# Patient Record
Sex: Female | Born: 1960 | Race: White | Hispanic: No | Marital: Married | Smoking: Current every day smoker
Health system: Southern US, Community
[De-identification: ages and names within clinical notes are randomized; demographics above are authoritative.]

## PROBLEM LIST (undated history)

## (undated) DIAGNOSIS — E039 Hypothyroidism, unspecified: Secondary | ICD-10-CM

## (undated) DIAGNOSIS — G5603 Carpal tunnel syndrome, bilateral upper limbs: Secondary | ICD-10-CM

## (undated) DIAGNOSIS — M1990 Unspecified osteoarthritis, unspecified site: Secondary | ICD-10-CM

## (undated) DIAGNOSIS — M8580 Other specified disorders of bone density and structure, unspecified site: Secondary | ICD-10-CM

## (undated) DIAGNOSIS — K760 Fatty (change of) liver, not elsewhere classified: Secondary | ICD-10-CM

## (undated) DIAGNOSIS — R519 Headache, unspecified: Secondary | ICD-10-CM

## (undated) DIAGNOSIS — Z923 Personal history of irradiation: Secondary | ICD-10-CM

## (undated) DIAGNOSIS — F419 Anxiety disorder, unspecified: Secondary | ICD-10-CM

## (undated) DIAGNOSIS — E785 Hyperlipidemia, unspecified: Secondary | ICD-10-CM

## (undated) DIAGNOSIS — E1142 Type 2 diabetes mellitus with diabetic polyneuropathy: Secondary | ICD-10-CM

## (undated) DIAGNOSIS — Z8489 Family history of other specified conditions: Secondary | ICD-10-CM

## (undated) DIAGNOSIS — I10 Essential (primary) hypertension: Secondary | ICD-10-CM

## (undated) DIAGNOSIS — G4730 Sleep apnea, unspecified: Secondary | ICD-10-CM

## (undated) DIAGNOSIS — K219 Gastro-esophageal reflux disease without esophagitis: Secondary | ICD-10-CM

## (undated) DIAGNOSIS — E1140 Type 2 diabetes mellitus with diabetic neuropathy, unspecified: Secondary | ICD-10-CM

## (undated) DIAGNOSIS — K2970 Gastritis, unspecified, without bleeding: Secondary | ICD-10-CM

## (undated) DIAGNOSIS — E119 Type 2 diabetes mellitus without complications: Secondary | ICD-10-CM

## (undated) DIAGNOSIS — R51 Headache: Secondary | ICD-10-CM

## (undated) DIAGNOSIS — C801 Malignant (primary) neoplasm, unspecified: Secondary | ICD-10-CM

## (undated) DIAGNOSIS — C73 Malignant neoplasm of thyroid gland: Secondary | ICD-10-CM

## (undated) DIAGNOSIS — G43909 Migraine, unspecified, not intractable, without status migrainosus: Secondary | ICD-10-CM

## (undated) HISTORY — PX: OTHER SURGICAL HISTORY: SHX169

## (undated) HISTORY — PX: TONSILLECTOMY: SUR1361

## (undated) HISTORY — PX: OOPHORECTOMY: SHX86

## (undated) HISTORY — PX: COLONOSCOPY: SHX174

## (undated) HISTORY — DX: Type 2 diabetes mellitus without complications: E11.9

## (undated) HISTORY — PX: THYROIDECTOMY: SHX17

## (undated) HISTORY — PX: NASAL SINUS SURGERY: SHX719

---

## 1999-03-31 ENCOUNTER — Other Ambulatory Visit: Admission: RE | Admit: 1999-03-31 | Discharge: 1999-03-31 | Payer: Self-pay | Admitting: *Deleted

## 2003-08-16 HISTORY — PX: ABDOMINAL HYSTERECTOMY: SHX81

## 2005-06-16 ENCOUNTER — Ambulatory Visit: Payer: Self-pay | Admitting: Family Medicine

## 2005-06-27 ENCOUNTER — Ambulatory Visit: Payer: Self-pay | Admitting: Family Medicine

## 2005-10-25 ENCOUNTER — Ambulatory Visit: Payer: Self-pay | Admitting: Psychiatry

## 2006-08-15 DIAGNOSIS — C73 Malignant neoplasm of thyroid gland: Secondary | ICD-10-CM

## 2006-08-15 HISTORY — DX: Malignant neoplasm of thyroid gland: C73

## 2007-05-22 ENCOUNTER — Ambulatory Visit: Payer: Self-pay | Admitting: Otolaryngology

## 2007-07-09 ENCOUNTER — Ambulatory Visit: Payer: Self-pay | Admitting: Otolaryngology

## 2007-08-13 ENCOUNTER — Ambulatory Visit: Payer: Self-pay | Admitting: Otolaryngology

## 2007-09-10 ENCOUNTER — Ambulatory Visit: Payer: Self-pay | Admitting: Otolaryngology

## 2007-09-14 ENCOUNTER — Ambulatory Visit: Payer: Self-pay | Admitting: Otolaryngology

## 2009-03-30 ENCOUNTER — Ambulatory Visit: Payer: Self-pay | Admitting: Gastroenterology

## 2009-05-04 ENCOUNTER — Ambulatory Visit: Payer: Self-pay | Admitting: Gastroenterology

## 2010-09-24 ENCOUNTER — Ambulatory Visit: Payer: Self-pay | Admitting: Otolaryngology

## 2010-10-08 ENCOUNTER — Ambulatory Visit: Payer: Self-pay | Admitting: Otolaryngology

## 2011-06-14 ENCOUNTER — Ambulatory Visit: Payer: Self-pay | Admitting: Gastroenterology

## 2011-06-16 LAB — PATHOLOGY REPORT

## 2011-10-20 ENCOUNTER — Ambulatory Visit: Payer: Self-pay | Admitting: Family Medicine

## 2012-12-06 ENCOUNTER — Ambulatory Visit: Payer: Self-pay | Admitting: Family Medicine

## 2013-08-28 ENCOUNTER — Emergency Department: Payer: Self-pay | Admitting: Emergency Medicine

## 2013-08-28 LAB — COMPREHENSIVE METABOLIC PANEL
Albumin: 4 g/dL (ref 3.4–5.0)
Alkaline Phosphatase: 71 U/L
Anion Gap: 4 — ABNORMAL LOW (ref 7–16)
BUN: 13 mg/dL (ref 7–18)
Bilirubin,Total: 0.4 mg/dL (ref 0.2–1.0)
Calcium, Total: 9.3 mg/dL (ref 8.5–10.1)
Chloride: 105 mmol/L (ref 98–107)
Co2: 28 mmol/L (ref 21–32)
Creatinine: 0.78 mg/dL (ref 0.60–1.30)
EGFR (African American): >60
EGFR (Non-African Amer.): >60
Glucose: 105 mg/dL — ABNORMAL HIGH (ref 65–99)
Osmolality: 274 (ref 275–301)
Potassium: 3.8 mmol/L (ref 3.5–5.1)
SGOT(AST): 29 U/L (ref 15–37)
SGPT (ALT): 45 U/L (ref 12–78)
Sodium: 137 mmol/L (ref 136–145)
Total Protein: 7.8 g/dL (ref 6.4–8.2)

## 2013-08-28 LAB — URINALYSIS, COMPLETE
Bacteria: NONE SEEN
Bilirubin,UR: NEGATIVE
Blood: NEGATIVE
Glucose,UR: NEGATIVE mg/dL (ref 0–75)
Ketone: NEGATIVE
Leukocyte Esterase: NEGATIVE
Nitrite: NEGATIVE
Ph: 7 (ref 4.5–8.0)
Protein: NEGATIVE
RBC,UR: 1 /HPF (ref 0–5)
Specific Gravity: 1.016 (ref 1.003–1.030)
Squamous Epithelial: 3
WBC UR: <1 /HPF (ref 0–5)

## 2013-08-28 LAB — CBC
HCT: 37.4 % (ref 35.0–47.0)
HGB: 12.7 g/dL (ref 12.0–16.0)
MCH: 30.3 pg (ref 26.0–34.0)
MCHC: 34 g/dL (ref 32.0–36.0)
MCV: 89 fL (ref 80–100)
Platelet: 217 10*3/uL (ref 150–440)
RBC: 4.2 10*6/uL (ref 3.80–5.20)
RDW: 15 % — ABNORMAL HIGH (ref 11.5–14.5)
WBC: 7 10*3/uL (ref 3.6–11.0)

## 2013-08-30 ENCOUNTER — Emergency Department: Payer: Self-pay | Admitting: Emergency Medicine

## 2013-10-24 ENCOUNTER — Other Ambulatory Visit: Payer: Self-pay | Admitting: Neurosurgery

## 2013-10-24 DIAGNOSIS — M545 Low back pain, unspecified: Secondary | ICD-10-CM

## 2013-11-22 ENCOUNTER — Ambulatory Visit
Admission: RE | Admit: 2013-11-22 | Discharge: 2013-11-22 | Disposition: A | Discharge status: Home / Self Care | Payer: BC Managed Care – PPO | Source: Ambulatory Visit | Attending: Neurosurgery | Admitting: Neurosurgery

## 2013-11-22 VITALS — BP 136/89 | HR 72

## 2013-11-22 DIAGNOSIS — M545 Low back pain, unspecified: Secondary | ICD-10-CM

## 2013-11-22 MED ORDER — DIAZEPAM 5 MG PO TABS
10.0000 mg | ORAL_TABLET | Freq: Once | ORAL | Status: AC
  Administered 2013-11-22: 10 mg via ORAL

## 2013-11-22 MED ORDER — IOHEXOL 180 MG/ML  SOLN
15.0000 mL | Freq: Once | INTRAMUSCULAR | Status: AC | PRN
  Administered 2013-11-22: 15 mL via INTRATHECAL

## 2013-11-22 NOTE — Progress Notes (Signed)
Pt states she had been off cymbalta since Tuesday. Discharge instructions explained to pt;

## 2013-11-22 NOTE — Discharge Instructions (Signed)
Myelogram Discharge Instructions  1. Go home and rest quietly for the next 24 hours.  It is important to lie flat for the next 24 hours.  Get up only to go to the restroom.  You may lie in the bed or on a couch on your back, your stomach, your left side or your right side.  You may have one pillow under your head.  You may have pillows between your knees while you are on your side or under your knees while you are on your back.  2. DO NOT drive today.  Recline the seat as far back as it will go, while still wearing your seat belt, on the way home.  3. You may get up to go to the bathroom as needed.  You may sit up for 10 minutes to eat.  You may resume your normal diet and medications unless otherwise indicated.  Drink lots of extra fluids today and tomorrow.  4. The incidence of headache, nausea, or vomiting is about 5% (one in 20 patients).  If you develop a headache, lie flat and drink plenty of fluids until the headache goes away.  Caffeinated beverages may be helpful.  If you develop severe nausea and vomiting or a headache that does not go away with flat bed rest, call [phone].  5. You may resume normal activities after your 24 hours of bed rest is over; however, do not exert yourself strongly or do any heavy lifting tomorrow. If when you get up you have a headache when standing, go back to bed and force fluids for another 24 hours.  6. Call your physician for a follow-up appointment.  The results of your myelogram will be sent directly to your physician by the following day.  7. If you have any questions or if complications develop after you arrive home, please call [phone].  Discharge instructions have been explained to the patient.  The patient, or the person responsible for the patient, fully understands these instructions.      May resume cymbalta on November 23, 2013, after 11:00 am.

## 2014-02-13 ENCOUNTER — Ambulatory Visit: Payer: Self-pay | Admitting: Family Medicine

## 2015-02-17 ENCOUNTER — Other Ambulatory Visit: Payer: Self-pay | Admitting: Family Medicine

## 2015-02-17 DIAGNOSIS — Z1231 Encounter for screening mammogram for malignant neoplasm of breast: Secondary | ICD-10-CM

## 2015-03-16 ENCOUNTER — Ambulatory Visit
Admission: RE | Admit: 2015-03-16 | Discharge: 2015-03-16 | Disposition: A | Discharge status: Home / Self Care | Payer: BLUE CROSS/BLUE SHIELD | Source: Ambulatory Visit | Attending: Family Medicine | Admitting: Family Medicine

## 2015-03-16 DIAGNOSIS — Z1231 Encounter for screening mammogram for malignant neoplasm of breast: Secondary | ICD-10-CM

## 2015-03-16 HISTORY — DX: Personal history of irradiation: Z92.3

## 2015-03-16 HISTORY — DX: Malignant (primary) neoplasm, unspecified: C80.1

## 2015-09-08 ENCOUNTER — Encounter: Payer: Self-pay | Admitting: *Deleted

## 2015-09-17 ENCOUNTER — Encounter: Payer: Self-pay | Admitting: General Surgery

## 2015-09-17 ENCOUNTER — Ambulatory Visit (INDEPENDENT_AMBULATORY_CARE_PROVIDER_SITE_OTHER): Payer: BLUE CROSS/BLUE SHIELD | Admitting: General Surgery

## 2015-09-17 VITALS — BP 130/84 | HR 80 | Resp 14 | Ht 66.0 in | Wt 230.0 lb

## 2015-09-17 DIAGNOSIS — K6289 Other specified diseases of anus and rectum: Secondary | ICD-10-CM | POA: Diagnosis not present

## 2015-09-17 DIAGNOSIS — M7918 Myalgia, other site: Secondary | ICD-10-CM

## 2015-09-17 DIAGNOSIS — M791 Myalgia: Secondary | ICD-10-CM | POA: Diagnosis not present

## 2015-09-17 LAB — POC HEMOCCULT BLD/STL (OFFICE/1-CARD/DIAGNOSTIC)
Card #1 Date: NEGATIVE
Fecal Occult Blood, POC: NEGATIVE

## 2015-09-17 NOTE — Patient Instructions (Signed)
Follow up appointment to be announced.  

## 2015-09-17 NOTE — Progress Notes (Signed)
Patient ID: Sherri Dickerson, female   DOB: 1961/04/07, 55 y.o.   MRN: [id number]  Chief Complaint  Patient presents with  . Other    hemorrhoids    HPI Sherri Dickerson is a 55 y.o. female.  She states she is having rectal/gluteal pain but no bleeding.  Movements are occurring daily with help of miralax. She states she has had pain on the left side of the rectum on a near constant basis for the past month. She did not find that the location of the pain correlated with what she thought was an external hemorrhoid. While she's had trouble with hemorrhoids in the past, she has had no recent bleeding. She has tried preparation H as well as Desitin cream without relief.  The patient reports that she has pain with long periods of sitting. There is no radiation from the left gluteal area, adjacent to the perianal tissue during these episodes. She describes the pain is being that of burning or a scalded sensation. She does not find that bowel movements affect the pain. During that month that this pain has been present she has attempted intercourse on only one occurrence and found that this was uncomfortable, although the discomfort was not within the vagina.  The patient denies previous similar episodes of pain in this area, although she does have a history of back problems. She has undergone epidural narcotic administration in the past, most recently in November 2016 when she underwent an L5/S1 injection for radicular pain and back pain. The present left gluteal discomfort occurred shortly after, but not immediately after this injection patient reports her back pain improved but the radicular pain to the lateral inferior aspect of the thigh was not improved.   I personally reviewed the patient's history. HPI  Past Medical History  Diagnosis Date  . Status post radiation therapy     thyroid  . Diabetes mellitus without complication ([place])   . Cancer [facility]) 2012?    thyroid    Past Surgical History   Procedure Laterality Date  . Thyroidectomy  2012?    Dr Kathyrn Sheriff  . Colonoscopy  2013?    Dr Donnella Sham  . Abdominal hysterectomy  2005    Family History  Problem Relation Age of Onset  . Breast cancer Neg Hx     Social History Social History  Substance Use Topics  . Smoking status: Current Some Day Smoker    Types: Cigarettes  . Smokeless tobacco: Never Used     Comment: only 2-3 cigarettes a year.  . Alcohol Use: 0.0 oz/week    0 Standard drinks or equivalent per week    Allergies  Allergen Reactions  . Morphine And Related     Current Outpatient Prescriptions  Medication Sig Dispense Refill  . aspirin EC 81 MG tablet Take by mouth.    Marland Kitchen atorvastatin (LIPITOR) 40 MG tablet TK 1 T PO QPM  0  . Blood Glucose Monitoring Suppl (FIFTY50 GLUCOSE METER 2.0) w/Device KIT Check fasting sugar once daily.  E11.9    . Cyanocobalamin (RA VITAMIN B-12 TR) 1000 MCG TBCR Take by mouth.    . diazepam (VALIUM) 5 MG tablet Take by mouth.    . gabapentin (NEURONTIN) 300 MG capsule TK ONE C PO  QAM AND 2 CS Q NIGHT  11  . lisinopril-hydrochlorothiazide (PRINZIDE,ZESTORETIC) 10-12.5 MG tablet TK 1 T PO QD  4  . metFORMIN (GLUCOPHAGE) 500 MG tablet   12  . Multiple Vitamin (MULTI-VITAMINS) TABS Take  by mouth.    . naproxen (NAPROSYN) 500 MG tablet 1 po bid with food and water when needed.  Do not take daily.    Marland Kitchen oxyCODONE-acetaminophen (PERCOCET/ROXICET) 5-325 MG tablet Take by mouth.    . RABEprazole (ACIPHEX) 20 MG tablet   0  . SYNTHROID 125 MCG tablet   4  . venlafaxine XR (EFFEXOR-XR) 150 MG 24 hr capsule   11  . Vitamin D, Ergocalciferol, (DRISDOL) 50000 units CAPS capsule Take by mouth.     No current facility-administered medications for this visit.    Review of Systems Review of Systems  Constitutional: Negative.   Respiratory: Negative.   Cardiovascular: Negative.   Gastrointestinal: Positive for constipation. Negative for nausea, diarrhea and blood in stool.    Blood  pressure 130/84, pulse 80, resp. rate 14, height _0  (1.676 m), weight 230 lb (104.327 kg).  Physical Exam Physical Exam  Constitutional: She is oriented to person, place, and time. She appears well-developed and well-nourished.  HENT:  Mouth/Throat: Oropharynx is clear and moist.  Eyes: Conjunctivae are normal. No scleral icterus.  Neck: Neck supple.  Cardiovascular: Normal rate, regular rhythm and normal heart sounds.   Pulmonary/Chest: Effort normal and breath sounds normal.  Genitourinary: Rectal exam shows no fissure, no mass, no tenderness and anal tone normal. Guaiac negative stool.     Anterior anal tag  Lymphadenopathy:    She has no cervical adenopathy.  Neurological: She is alert and oriented to person, place, and time.  Skin: Skin is warm and dry.  Psychiatric: Her behavior is normal.    Data Reviewed The 06/16/2015 epidural injection note by Sharlet Salina, D.O. was reviewed.  Office notes from September 2016 to the present were reviewed. No mention is made of perianal pain  Anoscopy was completed: No abnormality of the visualized lower rectal mucosa. Small, 2 mm internal hemorrhoids noted without bleeding. External skin tag anteriorly without irritation or bleeding.    Assessment    Gluteal pain/hyper-sensitivity without obvious etiology.     Plan    The patient's report of a burning sensation would suggest nerve irritation, and in this location would be in the S3/-4 dermatome. No maneuvers during today's exam elicited any objective physical findings. While the patient reported the pain developed shortly after her epidural, it's difficult to explain how this would've had any effect on these lower nerve roots.    I will review this case with my colleagues and notify the patient if any additional maneuvers can be suggested.   Patient to return as needed PCP: Dr Erle Crocker This information has been scribed by Karie Fetch RNBC.  Robert Bellow 09/18/2015,  1:27 PM

## 2015-09-18 DIAGNOSIS — M7918 Myalgia, other site: Secondary | ICD-10-CM | POA: Insufficient documentation

## 2015-09-18 DIAGNOSIS — K6289 Other specified diseases of anus and rectum: Secondary | ICD-10-CM | POA: Insufficient documentation

## 2015-09-24 ENCOUNTER — Telehealth: Payer: Self-pay | Admitting: *Deleted

## 2015-09-24 NOTE — Telephone Encounter (Signed)
-----   Message from Robert Bellow, MD sent at 09/24/2015  4:32 PM EST ----- These notify the patient that I reviewed her case with another physician. I really don't have any good idea why she is having the burning discomfort on the perineum. This would suggest possible nerve irritation, it's difficult to correlate this with the spinal steroid injection that had occurred shortly before onset. I would encourage her to touch base with her pain management specialist as I see no primary pathology involving the anus, rectum or perineum. Thank you

## 2015-09-25 NOTE — Telephone Encounter (Signed)
Notified patient as instructed, patient pleased., patient agrees  

## 2015-10-12 ENCOUNTER — Other Ambulatory Visit: Payer: Self-pay | Admitting: Physical Medicine and Rehabilitation

## 2015-10-12 DIAGNOSIS — M5416 Radiculopathy, lumbar region: Secondary | ICD-10-CM

## 2015-10-28 ENCOUNTER — Ambulatory Visit
Admission: RE | Admit: 2015-10-28 | Discharge: 2015-10-28 | Disposition: A | Discharge status: Home / Self Care | Payer: BLUE CROSS/BLUE SHIELD | Source: Ambulatory Visit | Attending: Physical Medicine and Rehabilitation | Admitting: Physical Medicine and Rehabilitation

## 2015-10-28 DIAGNOSIS — M4806 Spinal stenosis, lumbar region: Secondary | ICD-10-CM | POA: Insufficient documentation

## 2015-10-28 DIAGNOSIS — M5416 Radiculopathy, lumbar region: Secondary | ICD-10-CM

## 2016-03-29 ENCOUNTER — Other Ambulatory Visit: Payer: Self-pay | Admitting: Family Medicine

## 2016-03-29 DIAGNOSIS — Z1231 Encounter for screening mammogram for malignant neoplasm of breast: Secondary | ICD-10-CM

## 2016-04-13 ENCOUNTER — Ambulatory Visit: Payer: BLUE CROSS/BLUE SHIELD | Attending: Family Medicine

## 2016-07-19 ENCOUNTER — Ambulatory Visit
Admission: RE | Admit: 2016-07-19 | Discharge: 2016-07-19 | Disposition: A | Discharge status: Home / Self Care | Payer: BLUE CROSS/BLUE SHIELD | Source: Ambulatory Visit | Attending: Neurosurgery | Admitting: Neurosurgery

## 2016-07-19 ENCOUNTER — Encounter
Admission: RE | Admit: 2016-07-19 | Discharge: 2016-07-19 | Disposition: A | Discharge status: Home / Self Care | Payer: BLUE CROSS/BLUE SHIELD | Source: Ambulatory Visit | Attending: Neurosurgery | Admitting: Neurosurgery

## 2016-07-19 DIAGNOSIS — Z01818 Encounter for other preprocedural examination: Secondary | ICD-10-CM | POA: Insufficient documentation

## 2016-07-19 DIAGNOSIS — E119 Type 2 diabetes mellitus without complications: Secondary | ICD-10-CM | POA: Insufficient documentation

## 2016-07-19 DIAGNOSIS — I10 Essential (primary) hypertension: Secondary | ICD-10-CM | POA: Insufficient documentation

## 2016-07-19 DIAGNOSIS — Z01810 Encounter for preprocedural cardiovascular examination: Secondary | ICD-10-CM | POA: Insufficient documentation

## 2016-07-19 DIAGNOSIS — R9431 Abnormal electrocardiogram [ECG] [EKG]: Secondary | ICD-10-CM | POA: Diagnosis not present

## 2016-07-19 HISTORY — DX: Hypothyroidism, unspecified: E03.9

## 2016-07-19 HISTORY — DX: Essential (primary) hypertension: I10

## 2016-07-19 HISTORY — DX: Headache, unspecified: R51.9

## 2016-07-19 HISTORY — DX: Sleep apnea, unspecified: G47.30

## 2016-07-19 HISTORY — DX: Headache: R51

## 2016-07-19 HISTORY — DX: Hyperlipidemia, unspecified: E78.5

## 2016-07-19 HISTORY — DX: Gastritis, unspecified, without bleeding: K29.70

## 2016-07-19 HISTORY — DX: Unspecified osteoarthritis, unspecified site: M19.90

## 2016-07-19 HISTORY — DX: Anxiety disorder, unspecified: F41.9

## 2016-07-19 LAB — CBC
HCT: 37.3 % (ref 35.0–47.0)
Hemoglobin: 12.5 g/dL (ref 12.0–16.0)
MCH: 32 pg (ref 26.0–34.0)
MCHC: 33.6 g/dL (ref 32.0–36.0)
MCV: 95.1 fL (ref 80.0–100.0)
Platelets: 246 10*3/uL (ref 150–440)
RBC: 3.92 MIL/uL (ref 3.80–5.20)
RDW: 15.5 % — ABNORMAL HIGH (ref 11.5–14.5)
WBC: 8.7 10*3/uL (ref 3.6–11.0)

## 2016-07-19 LAB — PROTIME-INR
INR: 0.92
Prothrombin Time: 12.4 seconds (ref 11.4–15.2)

## 2016-07-19 LAB — DIFFERENTIAL
Basophils Absolute: 0 10*3/uL (ref 0–0.1)
Basophils Relative: 0 %
Eosinophils Absolute: 0.1 10*3/uL (ref 0–0.7)
Eosinophils Relative: 2 %
Lymphocytes Relative: 39 %
Lymphs Abs: 3.4 10*3/uL (ref 1.0–3.6)
Monocytes Absolute: 0.6 10*3/uL (ref 0.2–0.9)
Monocytes Relative: 7 %
Neutro Abs: 4.6 10*3/uL (ref 1.4–6.5)
Neutrophils Relative %: 52 %

## 2016-07-19 LAB — URINALYSIS, ROUTINE W REFLEX MICROSCOPIC
Bacteria, UA: NONE SEEN
Bilirubin Urine: NEGATIVE
Glucose, UA: NEGATIVE mg/dL
Hgb urine dipstick: NEGATIVE
Ketones, ur: NEGATIVE mg/dL
Nitrite: NEGATIVE
Protein, ur: NEGATIVE mg/dL
RBC / HPF: NONE SEEN RBC/hpf (ref 0–5)
Specific Gravity, Urine: 1.02 (ref 1.005–1.030)
pH: 5 (ref 5.0–8.0)

## 2016-07-19 LAB — BASIC METABOLIC PANEL
Anion gap: 7 (ref 5–15)
BUN: 14 mg/dL (ref 6–20)
CO2: 29 mmol/L (ref 22–32)
Calcium: 9.7 mg/dL (ref 8.9–10.3)
Chloride: 105 mmol/L (ref 101–111)
Creatinine, Ser: 0.69 mg/dL (ref 0.44–1.00)
GFR calc Af Amer: >60 mL/min (ref >60)
GFR calc non Af Amer: >60 mL/min (ref >60)
Glucose, Bld: 102 mg/dL — ABNORMAL HIGH (ref 65–99)
Potassium: 3.8 mmol/L (ref 3.5–5.1)
Sodium: 141 mmol/L (ref 135–145)

## 2016-07-19 LAB — SURGICAL PCR SCREEN
MRSA, PCR: NEGATIVE
Staphylococcus aureus: POSITIVE — AB

## 2016-07-19 LAB — APTT: aPTT: 25 seconds (ref 24–36)

## 2016-07-19 NOTE — Patient Instructions (Signed)
  Your procedure is scheduled [id number] Dec. 11 , 2017. Report to Same Day Surgery. To find out your arrival time please call [phone] between 1PM - 3PM on Friday Dec.8, 2017 .  Remember: Instructions that are not followed completely may result in serious medical risk, up to and including death, or upon the discretion of your surgeon and anesthesiologist your surgery may need to be rescheduled.    _x___ 1. Do not eat food or drink liquids after midnight. No gum chewing or hard candies.     _x___ 2. No Alcohol for 24 hours before or after surgery.   ____ 3. Bring all medications with you on the day of surgery if instructed.    __x__ 4. Notify your doctor if there is any change in your medical condition     (cold, fever, infections).    __x___ 5. No smoking 24 hours prior to surgery.     Do not wear jewelry, make-up, hairpins, clips or nail polish.  Do not wear lotions, powders, or perfumes.   Do not shave 48 hours prior to surgery. Men may shave face and neck.  Do not bring valuables to the hospital.    [facility] is not responsible for any belongings or valuables.               Contacts, dentures or bridgework may not be worn into surgery.  Leave your suitcase in the car. After surgery it may be brought to your room.  For patients admitted to the hospital, discharge time is determined by your treatment team.   Patients discharged the day of surgery will not be allowed to drive home.    Please read over the following fact sheets that you were given:   [facility] Preparing for Surgery  _x___ Take these medicines the morning of surgery with A SIP OF WATER:    1. gabapentin (NEURONTIN)  2. RABEprazole (ACIPHEX)   3. SYNTHROID  4. venlafaxine XR (EFFEXOR-XR)  5. oxyCODONE-acetaminophen (PERCOCET/ROXICET)  optional    ____ Fleet Enema (as directed)   _x___ Use CHG Soap as directed on instruction sheet  ____ Use inhalers on the day of surgery and bring to hospital day of  surgery  _x___ Stop metformin 2 days prior to surgery    ____ Take 1/2 of usual insulin dose the night before surgery and none on the morning of surgery.   _x___ Stop aspirin per Dr. Jonathon Jordan instructions.  _x___ Stop Anti-inflammatories such as Advil, Aleve, Ibuprofen, Motrin, Naproxen, Naprosyn, Goodies powders or aspirin products. OK to take Tylenol or Percocet.   ____ Stop supplements: Cyanocobalamin (RA VITAMIN B-12 TR)  until after surgery.    _x___ Bring C-Pap to the hospital.

## 2016-07-20 NOTE — Pre-Procedure Instructions (Signed)
Urine analysis and positive staph aureus sent to Dr. Lacinda Axon for review.  Asked if Dr. Lacinda Axon wanted any treatment for positive staph aureus?

## 2016-07-24 MED ORDER — VANCOMYCIN HCL IN DEXTROSE 1-5 GM/200ML-% IV SOLN
1000.0000 mg | Freq: Once | INTRAVENOUS | Status: AC
  Administered 2016-07-25: 1000 mg via INTRAVENOUS

## 2016-07-25 ENCOUNTER — Ambulatory Visit
Admission: RE | Admit: 2016-07-25 | Discharge: 2016-07-25 | Disposition: A | Discharge status: Home / Self Care | Payer: BLUE CROSS/BLUE SHIELD | Source: Ambulatory Visit | Attending: Neurosurgery | Admitting: Neurosurgery

## 2016-07-25 ENCOUNTER — Ambulatory Visit: Payer: BLUE CROSS/BLUE SHIELD

## 2016-07-25 ENCOUNTER — Encounter
Admission: RE | Disposition: A | Discharge status: Home / Self Care | Payer: Self-pay | Source: Ambulatory Visit | Attending: Neurosurgery

## 2016-07-25 ENCOUNTER — Ambulatory Visit: Payer: BLUE CROSS/BLUE SHIELD | Admitting: Anesthesiology

## 2016-07-25 ENCOUNTER — Encounter: Payer: Self-pay | Admitting: *Deleted

## 2016-07-25 DIAGNOSIS — F419 Anxiety disorder, unspecified: Secondary | ICD-10-CM | POA: Insufficient documentation

## 2016-07-25 DIAGNOSIS — F17210 Nicotine dependence, cigarettes, uncomplicated: Secondary | ICD-10-CM | POA: Insufficient documentation

## 2016-07-25 DIAGNOSIS — K219 Gastro-esophageal reflux disease without esophagitis: Secondary | ICD-10-CM | POA: Diagnosis not present

## 2016-07-25 DIAGNOSIS — M48061 Spinal stenosis, lumbar region without neurogenic claudication: Secondary | ICD-10-CM | POA: Diagnosis present

## 2016-07-25 DIAGNOSIS — E669 Obesity, unspecified: Secondary | ICD-10-CM | POA: Insufficient documentation

## 2016-07-25 DIAGNOSIS — Z85850 Personal history of malignant neoplasm of thyroid: Secondary | ICD-10-CM | POA: Insufficient documentation

## 2016-07-25 DIAGNOSIS — E785 Hyperlipidemia, unspecified: Secondary | ICD-10-CM | POA: Diagnosis not present

## 2016-07-25 DIAGNOSIS — Z419 Encounter for procedure for purposes other than remedying health state, unspecified: Secondary | ICD-10-CM

## 2016-07-25 DIAGNOSIS — E119 Type 2 diabetes mellitus without complications: Secondary | ICD-10-CM | POA: Diagnosis not present

## 2016-07-25 DIAGNOSIS — G4730 Sleep apnea, unspecified: Secondary | ICD-10-CM | POA: Diagnosis not present

## 2016-07-25 DIAGNOSIS — M4726 Other spondylosis with radiculopathy, lumbar region: Secondary | ICD-10-CM | POA: Insufficient documentation

## 2016-07-25 DIAGNOSIS — Z6836 Body mass index (BMI) 36.0-36.9, adult: Secondary | ICD-10-CM | POA: Insufficient documentation

## 2016-07-25 DIAGNOSIS — I10 Essential (primary) hypertension: Secondary | ICD-10-CM | POA: Insufficient documentation

## 2016-07-25 HISTORY — PX: FORAMINOTOMY 2 LEVEL: SHX5836

## 2016-07-25 HISTORY — PX: LUMBAR LAMINECTOMY/DECOMPRESSION MICRODISCECTOMY: SHX5026

## 2016-07-25 LAB — GLUCOSE, CAPILLARY: Glucose-Capillary: 128 mg/dL — ABNORMAL HIGH (ref 65–99)

## 2016-07-25 SURGERY — LUMBAR LAMINECTOMY/DECOMPRESSION MICRODISCECTOMY 2 LEVELS
Anesthesia: General | Site: Back | Laterality: Left | Wound class: Clean

## 2016-07-25 MED ORDER — DEXAMETHASONE SODIUM PHOSPHATE 10 MG/ML IJ SOLN
INTRAMUSCULAR | Status: DC | PRN
  Administered 2016-07-25: 8 mg via INTRAVENOUS

## 2016-07-25 MED ORDER — FENTANYL CITRATE (PF) 100 MCG/2ML IJ SOLN
25.0000 ug | INTRAMUSCULAR | Status: AC | PRN
  Administered 2016-07-25 (×6): 25 ug via INTRAVENOUS

## 2016-07-25 MED ORDER — ONDANSETRON HCL 4 MG/2ML IJ SOLN
4.0000 mg | Freq: Once | INTRAMUSCULAR | Status: AC | PRN
  Administered 2016-07-25: 4 mg via INTRAVENOUS

## 2016-07-25 MED ORDER — ONDANSETRON HCL 4 MG/2ML IJ SOLN
INTRAMUSCULAR | Status: AC
  Filled 2016-07-25: qty 2

## 2016-07-25 MED ORDER — SODIUM CHLORIDE 0.9 % IV SOLN
INTRAVENOUS | Status: DC | PRN
  Administered 2016-07-25: 12:00:00 via INTRAVENOUS

## 2016-07-25 MED ORDER — SODIUM CHLORIDE 0.9 % IJ SOLN
INTRAMUSCULAR | Status: AC
  Filled 2016-07-25: qty 10

## 2016-07-25 MED ORDER — ACETAMINOPHEN 10 MG/ML IV SOLN
INTRAVENOUS | Status: DC | PRN
  Administered 2016-07-25: 1000 mg via INTRAVENOUS

## 2016-07-25 MED ORDER — ROCURONIUM BROMIDE 100 MG/10ML IV SOLN
INTRAVENOUS | Status: DC | PRN
  Administered 2016-07-25: 50 mg via INTRAVENOUS

## 2016-07-25 MED ORDER — FENTANYL CITRATE (PF) 100 MCG/2ML IJ SOLN
INTRAMUSCULAR | Status: AC
  Filled 2016-07-25: qty 2

## 2016-07-25 MED ORDER — BACITRACIN 50000 UNITS IM SOLR
INTRAMUSCULAR | Status: AC
  Filled 2016-07-25: qty 1

## 2016-07-25 MED ORDER — ONDANSETRON HCL 4 MG/2ML IJ SOLN
INTRAMUSCULAR | Status: DC | PRN
  Administered 2016-07-25: 4 mg via INTRAVENOUS

## 2016-07-25 MED ORDER — FENTANYL CITRATE (PF) 100 MCG/2ML IJ SOLN
INTRAMUSCULAR | Status: DC | PRN
  Administered 2016-07-25 (×2): 50 ug via INTRAVENOUS
  Administered 2016-07-25: 75 ug via INTRAVENOUS

## 2016-07-25 MED ORDER — PHENYLEPHRINE HCL 10 MG/ML IJ SOLN
INTRAMUSCULAR | Status: DC | PRN
  Administered 2016-07-25 (×4): 100 ug via INTRAVENOUS

## 2016-07-25 MED ORDER — OXYCODONE-ACETAMINOPHEN 5-325 MG PO TABS
1.0000 | ORAL_TABLET | Freq: Once | ORAL | Status: AC
  Administered 2016-07-25: 1 via ORAL

## 2016-07-25 MED ORDER — METHYLPREDNISOLONE ACETATE 40 MG/ML IJ SUSP
INTRAMUSCULAR | Status: AC
  Filled 2016-07-25: qty 1

## 2016-07-25 MED ORDER — SUGAMMADEX SODIUM 200 MG/2ML IV SOLN
INTRAVENOUS | Status: DC | PRN
  Administered 2016-07-25: 200 mg via INTRAVENOUS

## 2016-07-25 MED ORDER — PROPOFOL 10 MG/ML IV BOLUS
INTRAVENOUS | Status: DC | PRN
  Administered 2016-07-25: 150 mg via INTRAVENOUS

## 2016-07-25 MED ORDER — OXYCODONE-ACETAMINOPHEN 5-325 MG PO TABS
ORAL_TABLET | ORAL | Status: AC
  Filled 2016-07-25: qty 1

## 2016-07-25 MED ORDER — SODIUM CHLORIDE 0.9 % IJ SOLN
INTRAMUSCULAR | Status: AC
  Filled 2016-07-25: qty 50

## 2016-07-25 MED ORDER — SUCCINYLCHOLINE CHLORIDE 20 MG/ML IJ SOLN
INTRAMUSCULAR | Status: DC | PRN
  Administered 2016-07-25: 100 mg via INTRAVENOUS

## 2016-07-25 MED ORDER — SODIUM CHLORIDE 0.9 % IR SOLN
Status: DC | PRN
  Administered 2016-07-25: 1000 mL

## 2016-07-25 MED ORDER — SODIUM CHLORIDE 0.9 % IV SOLN
INTRAVENOUS | Status: DC
  Administered 2016-07-25: 09:00:00 via INTRAVENOUS

## 2016-07-25 MED ORDER — LIDOCAINE-EPINEPHRINE 1 %-1:100000 IJ SOLN
INTRAMUSCULAR | Status: AC
  Filled 2016-07-25: qty 1

## 2016-07-25 MED ORDER — THROMBIN 5000 UNITS EX KIT
PACK | CUTANEOUS | Status: DC | PRN
  Administered 2016-07-25: 5000 [IU] via TOPICAL

## 2016-07-25 MED ORDER — THROMBIN 5000 UNITS EX SOLR
CUTANEOUS | Status: AC
  Filled 2016-07-25: qty 10000

## 2016-07-25 MED ORDER — GELATIN ABSORBABLE 12-7 MM EX MISC
CUTANEOUS | Status: DC | PRN
  Administered 2016-07-25: 1

## 2016-07-25 MED ORDER — METHYLPREDNISOLONE ACETATE 40 MG/ML IJ SUSP
INTRAMUSCULAR | Status: DC | PRN
Start: 2016-07-25 — End: 2016-07-25
  Administered 2016-07-25: 40 mg

## 2016-07-25 MED ORDER — GELATIN ABSORBABLE 12-7 MM EX MISC
CUTANEOUS | Status: AC
  Filled 2016-07-25: qty 1

## 2016-07-25 MED ORDER — LIDOCAINE-EPINEPHRINE 1 %-1:100000 IJ SOLN
INTRAMUSCULAR | Status: DC | PRN
  Administered 2016-07-25: 20 mL

## 2016-07-25 MED ORDER — METHOCARBAMOL 500 MG PO TABS: 500.0000 mg | ORAL_TABLET | Freq: Four times a day (QID) | ORAL | 1 refills | Status: DC

## 2016-07-25 MED ORDER — BUPIVACAINE LIPOSOME 1.3 % IJ SUSP
INTRAMUSCULAR | Status: AC
  Filled 2016-07-25: qty 20

## 2016-07-25 MED ORDER — ACETAMINOPHEN 10 MG/ML IV SOLN
INTRAVENOUS | Status: AC
  Filled 2016-07-25: qty 100

## 2016-07-25 MED ORDER — LIDOCAINE HCL (CARDIAC) 20 MG/ML IV SOLN
INTRAVENOUS | Status: DC | PRN
  Administered 2016-07-25: 80 mg via INTRAVENOUS

## 2016-07-25 MED ORDER — OXYCODONE-ACETAMINOPHEN 5-325 MG PO TABS: 1.0000 | ORAL_TABLET | ORAL | 0 refills | Status: DC | PRN

## 2016-07-25 SURGICAL SUPPLY — 70 items
BAND RUBBER 3X1/6 TAN STRL (MISCELLANEOUS) IMPLANT
BLADE BOVIE TIP EXT 4 (BLADE) ×4 IMPLANT
BRUSH SCRUB EZ  4% CHG (MISCELLANEOUS)
BRUSH SCRUB EZ 4% CHG (MISCELLANEOUS) IMPLANT
BUR NEURO DRILL SOFT 3.0X3.8M (BURR) ×4 IMPLANT
CANISTER SUCT 1200ML W/VALVE (MISCELLANEOUS) ×4 IMPLANT
CHLORAPREP W/TINT 26ML (MISCELLANEOUS) ×8 IMPLANT
CLOSURE WOUND 1/2 X4 (GAUZE/BANDAGES/DRESSINGS)
CNTNR SPEC 2.5X3XGRAD LEK (MISCELLANEOUS) ×2
CONT SPEC 4OZ STER OR WHT (MISCELLANEOUS) ×2
CONTAINER SPEC 2.5X3XGRAD LEK (MISCELLANEOUS) ×2 IMPLANT
COUNTER NEEDLE 20/40 LG (NEEDLE) ×4 IMPLANT
COVER LIGHT HANDLE STERIS (MISCELLANEOUS) ×8 IMPLANT
CUP MEDICINE 2OZ PLAST GRAD ST (MISCELLANEOUS) ×4 IMPLANT
DERMABOND ADVANCED (GAUZE/BANDAGES/DRESSINGS) ×2
DERMABOND ADVANCED .7 DNX12 (GAUZE/BANDAGES/DRESSINGS) ×2 IMPLANT
DRAPE C-ARM XRAY 36X54 (DRAPES) ×8 IMPLANT
DRAPE C-ARMOR (DRAPES) IMPLANT
DRAPE LAPAROTOMY 100X77 ABD (DRAPES) ×4 IMPLANT
DRAPE MICROSCOPE LEICA (MISCELLANEOUS) ×4 IMPLANT
DRAPE POUCH INSTRU U-SHP 10X18 (DRAPES) ×4 IMPLANT
DRAPE SURG 17X11 SM STRL (DRAPES) ×4 IMPLANT
DRAPE TABLE BACK 80X90 (DRAPES) IMPLANT
DRESSING TELFA 4X3 1S ST N-ADH (GAUZE/BANDAGES/DRESSINGS) IMPLANT
DRSG OPSITE POSTOP 4X6 (GAUZE/BANDAGES/DRESSINGS) ×4 IMPLANT
DRSG TEGADERM 4X4.75 (GAUZE/BANDAGES/DRESSINGS) IMPLANT
DURASEAL APPLICATOR TIP (TIP) IMPLANT
DURASEAL SPINE SEALANT 3ML (MISCELLANEOUS) ×4 IMPLANT
ELECT CAUTERY BLADE TIP 2.5 (TIP) ×4
ELECT EZSTD 165MM 6.5IN (MISCELLANEOUS) ×4
ELECT REM PT RETURN 9FT ADLT (ELECTROSURGICAL) ×4
ELECTRODE CAUTERY BLDE TIP 2.5 (TIP) ×2 IMPLANT
ELECTRODE EZSTD 165MM 6.5IN (MISCELLANEOUS) ×2 IMPLANT
ELECTRODE REM PT RTRN 9FT ADLT (ELECTROSURGICAL) ×2 IMPLANT
GAUZE SPONGE 4X4 12PLY STRL (GAUZE/BANDAGES/DRESSINGS) IMPLANT
GLOVE BIOGEL PI IND STRL 8 (GLOVE) ×2 IMPLANT
GLOVE BIOGEL PI INDICATOR 8 (GLOVE) ×2
GLOVE SURG SYN 8.0 (GLOVE) ×8 IMPLANT
GOWN STRL REUS W/ TWL LRG LVL3 (GOWN DISPOSABLE) ×2 IMPLANT
GOWN STRL REUS W/ TWL XL LVL3 (GOWN DISPOSABLE) ×2 IMPLANT
GOWN STRL REUS W/TWL LRG LVL3 (GOWN DISPOSABLE) ×2
GOWN STRL REUS W/TWL XL LVL3 (GOWN DISPOSABLE) ×2
GRADUATE 1200CC STRL 31836 (MISCELLANEOUS) ×4 IMPLANT
IV CATH ANGIO 12GX3 LT BLUE (NEEDLE) ×4 IMPLANT
KIT RM TURNOVER STRD PROC AR (KITS) ×4 IMPLANT
KIT WILSON FRAME (KITS) ×4 IMPLANT
MARKER SKIN DUAL TIP RULER LAB (MISCELLANEOUS) ×8 IMPLANT
NDL SAFETY ECLIPSE 18X1.5 (NEEDLE) ×2 IMPLANT
NEEDLE HYPO 18GX1.5 SHARP (NEEDLE) ×2
NEEDLE HYPO 22GX1.5 SAFETY (NEEDLE) ×8 IMPLANT
NS IRRIG 1000ML POUR BTL (IV SOLUTION) ×4 IMPLANT
PACK LAMINECTOMY NEURO (CUSTOM PROCEDURE TRAY) ×4 IMPLANT
PAD ARMBOARD 7.5X6 YLW CONV (MISCELLANEOUS) ×4 IMPLANT
SPOGE SURGIFLO 8M (HEMOSTASIS) ×2
SPONGE SURGIFLO 8M (HEMOSTASIS) ×2 IMPLANT
STAPLER SKIN PROX 35W (STAPLE) IMPLANT
STRIP CLOSURE SKIN 1/2X4 (GAUZE/BANDAGES/DRESSINGS) IMPLANT
SUT NURALON 4 0 TR CR/8 (SUTURE) IMPLANT
SUT PROLENE 5 0 RB 1 DA (SUTURE) ×4 IMPLANT
SUT VIC AB 0 CT1 18XCR BRD 8 (SUTURE) ×4 IMPLANT
SUT VIC AB 0 CT1 8-18 (SUTURE) ×4
SUT VIC AB 2-0 CT1 18 (SUTURE) ×4 IMPLANT
SUT VIC AB 3-0 SH 8-18 (SUTURE) ×8 IMPLANT
SYR 20CC LL (SYRINGE) ×4 IMPLANT
SYR 30ML LL (SYRINGE) ×8 IMPLANT
SYRINGE 10CC LL (SYRINGE) ×8 IMPLANT
TOWEL OR 17X26 4PK STRL BLUE (TOWEL DISPOSABLE) ×8 IMPLANT
TRAY FOLEY W/METER SILVER 16FR (SET/KITS/TRAYS/PACK) IMPLANT
TUBING CONNECTING 10 (TUBING) ×3 IMPLANT
TUBING CONNECTING 10' (TUBING) ×1

## 2016-07-25 NOTE — H&P (Signed)
History & Physical   Date of Service: 07/11/2016     Time: 12:32 PM  Chief Complaint: Left leg pain  History of Present Illness: Sherri Dickerson is a 55 year old female with ongoing back and left leg pain.  She states back pain has been ongoing for over a decade but she notices that the back pain increased with left leg pain onset approximately 6 months ago.  She now feels that the leg pain is much worse than the back pain.  She does note some weakness in the left leg but some is very pain limited.  She is currently on oxycodone and gabapentin without much relief.  She has been seen for multiple injections in her back to include 3 epidural and two foraminal injections without any significant relief.  She states that she is able to walk but going down steps worsens her pain.  Pain will travel down the outside of her thigh into the anterior portion of her leg but not into her foot.  She does not note any numbness.  History of diabetes with recent A1c of 6.2 she is currently on a baby aspirin   Past Medical History:      Past Medical History:  Diagnosis Date  . Allergic state   . Carpal tunnel syndrome   . Chickenpox   . Edema 01/28/2014  . Gastritis   . GERD (gastroesophageal reflux disease)   . History of thyroid cancer 2008   stage I multifocal papillary thyroid carcinoma  . HLD (hyperlipidemia), unspecified 11/16/2012  . Hyperglycemia, unspecified 11/13/2012  . Hypertension   . Meningitis as a child  . Migraines   . Osteoarthritis ([place]) 06/06/2014   a.  Lumbar spine. b.  Carpal tunnel.   . Prediabetes   . Sleep apnea   . Thyroid disease    postsurgical hypothyroidism    Social History: SocialHistory  Social History        Social History  . Marital status: Married    Spouse name: N/A  . Number of children: N/A  . Years of education: N/A      Occupational History  . Not on file.         Social History Main Topics  . Smoking status: Current Some Day  Smoker    Types: Cigarettes    Last attempt to quit: 08/15/1988  . Smokeless tobacco: Never Used     Comment: Still smokes when she uses alcohol "once in a blue moon"  . Alcohol use 0.0 oz/week     Comment: rare  . Drug use: No  . Sexual activity: Yes    Partners: Male    Birth control/ protection: Surgical     Comment: husband       Other Topics Concern  . Not on file      Social History Narrative   Married in 1987.  2 kids, 2 miscarriages.  No tob.  Rare etoh.  No drugs.  Works at Museum/gallery curator, [place].  Exercise: walkjng some.  Wears seatbelt, sunscreen.  Dentist: yes.  Calcium in diet: yes.  Religious beliefs affecting health care: none.        Advanced Directives: Living Will.             Living arrangements (living alone, with partner): Works at [employer]  Family History:      Family History  Problem Relation Age of Onset  . High blood pressre (Hypertension) Mother   . Diabetes type  II Mother   . Dementia Father   . Asthma Father   . Depression Daughter   . Lung cancer Maternal Grandmother   . High blood pressre (Hypertension) Brother   . No Known Problems Son   . Gallbladder disease Other     Review of Systems:  General ROS: Negative Psychological ROS: Negative Ophthalmic ROS: Negative ENT ROS: Negative Hematological and Lymphatic ROS: Negative  Endocrine ROS: Negative Respiratory ROS: Negative Cardiovascular ROS: Negative Gastrointestinal ROS: Negative Genito-Urinary ROS: Negative Musculoskeletal ROS: Positive for back pain Neurological ROS: Positive for leg pain Dermatological ROS: Negative  Physical Exam:   No data recorded.  Weight: 62.2 kg (137 lb 3.2 oz) General appearance: Alert, cooperative, in no acute distress Head: Normocephalic, atraumatic Eyes: Normal, EOM intact Oropharynx: Moist without lesions Ext: No edema in LE bilaterally, tone  Neurologic exam:  Mental status: alertness:  alert, affect: normal Speech: fluent and clear Motor: 5 out of 5 strength in right lower extremity, she is 5 out of 5 strength in the left lower extremity except for EHL which is 4+ out of 5 Sensory: intact to light touch in the lateral lower extremely Reflexes: 1+ at left patella, 2+ right patella Gait: normal   CV: No murmurs, Normal S1, S2 Pulm: Clear to auscultation  Data: Imaging: MRI lumbar spine: There is a overall good lordosis with multilevel degenerative changes noted with osteophyte and disc bulges.  Overall normal alignment.  There is stenosis noted centrally at L3-4 and L4-5 with left greater than right foraminal stenosis at L4-5.  There is significant lateral recess stenosis at these levels also.  Present on Admission Conditions: None  Complications & Comorbidities: HTN Hx of thyroid cancer  Assessment & Plan:  Sherri Dickerson has a left L5 radiculopathy with some pain also in the L4 distribution.  She does not have any significant numbness but does have some left EHL weakness.  I discussed with her that she has attempted extensive conservative measures to include over-the-counter medications, prescription medications, injections and is not experience much relief.  She would like to consider surgical intervention and I recommended a left L3-4 and L4-5 hemilaminectomy and foraminotomies for decompression of the nerve and lateral recess.  We discussed the risks include hematoma, infection, damage to nerve roots, weakness, risk of anesthesia, and need for possible fusion in the future.     1. Plan is for a left L3/4 and L4/5 hemilaminectomy and decompression  VTE Prophylaxis: SCDs  Code Status: Full Code  Discharge Planning: Home from [place], MD 07/11/2016

## 2016-07-25 NOTE — Discharge Instructions (Addendum)
AMBULATORY SURGERY  DISCHARGE INSTRUCTIONS   1) The drugs that you were given will stay in your system until tomorrow so for the next 24 hours you should not:  A) Drive an automobile B) Make any legal decisions C) Drink any alcoholic beverage   2) You may resume regular meals tomorrow.  Today it is better to start with liquids and gradually work up to solid foods.  You may eat anything you prefer, but it is better to start with liquids, then soup and crackers, and gradually work up to solid foods.   3) Please notify your doctor immediately if you have any unusual bleeding, trouble breathing, redness and pain at the surgery site, drainage, fever, or pain not relieved by medication.    4) Additional Instructions:        Please contact your physician with any problems or Same Day Surgery at [phone], Monday through Friday 6 am to 4 pm, or [place] [facility] -  number at [phone].      NEUROSURGERY DISCHARGE INSTRUCTIONS  Admission Diagnosis:  Lumbar radiculopathy  Discharge Diagnosis: Lumbar radiculopathy  Operative procedure: Left L3/4 and L4/5 Hemilaminectomy and Foraminotomies   The following are instructions to help in your recovery once you have been discharged from the hospital. Even if you feel well, it is important that you follow these activity guidelines. If you do not let your neck heal properly from the surgery, you can increase the chance of return of your symptoms and other complications.   What to do after you leave the hospital:  Recommended diet:  Increase protein intake to promote wound healing. You may return to your usual diet. However, you may experience discomfort when swallowing in the first month after your surgery. This is normal. You may find that softer foods are more comfortable for you to swallow. Be sure to stay hydrated.   Recommended activity: No bending, lifting, or twisting (BLT). Avoid lifting objects heavier than 10  pounds (gallon milk jug). Where possible, avoid household activities that involve lifting, bending, reaching, pushing, or pulling such as laundry, vacuuming, grocery shopping, and childcare. Try to arrange for help from friends and family for these activities while your back heals.   Increase physical activity slowly as tolerated. Taking short walks is encouraged, but avoid strenuous exercise. Do not jog, run, bicycle, lift weights, or participate in any other exercises unless specifically allowed by your doctor.   You should not drive until cleared by your doctor.   Until released by your doctor, you should not return to work or school. You should rest at home and let your body heal.   You may shower the day after your surgery. After showering, lightly dab your incision dry. Do not take a tub bath or go swimming until approved by your doctor at your follow-up appointment.   If you smoke, we strongly recommend that you quit. Smoking has been proven to interfere with normal bone healing and will dramatically reduce the success rate of your surgery. Please contact [facility] (800-QUIT-NOW) and use the resources at www.[facility].com for assistance in stopping smoking.   Medications  Do not restart Aspirin until seven days after surgery  You may restart home medications.   Special Instructions  If you have a dressing on your incision, you may remove it two days after your surgery. Keep your incision area clean and dry.   If you have staples or stitches on your incision, you should have a follow up scheduled for removal. If  you do not have staples or stitches, you will have steri-strips (small pieces of surgical tape) or Dermabond glue. The steri-strips/glue should begin to peel away within about a week (it is fine if the steri-strips fall off before then). If the strips are still in place one week after your surgery, you may gently remove them.     Please Report any of the following: You may  experience pain in your back. This is normal and should improve in the next few weeks with the help of pain medication, muscle relaxers, and rest. Some patients report that a warm compress on the back helps.   However, should you experience any of the following, contact us immediately:   New numbness or weakness   Pain that is progressively getting worse, and is not relieved by your pain medication, muscle relaxers, rest, and warm compresses   Bleeding, redness, swelling, pain, or drainage from surgical incision   Chills or flu-like symptoms   Fever greater than 101.0 F (38.3 C)   Inability to eat, drink fluids, or take medications   Problems with bowel or bladder functions   Difficulty breathing or shortness of breath   Warmth, tenderness, or swelling in your calf    Additional Follow up appointments During office hours (Monday-Friday 9 am to 5 pm), please call your physician at [phone]  After hours and weekends, please call the [facility] Operator at  [phone] and ask for the Neurosurgeon On Call   For a life-threatening emergency, call 911    Please see below for scheduled appointments:  Jan 9th, 2018 with Dr. Lacinda Axon in [facility]

## 2016-07-25 NOTE — Brief Op Note (Signed)
07/25/2016  3:31 PM  PATIENT:  Sherri Dickerson  55 y.o. female  PRE-OPERATIVE DIAGNOSIS:  LEFT LUMBAR RADICULPATHY  POST-OPERATIVE DIAGNOSIS:  LEFT LUMBAR RADICULPATHY,  L3-4, L4-5  PROCEDURE:  Procedure(s) with comments: LUMBAR LAMINECTOMY/DECOMPRESSION MICRODISCECTOMY 2 LEVELS (Left) - L3-4 hemilaminectomy and  L4-5 full laminectomy FORAMINOTOMY 2 LEVEL - L4-5, L3-4  SURGEON:  Surgeon(s) and Role:    * Deetta Perla, MD - Primary  ASSISTANTS: None  ANESTHESIA:   general  EBL:  Total I/O In: 1300 [I.V.:1300] Out: 150 [Blood:150]  BLOOD ADMINISTERED:none  DRAINS: none   LOCAL MEDICATIONS USED:  MARCAINE     SPECIMEN:  No Specimen  DISPOSITION OF SPECIMEN:  N/A  COUNTS:  YES  TOURNIQUET:  * No tourniquets in log *  DICTATION: .Note written in EPIC  PLAN OF CARE: Discharge to home after PACU  PATIENT DISPOSITION:  PACU - hemodynamically stable.

## 2016-07-25 NOTE — Anesthesia Procedure Notes (Signed)
Procedure Name: Intubation Date/Time: 07/25/2016 11:42 AM Performed by: Aline Brochure Pre-anesthesia Checklist: Patient identified, Emergency Drugs available, Suction available and Patient being monitored Patient Re-evaluated:Patient Re-evaluated prior to inductionOxygen Delivery Method: Circle system utilized Preoxygenation: Pre-oxygenation with 100% oxygen Intubation Type: IV induction Ventilation: Mask ventilation without difficulty Laryngoscope Size: Mac and 3 Grade View: Grade II Tube type: Oral Tube size: 7.0 mm Number of attempts: 2 Airway Equipment and Method: Stylet Placement Confirmation: ETT inserted through vocal cords under direct vision,  positive ETCO2 and breath sounds checked- equal and bilateral Secured at: 21 cm Tube secured with: Tape Dental Injury: Teeth and Oropharynx as per pre-operative assessment  Difficulty Due To: Difficult Airway- due to anterior larynx

## 2016-07-25 NOTE — Transfer of Care (Signed)
Immediate Anesthesia Transfer of Care Note  Patient: Sherri Dickerson  Procedure(s) Performed: Procedure(s) with comments: LUMBAR LAMINECTOMY/DECOMPRESSION MICRODISCECTOMY 2 LEVELS (Left) - L3-4 hemilaminectomy and  L4-5 full laminectomy FORAMINOTOMY 2 LEVEL - L4-5, L3-4  Patient Location: PACU  Anesthesia Type:General  Level of Consciousness: awake, alert  and oriented  Airway & Oxygen Therapy: Patient connected to face mask oxygen  Post-op Assessment: Post -op Vital signs reviewed and stable  Post vital signs: stable  Last Vitals:  Vitals:   07/25/16 0849  BP: 138/80  Pulse: 90  Resp: 18  Temp: 36.8 C    Last Pain:  Vitals:   07/25/16 0849  TempSrc: Oral  PainSc: 5       Patients Stated Pain Goal: 2 ([id number])  Complications: No apparent anesthesia complications

## 2016-07-25 NOTE — Anesthesia Preprocedure Evaluation (Addendum)
Anesthesia Evaluation  Patient identified by MRN, date of birth, ID band Patient awake    Reviewed: Allergy & Precautions, NPO status , Patient's Chart, lab work & pertinent test results, reviewed documented beta blocker date and time   Airway Mallampati: III  TM Distance: >3 FB     Dental  (+) Chipped   Pulmonary sleep apnea , Current Smoker,           Cardiovascular hypertension, Pt. on medications      Neuro/Psych  Headaches, Anxiety    GI/Hepatic   Endo/Other  diabetes, Type 2Hypothyroidism   Renal/GU      Musculoskeletal  (+) Arthritis ,   Abdominal   Peds  Hematology   Anesthesia Other Findings Obesity.EKG reviewed and ok.  Reproductive/Obstetrics                            Anesthesia Physical Anesthesia Plan  ASA: III  Anesthesia Plan: General   Post-op Pain Management:    Induction: Intravenous  Airway Management Planned: Oral ETT  Additional Equipment:   Intra-op Plan:   Post-operative Plan:   Informed Consent: I have reviewed the patients History and Physical, chart, labs and discussed the procedure including the risks, benefits and alternatives for the proposed anesthesia with the patient or authorized representative who has indicated his/her understanding and acceptance.     Plan Discussed with: CRNA  Anesthesia Plan Comments:         Anesthesia Quick Evaluation

## 2016-07-25 NOTE — Op Note (Signed)
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 She was also having numbness in the left leg with laying flat. She had failed conservative management including PT, steroid injections, and prescription medications. MRI revealed stenosis at L3/4 and L4/5.  Left sided decompression was discussed to relieve symptoms. Therisks of surgery were explained to include hematoma, infection, damage to nerve roots, CSF leak, weakness, numbness, pain, need for future surgery including fusion, heart attack, and stroke. She elected to proceed with surgery for symptom relief.   Procedure The patient was brought to the OR after informed consent was obtained. She was given general anesthesia and intubated by the anesthesia service. Vascular access lines were placed.The patient was then placed prone on a Wilson frameensuring all pressure points were padded. A time-out was performed per protocol.   The patient was sterilely prepped and draped. Fluoroscopy confirmed L3- L4 lamina and incision was planned.The incision was instilled withlocal anesthetic with epinephrine. The skin was opened sharply and the dissection taken to the fascia. This was incised and cautery was used to dissect the subperiosteal plane to expose the spinous processes and lamina of L3- L5 on the left. The decompression was performed first at L3/4. Retractors were inserted and hemostasis was achieved. X-ray was used to  confirm location.  Next, a matchstickdrill bit was used to remove the inferior L3 lamina and superior L4 lamina to expose the ligament. The underlying ligament was freed and removed with combination of rongeurs starting laterally to medially. Attention was given to the left lateral recess and the L3/4 foramina. The nerve roots were followed out the L3/4 foramina and was found to be decompressed. The decompression was carried across the midline and the ligament removed along the right lateral recess. Floseal was placed for hemostasis.   The retracors were moved caudally and the decompression at L4/5 performed. The drill was used to remove the inferior part of L4 lamina and superior of L5.  Next attention was turned to the lateral stenosis and the ligamentwas freed from the medial facets using rongeurs. This continued along the left side to the level of the L5 pedicle. The foraminotomy was performed at L4/5 level on the left. Next the decompression was taken medially and here CSF was encountered. The laminectomy was taken to the right side for visualization and the microscope brought into the field.  The right lateral recess was then decompressed in a similar fashion. A small durotomy was noted on the posterior side of the dura. A 5-0 prolene suture was used to close primarily. No CSF leak was seen afterward.   Once the dura appeared free from all the bone edges and all soft tissue compression was removed, hemostasis was obtained with Floseal and cautery. The wound was irrigated profusely. Duraseal was placed over the dural repair. Depomedrol was placed in the lateral recess. The muscle was then closed using 0 vicryl followed by the fascia with 0 vicryl. Local anestheticwas injected into the muscle. Next, multiple subcutaneous and dermal layers were closed with 2-0 vicryl and 3-0 vicryluntil the epidermis was well approximated. The  skin was closed with Dermabond. A dressing was applied.  The patient  was returned to supine position and extubated by the anesthesia service. The patient was then taken to the PACU for post-operative care where she was moving extremities symmetrically.   ESTIMATED BLOOD LOSS: 200 cc  SPECIMENS None  IMPLANT None   I performed the case in its entirety  Sherri Dickerson, [place]

## 2016-07-27 ENCOUNTER — Encounter: Payer: Self-pay | Admitting: Neurosurgery

## 2016-07-27 NOTE — Anesthesia Postprocedure Evaluation (Signed)
Anesthesia Post Note  Patient: Sherri Dickerson  Procedure(s) Performed: Procedure(s) (LRB): LUMBAR LAMINECTOMY/DECOMPRESSION MICRODISCECTOMY 2 LEVELS (Left) FORAMINOTOMY 2 LEVEL  Patient location during evaluation: PACU Anesthesia Type: General Level of consciousness: awake Pain management: pain level controlled Vital Signs Assessment: post-procedure vital signs reviewed and stable Respiratory status: spontaneous breathing Cardiovascular status: stable Anesthetic complications: no    Last Vitals:  Vitals:   07/25/16 1630 07/25/16 1719  BP: 137/77 136/89  Pulse: 92 98  Resp: 18 16  Temp: 36.5 C     Last Pain:  Vitals:   07/25/16 1719  TempSrc:   PainSc: 3                  VAN STAVEREN,Lorrayne Ismael

## 2016-10-25 ENCOUNTER — Ambulatory Visit
Admission: RE | Admit: 2016-10-25 | Discharge: 2016-10-25 | Disposition: A | Discharge status: Home / Self Care | Payer: BLUE CROSS/BLUE SHIELD | Source: Ambulatory Visit | Attending: Family Medicine | Admitting: Family Medicine

## 2016-10-25 DIAGNOSIS — Z1231 Encounter for screening mammogram for malignant neoplasm of breast: Secondary | ICD-10-CM | POA: Diagnosis present

## 2016-12-07 ENCOUNTER — Other Ambulatory Visit: Payer: Self-pay | Admitting: Neurosurgery

## 2016-12-07 DIAGNOSIS — M5416 Radiculopathy, lumbar region: Secondary | ICD-10-CM

## 2016-12-13 ENCOUNTER — Ambulatory Visit
Admission: RE | Admit: 2016-12-13 | Discharge: 2016-12-13 | Disposition: A | Discharge status: Home / Self Care | Payer: BLUE CROSS/BLUE SHIELD | Source: Ambulatory Visit | Attending: Neurosurgery | Admitting: Neurosurgery

## 2016-12-13 DIAGNOSIS — M5416 Radiculopathy, lumbar region: Secondary | ICD-10-CM

## 2016-12-13 DIAGNOSIS — M48061 Spinal stenosis, lumbar region without neurogenic claudication: Secondary | ICD-10-CM | POA: Insufficient documentation

## 2016-12-13 MED ORDER — GADOBENATE DIMEGLUMINE 529 MG/ML IV SOLN
20.0000 mL | Freq: Once | INTRAVENOUS | Status: AC | PRN
  Administered 2016-12-13: 20 mL via INTRAVENOUS

## 2017-03-31 ENCOUNTER — Inpatient Hospital Stay: Admission: RE | Admit: 2017-03-31 | Payer: BLUE CROSS/BLUE SHIELD | Source: Ambulatory Visit

## 2017-04-18 ENCOUNTER — Encounter
Admission: RE | Admit: 2017-04-18 | Discharge: 2017-04-18 | Disposition: A | Discharge status: Home / Self Care | Payer: BLUE CROSS/BLUE SHIELD | Source: Ambulatory Visit | Attending: Neurosurgery | Admitting: Neurosurgery

## 2017-04-18 ENCOUNTER — Ambulatory Visit
Admission: RE | Admit: 2017-04-18 | Discharge: 2017-04-18 | Disposition: A | Discharge status: Home / Self Care | Payer: BLUE CROSS/BLUE SHIELD | Source: Ambulatory Visit | Attending: Neurosurgery | Admitting: Neurosurgery

## 2017-04-18 DIAGNOSIS — M5416 Radiculopathy, lumbar region: Secondary | ICD-10-CM | POA: Diagnosis not present

## 2017-04-18 DIAGNOSIS — I10 Essential (primary) hypertension: Secondary | ICD-10-CM

## 2017-04-18 DIAGNOSIS — M48061 Spinal stenosis, lumbar region without neurogenic claudication: Secondary | ICD-10-CM | POA: Insufficient documentation

## 2017-04-18 DIAGNOSIS — Z01810 Encounter for preprocedural cardiovascular examination: Secondary | ICD-10-CM | POA: Insufficient documentation

## 2017-04-18 DIAGNOSIS — Z01818 Encounter for other preprocedural examination: Secondary | ICD-10-CM | POA: Insufficient documentation

## 2017-04-18 DIAGNOSIS — Z01812 Encounter for preprocedural laboratory examination: Secondary | ICD-10-CM | POA: Insufficient documentation

## 2017-04-18 LAB — URINALYSIS, ROUTINE W REFLEX MICROSCOPIC
Bacteria, UA: NONE SEEN
Glucose, UA: NEGATIVE mg/dL
Hgb urine dipstick: NEGATIVE
Ketones, ur: 5 mg/dL — AB
Leukocytes, UA: NEGATIVE
Nitrite: NEGATIVE
Protein, ur: 100 mg/dL — AB
Specific Gravity, Urine: 1.033 — ABNORMAL HIGH (ref 1.005–1.030)
pH: 5 (ref 5.0–8.0)

## 2017-04-18 LAB — CBC
HCT: 38 % (ref 35.0–47.0)
Hemoglobin: 13.1 g/dL (ref 12.0–16.0)
MCH: 32.2 pg (ref 26.0–34.0)
MCHC: 34.5 g/dL (ref 32.0–36.0)
MCV: 93.2 fL (ref 80.0–100.0)
Platelets: 269 10*3/uL (ref 150–440)
RBC: 4.07 MIL/uL (ref 3.80–5.20)
RDW: 14.2 % (ref 11.5–14.5)
WBC: 10 10*3/uL (ref 3.6–11.0)

## 2017-04-18 LAB — DIFFERENTIAL
Basophils Absolute: 0 10*3/uL (ref 0–0.1)
Basophils Relative: 0 %
Eosinophils Absolute: 0.2 10*3/uL (ref 0–0.7)
Eosinophils Relative: 2 %
Lymphocytes Relative: 32 %
Lymphs Abs: 3.2 10*3/uL (ref 1.0–3.6)
Monocytes Absolute: 0.6 10*3/uL (ref 0.2–0.9)
Monocytes Relative: 6 %
Neutro Abs: 6.1 10*3/uL (ref 1.4–6.5)
Neutrophils Relative %: 60 %

## 2017-04-18 LAB — PROTIME-INR
INR: 0.96
Prothrombin Time: 12.7 seconds (ref 11.4–15.2)

## 2017-04-18 LAB — BASIC METABOLIC PANEL
Anion gap: 11 (ref 5–15)
BUN: 20 mg/dL (ref 6–20)
CO2: 26 mmol/L (ref 22–32)
Calcium: 9.5 mg/dL (ref 8.9–10.3)
Chloride: 101 mmol/L (ref 101–111)
Creatinine, Ser: 0.78 mg/dL (ref 0.44–1.00)
GFR calc Af Amer: >60 mL/min (ref >60)
GFR calc non Af Amer: >60 mL/min (ref >60)
Glucose, Bld: 110 mg/dL — ABNORMAL HIGH (ref 65–99)
Potassium: 3.6 mmol/L (ref 3.5–5.1)
Sodium: 138 mmol/L (ref 135–145)

## 2017-04-18 LAB — APTT: aPTT: 26 seconds (ref 24–36)

## 2017-04-18 LAB — SURGICAL PCR SCREEN
MRSA, PCR: NEGATIVE
Staphylococcus aureus: NEGATIVE

## 2017-04-18 NOTE — Patient Instructions (Signed)
  Your procedure is scheduled UV:[id number] Sept. 10th , 2018. Report to Same Day Surgery. To find out your arrival time please call [phone] between 1PM - 3PM on Friday Sept. 7, 2018.  Remember: Instructions that are not followed completely may result in serious medical risk, up to and including death, or upon the discretion of your surgeon and anesthesiologist your surgery may need to be rescheduled.    _x___ 1. Do not eat food or drink liquids after midnight. No gum chewing or hard candies. You can have water   up to 2 hour prior to arrival time.    _x___ 2. No Alcohol for 24 hours before or after surgery.   ____ 3. Bring all medications with you on the day of surgery if instructed.    __x__ 4. Notify your doctor if there is any change in your medical condition     (cold, fever, infections).    _x____ 5. No smoking 24 hours prior to surgery.     Do not wear jewelry, make-up, hairpins, clips or nail polish.  Do not wear lotions, powders, or perfumes.   Do not shave 48 hours prior to surgery. Men may shave face and neck.  Do not bring valuables to the hospital.    [facility] is not responsible for any belongings or valuables.               Contacts, dentures or bridgework may not be worn into surgery.  Leave your suitcase in the car. After surgery it may be brought to your room.  For patients admitted to the hospital, discharge time is determined by your treatment team.   Patients discharged the day of surgery will not be allowed to drive home.    Please read over the following fact sheets that you were given:   [facility] Preparing for Surgery  __x__ Take these medicines the morning of surgery with A SIP OF WATER:    1. gabapentin (NEURONTIN)   2. RABEprazole (ACIPHEX)   3. SYNTHROID   ____ Fleet Enema (as directed)   _x___ Use CHG Soap as directed on instruction sheet  ____ Use inhalers on the day of surgery and bring to hospital day of surgery   _x___ Stop  metformin 2 days prior to surgery, last dose on Friday.   ____ Take 1/2 of usual insulin dose the night before surgery and none on the morning of surgery.   __x__ Stop aspirin 7 days prior to surgery.  __x__ Stop Anti-inflammatories such as Advil, Aleve, Ibuprofen, Motrin, Naproxen, Naprosyn, Goodies powders or aspirin  products. OK to take Tylenol.   ____ Stop supplements until after surgery.    __x__ Bring C-Pap to the hospital.

## 2017-04-20 NOTE — Pre-Procedure Instructions (Signed)
CALL FROM [facility] WITH NEURO, NO TX FOR UA NEEDED

## 2017-04-24 ENCOUNTER — Inpatient Hospital Stay: Payer: BLUE CROSS/BLUE SHIELD

## 2017-04-24 ENCOUNTER — Encounter: Payer: Self-pay | Admitting: *Deleted

## 2017-04-24 ENCOUNTER — Inpatient Hospital Stay: Payer: BLUE CROSS/BLUE SHIELD | Admitting: Anesthesiology

## 2017-04-24 ENCOUNTER — Encounter
Admission: RE | Disposition: A | Discharge status: Home / Self Care | Payer: Self-pay | Source: Ambulatory Visit | Attending: Neurosurgery

## 2017-04-24 ENCOUNTER — Inpatient Hospital Stay
Admission: RE | Admit: 2017-04-24 | Discharge: 2017-04-27 | DRG: 455 | Disposition: A | Discharge status: Home / Self Care | Payer: BLUE CROSS/BLUE SHIELD | Source: Ambulatory Visit | Attending: Neurosurgery | Admitting: Neurosurgery

## 2017-04-24 DIAGNOSIS — E669 Obesity, unspecified: Secondary | ICD-10-CM | POA: Diagnosis present

## 2017-04-24 DIAGNOSIS — F17210 Nicotine dependence, cigarettes, uncomplicated: Secondary | ICD-10-CM | POA: Diagnosis present

## 2017-04-24 DIAGNOSIS — Z419 Encounter for procedure for purposes other than remedying health state, unspecified: Secondary | ICD-10-CM

## 2017-04-24 DIAGNOSIS — M961 Postlaminectomy syndrome, not elsewhere classified: Secondary | ICD-10-CM | POA: Diagnosis present

## 2017-04-24 DIAGNOSIS — M4800 Spinal stenosis, site unspecified: Secondary | ICD-10-CM | POA: Diagnosis present

## 2017-04-24 DIAGNOSIS — E785 Hyperlipidemia, unspecified: Secondary | ICD-10-CM | POA: Diagnosis present

## 2017-04-24 DIAGNOSIS — R51 Headache: Secondary | ICD-10-CM | POA: Diagnosis present

## 2017-04-24 DIAGNOSIS — Z923 Personal history of irradiation: Secondary | ICD-10-CM | POA: Diagnosis not present

## 2017-04-24 DIAGNOSIS — M5116 Intervertebral disc disorders with radiculopathy, lumbar region: Secondary | ICD-10-CM | POA: Diagnosis present

## 2017-04-24 DIAGNOSIS — Z981 Arthrodesis status: Secondary | ICD-10-CM

## 2017-04-24 DIAGNOSIS — I10 Essential (primary) hypertension: Secondary | ICD-10-CM | POA: Diagnosis present

## 2017-04-24 DIAGNOSIS — R58 Hemorrhage, not elsewhere classified: Secondary | ICD-10-CM | POA: Diagnosis not present

## 2017-04-24 DIAGNOSIS — E119 Type 2 diabetes mellitus without complications: Secondary | ICD-10-CM | POA: Diagnosis present

## 2017-04-24 DIAGNOSIS — E890 Postprocedural hypothyroidism: Secondary | ICD-10-CM | POA: Diagnosis present

## 2017-04-24 DIAGNOSIS — G4730 Sleep apnea, unspecified: Secondary | ICD-10-CM | POA: Diagnosis present

## 2017-04-24 DIAGNOSIS — F419 Anxiety disorder, unspecified: Secondary | ICD-10-CM | POA: Diagnosis present

## 2017-04-24 DIAGNOSIS — Z6837 Body mass index (BMI) 37.0-37.9, adult: Secondary | ICD-10-CM

## 2017-04-24 DIAGNOSIS — Z90710 Acquired absence of both cervix and uterus: Secondary | ICD-10-CM | POA: Diagnosis not present

## 2017-04-24 DIAGNOSIS — M48061 Spinal stenosis, lumbar region without neurogenic claudication: Principal | ICD-10-CM | POA: Diagnosis present

## 2017-04-24 HISTORY — PX: TRANSFORAMINAL LUMBAR INTERBODY FUSION (TLIF) WITH PEDICLE SCREW FIXATION 3 LEVEL: SHX6143

## 2017-04-24 LAB — GLUCOSE, CAPILLARY
Glucose-Capillary: 94 mg/dL (ref 65–99)
Glucose-Capillary: 142 mg/dL — ABNORMAL HIGH (ref 65–99)
Glucose-Capillary: 163 mg/dL — ABNORMAL HIGH (ref 65–99)

## 2017-04-24 LAB — HEMOGLOBIN AND HEMATOCRIT, BLOOD
HCT: 28 % — ABNORMAL LOW (ref 35.0–47.0)
Hemoglobin: 9.4 g/dL — ABNORMAL LOW (ref 12.0–16.0)

## 2017-04-24 LAB — PREPARE RBC (CROSSMATCH)

## 2017-04-24 LAB — ABO/RH: ABO/RH(D): B POS

## 2017-04-24 SURGERY — TRANSFORAMINAL LUMBAR INTERBODY FUSION (TLIF) WITH PEDICLE SCREW FIXATION 3 LEVEL
Anesthesia: General | Site: Spine Lumbar | Laterality: Bilateral | Wound class: Clean

## 2017-04-24 MED ORDER — FENTANYL CITRATE (PF) 100 MCG/2ML IJ SOLN
INTRAMUSCULAR | Status: AC
  Filled 2017-04-24: qty 2

## 2017-04-24 MED ORDER — BUPIVACAINE-EPINEPHRINE (PF) 0.5% -1:200000 IJ SOLN
INTRAMUSCULAR | Status: AC
  Filled 2017-04-24: qty 30

## 2017-04-24 MED ORDER — PROPOFOL 500 MG/50ML IV EMUL
INTRAVENOUS | Status: AC
  Filled 2017-04-24: qty 50

## 2017-04-24 MED ORDER — NALOXONE HCL 0.4 MG/ML IJ SOLN: 0.4000 mg | INTRAMUSCULAR | Status: DC | PRN

## 2017-04-24 MED ORDER — SODIUM CHLORIDE 0.9 % IV SOLN: 250.0000 mL | INTRAVENOUS | Status: DC

## 2017-04-24 MED ORDER — BACITRACIN 50000 UNITS IM SOLR
INTRAMUSCULAR | Status: AC
  Filled 2017-04-24: qty 1

## 2017-04-24 MED ORDER — ACETAMINOPHEN 325 MG PO TABS: 650.0000 mg | ORAL_TABLET | ORAL | Status: DC | PRN

## 2017-04-24 MED ORDER — METHOCARBAMOL 500 MG PO TABS
1000.0000 mg | ORAL_TABLET | Freq: Four times a day (QID) | ORAL | Status: DC
  Administered 2017-04-24: 1000 mg via ORAL
  Filled 2017-04-24: qty 2

## 2017-04-24 MED ORDER — ALBUMIN HUMAN 5 % IV SOLN
INTRAVENOUS | Status: AC
Start: 2017-04-24 — End: 2017-04-24
  Filled 2017-04-24: qty 500

## 2017-04-24 MED ORDER — THROMBIN 5000 UNITS EX SOLR
CUTANEOUS | Status: AC
  Filled 2017-04-24: qty 5000

## 2017-04-24 MED ORDER — ROCURONIUM BROMIDE 100 MG/10ML IV SOLN
INTRAVENOUS | Status: DC | PRN
  Administered 2017-04-24: 20 mg via INTRAVENOUS

## 2017-04-24 MED ORDER — ONDANSETRON HCL 4 MG/2ML IJ SOLN
INTRAMUSCULAR | Status: AC
  Filled 2017-04-24: qty 2

## 2017-04-24 MED ORDER — ATORVASTATIN CALCIUM 20 MG PO TABS
40.0000 mg | ORAL_TABLET | Freq: Every day | ORAL | Status: DC
  Administered 2017-04-25 – 2017-04-26 (×2): 40 mg via ORAL
  Filled 2017-04-24: qty 2
  Filled 2017-04-24: qty 1
  Filled 2017-04-24: qty 2

## 2017-04-24 MED ORDER — THROMBIN 5000 UNITS EX SOLR
CUTANEOUS | Status: AC
Start: 2017-04-24 — End: 2017-04-24
  Filled 2017-04-24: qty 5000

## 2017-04-24 MED ORDER — ACETAMINOPHEN 500 MG PO TABS
1000.0000 mg | ORAL_TABLET | Freq: Four times a day (QID) | ORAL | Status: AC
  Administered 2017-04-25 (×2): 1000 mg via ORAL
  Filled 2017-04-24 (×3): qty 2

## 2017-04-24 MED ORDER — SODIUM CHLORIDE 0.9 % IV SOLN
INTRAVENOUS | Status: DC | PRN
  Administered 2017-04-24: .1 ug/kg/min via INTRAVENOUS

## 2017-04-24 MED ORDER — HYDROMORPHONE HCL 1 MG/ML IJ SOLN
0.5000 mg | INTRAMUSCULAR | Status: DC | PRN
  Administered 2017-04-24: 0.5 mg via INTRAVENOUS
  Filled 2017-04-24: qty 1

## 2017-04-24 MED ORDER — KETAMINE HCL 50 MG/ML IJ SOLN
INTRAMUSCULAR | Status: DC | PRN
  Administered 2017-04-24: 25 mg via INTRAMUSCULAR

## 2017-04-24 MED ORDER — ONDANSETRON HCL 4 MG/2ML IJ SOLN: 4.0000 mg | Freq: Four times a day (QID) | INTRAMUSCULAR | Status: DC | PRN

## 2017-04-24 MED ORDER — REMIFENTANIL HCL 1 MG IV SOLR
INTRAVENOUS | Status: AC
  Filled 2017-04-24: qty 1000

## 2017-04-24 MED ORDER — METHYLPREDNISOLONE ACETATE 40 MG/ML IJ SUSP
INTRAMUSCULAR | Status: AC
  Filled 2017-04-24: qty 1

## 2017-04-24 MED ORDER — LEVOTHYROXINE SODIUM 125 MCG PO TABS
125.0000 ug | ORAL_TABLET | Freq: Every day | ORAL | Status: DC
  Administered 2017-04-25 – 2017-04-27 (×3): 125 ug via ORAL
  Filled 2017-04-24 (×3): qty 1

## 2017-04-24 MED ORDER — BUPIVACAINE HCL (PF) 0.5 % IJ SOLN
INTRAMUSCULAR | Status: DC | PRN
  Administered 2017-04-24: 30 mL

## 2017-04-24 MED ORDER — ACETAMINOPHEN 10 MG/ML IV SOLN
INTRAVENOUS | Status: AC
  Filled 2017-04-24: qty 100

## 2017-04-24 MED ORDER — ONDANSETRON HCL 4 MG/2ML IJ SOLN
INTRAMUSCULAR | Status: DC | PRN
  Administered 2017-04-24: 4 mg via INTRAVENOUS

## 2017-04-24 MED ORDER — MIDAZOLAM HCL 2 MG/2ML IJ SOLN
INTRAMUSCULAR | Status: AC
  Filled 2017-04-24: qty 2

## 2017-04-24 MED ORDER — GABAPENTIN 300 MG PO CAPS
300.0000 mg | ORAL_CAPSULE | Freq: Three times a day (TID) | ORAL | Status: DC
  Administered 2017-04-24 – 2017-04-27 (×8): 300 mg via ORAL
  Filled 2017-04-24 (×8): qty 1

## 2017-04-24 MED ORDER — SODIUM CHLORIDE 0.9 % IR SOLN
Status: DC | PRN
  Administered 2017-04-24: 1000 mL

## 2017-04-24 MED ORDER — LACTATED RINGERS IV BOLUS (SEPSIS)
500.0000 mL | Freq: Once | INTRAVENOUS | Status: AC
  Administered 2017-04-24: 500 mL via INTRAVENOUS

## 2017-04-24 MED ORDER — CEFAZOLIN SODIUM-DEXTROSE 2-4 GM/100ML-% IV SOLN
INTRAVENOUS | Status: AC
  Filled 2017-04-24: qty 100

## 2017-04-24 MED ORDER — METFORMIN HCL 500 MG PO TABS
500.0000 mg | ORAL_TABLET | Freq: Every day | ORAL | Status: DC
  Administered 2017-04-25 – 2017-04-26 (×2): 500 mg via ORAL
  Filled 2017-04-24 (×4): qty 1

## 2017-04-24 MED ORDER — ONDANSETRON HCL 4 MG/2ML IJ SOLN
INTRAMUSCULAR | Status: AC
Start: 2017-04-24 — End: 2017-04-24
  Filled 2017-04-24: qty 2

## 2017-04-24 MED ORDER — VANCOMYCIN HCL 1000 MG IV SOLR
INTRAVENOUS | Status: AC
  Filled 2017-04-24: qty 1000

## 2017-04-24 MED ORDER — PROPOFOL 500 MG/50ML IV EMUL
INTRAVENOUS | Status: DC | PRN
  Administered 2017-04-24: 50 ug/kg/min via INTRAVENOUS

## 2017-04-24 MED ORDER — PHENYLEPHRINE HCL 10 MG/ML IJ SOLN
INTRAMUSCULAR | Status: DC | PRN
  Administered 2017-04-24: 100 ug via INTRAVENOUS
  Administered 2017-04-24: 200 ug via INTRAVENOUS
  Administered 2017-04-24 (×5): 100 ug via INTRAVENOUS

## 2017-04-24 MED ORDER — ACETAMINOPHEN 650 MG RE SUPP
650.0000 mg | RECTAL | Status: DC | PRN
  Filled 2017-04-24: qty 1

## 2017-04-24 MED ORDER — HYDROCHLOROTHIAZIDE 12.5 MG PO CAPS
12.5000 mg | ORAL_CAPSULE | Freq: Every day | ORAL | Status: DC
Start: 2017-04-24 — End: 2017-04-27
  Filled 2017-04-24: qty 1

## 2017-04-24 MED ORDER — INSULIN ASPART 100 UNIT/ML ~~LOC~~ SOLN
0.0000 [IU] | Freq: Three times a day (TID) | SUBCUTANEOUS | Status: DC
  Administered 2017-04-25 – 2017-04-26 (×3): 3 [IU] via SUBCUTANEOUS
  Filled 2017-04-24 (×4): qty 1

## 2017-04-24 MED ORDER — VANCOMYCIN HCL 10 G IV SOLR
1250.0000 mg | INTRAVENOUS | Status: AC
  Administered 2017-04-24 (×2): 1250 mg via INTRAVENOUS
  Filled 2017-04-24: qty 1250

## 2017-04-24 MED ORDER — SENNOSIDES-DOCUSATE SODIUM 8.6-50 MG PO TABS: 1.0000 | ORAL_TABLET | Freq: Every evening | ORAL | Status: DC | PRN

## 2017-04-24 MED ORDER — CEFAZOLIN SODIUM 1 G IJ SOLR
INTRAMUSCULAR | Status: AC
  Filled 2017-04-24: qty 20

## 2017-04-24 MED ORDER — LACTATED RINGERS IV SOLN
INTRAVENOUS | Status: DC | PRN
  Administered 2017-04-24 (×2): via INTRAVENOUS

## 2017-04-24 MED ORDER — PROPOFOL 10 MG/ML IV BOLUS
INTRAVENOUS | Status: DC | PRN
  Administered 2017-04-24: 100 mg via INTRAVENOUS

## 2017-04-24 MED ORDER — PHENYLEPHRINE HCL 10 MG/ML IJ SOLN
INTRAMUSCULAR | Status: DC | PRN
  Administered 2017-04-24: 20 ug/min via INTRAVENOUS

## 2017-04-24 MED ORDER — THROMBIN 5000 UNITS EX SOLR
CUTANEOUS | Status: DC | PRN
  Administered 2017-04-24: 5000 [IU] via TOPICAL

## 2017-04-24 MED ORDER — FENTANYL CITRATE (PF) 100 MCG/2ML IJ SOLN
25.0000 ug | INTRAMUSCULAR | Status: DC | PRN
  Administered 2017-04-24 (×5): 25 ug via INTRAVENOUS

## 2017-04-24 MED ORDER — DIPHENHYDRAMINE HCL 12.5 MG/5ML PO ELIX: 12.5000 mg | ORAL_SOLUTION | Freq: Four times a day (QID) | ORAL | Status: DC | PRN

## 2017-04-24 MED ORDER — SODIUM CHLORIDE 0.9% FLUSH
3.0000 mL | Freq: Two times a day (BID) | INTRAVENOUS | Status: DC
  Administered 2017-04-25 – 2017-04-26 (×2): 3 mL via INTRAVENOUS

## 2017-04-24 MED ORDER — SUCCINYLCHOLINE CHLORIDE 20 MG/ML IJ SOLN
INTRAMUSCULAR | Status: DC | PRN
  Administered 2017-04-24: 100 mg via INTRAVENOUS

## 2017-04-24 MED ORDER — ONDANSETRON HCL 4 MG PO TABS: 4.0000 mg | ORAL_TABLET | Freq: Four times a day (QID) | ORAL | Status: DC | PRN

## 2017-04-24 MED ORDER — DEXAMETHASONE SODIUM PHOSPHATE 10 MG/ML IJ SOLN
INTRAMUSCULAR | Status: DC | PRN
  Administered 2017-04-24: 10 mg via INTRAVENOUS

## 2017-04-24 MED ORDER — SODIUM CHLORIDE 0.9 % IV SOLN
INTRAVENOUS | Status: DC
  Administered 2017-04-25: 18:00:00 via INTRAVENOUS

## 2017-04-24 MED ORDER — KETAMINE HCL 50 MG/ML IJ SOLN
INTRAMUSCULAR | Status: AC
  Filled 2017-04-24: qty 10

## 2017-04-24 MED ORDER — ACETAMINOPHEN 10 MG/ML IV SOLN
INTRAVENOUS | Status: DC | PRN
  Administered 2017-04-24: 1000 mg via INTRAVENOUS

## 2017-04-24 MED ORDER — PANTOPRAZOLE SODIUM 40 MG PO TBEC
40.0000 mg | DELAYED_RELEASE_TABLET | Freq: Every day | ORAL | Status: DC
Start: 2017-04-25 — End: 2017-04-27
  Administered 2017-04-25 – 2017-04-27 (×3): 40 mg via ORAL
  Filled 2017-04-24 (×3): qty 1

## 2017-04-24 MED ORDER — CITALOPRAM HYDROBROMIDE 10 MG PO TABS
10.0000 mg | ORAL_TABLET | Freq: Every day | ORAL | Status: DC
  Administered 2017-04-25 – 2017-04-27 (×3): 10 mg via ORAL
  Filled 2017-04-24 (×4): qty 1

## 2017-04-24 MED ORDER — ONDANSETRON HCL 4 MG/2ML IJ SOLN
4.0000 mg | Freq: Once | INTRAMUSCULAR | Status: AC | PRN
  Administered 2017-04-24: 4 mg via INTRAVENOUS

## 2017-04-24 MED ORDER — PROPOFOL 500 MG/50ML IV EMUL
INTRAVENOUS | Status: AC
Start: 2017-04-24 — End: 2017-04-24
  Filled 2017-04-24: qty 50

## 2017-04-24 MED ORDER — GELATIN ABSORBABLE 12-7 MM EX MISC
CUTANEOUS | Status: AC
  Filled 2017-04-24: qty 1

## 2017-04-24 MED ORDER — FENTANYL 40 MCG/ML IV SOLN
INTRAVENOUS | Status: DC
  Administered 2017-04-24: 23:00:00 via INTRAVENOUS
  Filled 2017-04-24: qty 25

## 2017-04-24 MED ORDER — SODIUM CHLORIDE 0.9% FLUSH: 9.0000 mL | INTRAVENOUS | Status: DC | PRN

## 2017-04-24 MED ORDER — LISINOPRIL-HYDROCHLOROTHIAZIDE 10-12.5 MG PO TABS: 1.0000 | ORAL_TABLET | Freq: Every day | ORAL | Status: DC

## 2017-04-24 MED ORDER — SODIUM CHLORIDE 0.9 % IV SOLN
INTRAVENOUS | Status: DC
  Administered 2017-04-24 (×2): via INTRAVENOUS

## 2017-04-24 MED ORDER — MIDAZOLAM HCL 2 MG/2ML IJ SOLN
INTRAMUSCULAR | Status: DC | PRN
  Administered 2017-04-24: 2 mg via INTRAVENOUS

## 2017-04-24 MED ORDER — TOPIRAMATE 100 MG PO TABS
100.0000 mg | ORAL_TABLET | Freq: Every day | ORAL | Status: DC
  Administered 2017-04-25 – 2017-04-27 (×3): 100 mg via ORAL
  Filled 2017-04-24 (×4): qty 1

## 2017-04-24 MED ORDER — OXYCODONE HCL 5 MG PO TABS: 5.0000 mg | ORAL_TABLET | ORAL | Status: DC | PRN

## 2017-04-24 MED ORDER — CEFAZOLIN SODIUM-DEXTROSE 2-4 GM/100ML-% IV SOLN
2.0000 g | Freq: Three times a day (TID) | INTRAVENOUS | Status: DC
  Administered 2017-04-25 – 2017-04-27 (×8): 2 g via INTRAVENOUS
  Filled 2017-04-24 (×11): qty 100

## 2017-04-24 MED ORDER — FENTANYL CITRATE (PF) 100 MCG/2ML IJ SOLN
INTRAMUSCULAR | Status: DC | PRN
  Administered 2017-04-24: 50 ug via INTRAVENOUS
  Administered 2017-04-24: 100 ug via INTRAVENOUS
  Administered 2017-04-24 (×2): 25 ug via INTRAVENOUS

## 2017-04-24 MED ORDER — METHOCARBAMOL 1000 MG/10ML IJ SOLN
500.0000 mg | Freq: Four times a day (QID) | INTRAVENOUS | Status: DC
  Filled 2017-04-24 (×4): qty 5

## 2017-04-24 MED ORDER — EPHEDRINE SULFATE 50 MG/ML IJ SOLN
INTRAMUSCULAR | Status: DC | PRN
  Administered 2017-04-24: 5 mg via INTRAVENOUS
  Administered 2017-04-24: 10 mg via INTRAVENOUS
  Administered 2017-04-24 (×2): 5 mg via INTRAVENOUS

## 2017-04-24 MED ORDER — DIPHENHYDRAMINE HCL 50 MG/ML IJ SOLN: 12.5000 mg | Freq: Four times a day (QID) | INTRAMUSCULAR | Status: DC | PRN

## 2017-04-24 MED ORDER — SODIUM CHLORIDE FLUSH 0.9 % IV SOLN
INTRAVENOUS | Status: AC
  Filled 2017-04-24: qty 10

## 2017-04-24 MED ORDER — CEFAZOLIN SODIUM-DEXTROSE 2-4 GM/100ML-% IV SOLN
2.0000 g | INTRAVENOUS | Status: AC
  Administered 2017-04-24 (×2): 2 g via INTRAVENOUS

## 2017-04-24 MED ORDER — ALBUMIN HUMAN 5 % IV SOLN
INTRAVENOUS | Status: DC | PRN
  Administered 2017-04-24 (×2): via INTRAVENOUS

## 2017-04-24 MED ORDER — SODIUM CHLORIDE 0.9% FLUSH: 3.0000 mL | INTRAVENOUS | Status: DC | PRN

## 2017-04-24 MED ORDER — LISINOPRIL 10 MG PO TABS
10.0000 mg | ORAL_TABLET | Freq: Every day | ORAL | Status: DC
  Filled 2017-04-24 (×2): qty 1

## 2017-04-24 MED ORDER — LIDOCAINE HCL (CARDIAC) 20 MG/ML IV SOLN
INTRAVENOUS | Status: DC | PRN
  Administered 2017-04-24: 80 mg via INTRAVENOUS

## 2017-04-24 SURGICAL SUPPLY — 88 items
BLADE BOVIE TIP EXT 4 (BLADE) ×3 IMPLANT
BONE CANC CHIPS 20CC PCAN1/4 (Bone Implant) ×3 IMPLANT
BONE MATRIX OSTEOCEL PRO MED (Bone Implant) ×6 IMPLANT
BUR NEURO DRILL SOFT 3.0X3.8M (BURR) ×3 IMPLANT
CANISTER SUCT 1200ML W/VALVE (MISCELLANEOUS) ×6 IMPLANT
CHIPS CANC BONE 20CC PCAN1/4 (Bone Implant) ×1 IMPLANT
CHLORAPREP W/TINT 26ML (MISCELLANEOUS) ×3 IMPLANT
CNTNR SPEC 2.5X3XGRAD LEK (MISCELLANEOUS) ×1
CONT SPEC 4OZ STER OR WHT (MISCELLANEOUS) ×2
CONTAINER SPEC 2.5X3XGRAD LEK (MISCELLANEOUS) ×1 IMPLANT
COUNTER NEEDLE 20/40 LG (NEEDLE) ×3 IMPLANT
COVER LIGHT HANDLE STERIS (MISCELLANEOUS) ×6 IMPLANT
CUP MEDICINE 2OZ PLAST GRAD ST (MISCELLANEOUS) ×3 IMPLANT
DERMABOND ADVANCED (GAUZE/BANDAGES/DRESSINGS) ×2
DERMABOND ADVANCED .7 DNX12 (GAUZE/BANDAGES/DRESSINGS) ×1 IMPLANT
DIGITIZER BENDINI (MISCELLANEOUS) ×3 IMPLANT
DRAPE C-ARM 42X70 (DRAPES) ×6 IMPLANT
DRAPE LAPAROTOMY 100X77 ABD (DRAPES) ×3 IMPLANT
DRAPE MICROSCOPE SPINE 48X150 (DRAPES) ×3 IMPLANT
DRAPE POUCH INSTRU U-SHP 10X18 (DRAPES) ×3 IMPLANT
DRAPE SURG 17X11 SM STRL (DRAPES) ×12 IMPLANT
DRAPE TABLE BACK 80X90 (DRAPES) ×3 IMPLANT
DURASEAL APPLICATOR TIP (TIP) ×3 IMPLANT
DURASEAL SPINE SEALANT 3ML (MISCELLANEOUS) ×3 IMPLANT
ELECT CAUTERY BLADE TIP 2.5 (TIP) ×3
ELECT EZSTD 165MM 6.5IN (MISCELLANEOUS) ×3
ELECT REM PT RETURN 9FT ADLT (ELECTROSURGICAL) ×3
ELECTRODE CAUTERY BLDE TIP 2.5 (TIP) ×1 IMPLANT
ELECTRODE EZSTD 165MM 6.5IN (MISCELLANEOUS) ×1 IMPLANT
ELECTRODE REM PT RTRN 9FT ADLT (ELECTROSURGICAL) ×1 IMPLANT
FEE INTRAOP MONITOR IMPULS NCS (MISCELLANEOUS) ×1 IMPLANT
GAUZE SPONGE 4X4 12PLY STRL (GAUZE/BANDAGES/DRESSINGS) ×3 IMPLANT
GLOVE BIOGEL PI IND STRL 8 (GLOVE) ×1 IMPLANT
GLOVE BIOGEL PI INDICATOR 8 (GLOVE) ×2
GLOVE SURG SYN 8.0 (GLOVE) ×6 IMPLANT
GOWN STRL REUS W/ TWL LRG LVL3 (GOWN DISPOSABLE) ×2 IMPLANT
GOWN STRL REUS W/ TWL XL LVL3 (GOWN DISPOSABLE) ×1 IMPLANT
GOWN STRL REUS W/TWL LRG LVL3 (GOWN DISPOSABLE) ×4
GOWN STRL REUS W/TWL XL LVL3 (GOWN DISPOSABLE) ×2
GRADUATE 1200CC STRL 31836 (MISCELLANEOUS) ×6 IMPLANT
HANDLE YANKAUER SUCT BULB TIP (MISCELLANEOUS) ×3 IMPLANT
HEMOVAC LRG 3/16 (MISCELLANEOUS) ×3 IMPLANT
HUMERAL HEAD STERILE 48MMX24MM (Orthopedic Implant) ×3 IMPLANT
IMPL TLX 7X11X31 15DEG (Cage) ×1 IMPLANT
IMPL TLX 8X11X31 15DEG (Cage) ×1 IMPLANT
INTRAOP MONITOR FEE IMPULS NCS (MISCELLANEOUS) ×1
INTRAOP MONITOR FEE IMPULSE (MISCELLANEOUS) ×2
IV CATH ANGIO 12GX3 LT BLUE (NEEDLE) ×3 IMPLANT
KIT RM TURNOVER STRD PROC AR (KITS) ×3 IMPLANT
KIT SPINAL PRONEVIEW (KITS) ×3 IMPLANT
MARKER SKIN DUAL TIP RULER LAB (MISCELLANEOUS) ×9 IMPLANT
NDL SAFETY ECLIPSE 18X1.5 (NEEDLE) ×1 IMPLANT
NEEDLE HYPO 18GX1.5 SHARP (NEEDLE) ×2
NEEDLE HYPO 22GX1.5 SAFETY (NEEDLE) ×3 IMPLANT
NS IRRIG 1000ML POUR BTL (IV SOLUTION) ×3 IMPLANT
NS IRRIG 500ML POUR BTL (IV SOLUTION) ×3 IMPLANT
PACK LAMINECTOMY NEURO (CUSTOM PROCEDURE TRAY) ×3 IMPLANT
PAD ARMBOARD 7.5X6 YLW CONV (MISCELLANEOUS) ×3 IMPLANT
PROBE PED SCREW MONOP 3 BT NCS (MISCELLANEOUS) ×1 IMPLANT
ROD BENDING DIGITIZER ARRAY ×3 IMPLANT
ROD RELINE-O LORDOTIC 5.5X60MM (Rod) ×3 IMPLANT
SCREW LOCK RELINE 5.5 TULIP (Screw) ×30 IMPLANT
SCREW PROBE PEDICLE MONOPOLAR (MISCELLANEOUS) ×2
SCREW RELINE-O POLY 6.5X45 (Screw) ×12 IMPLANT
SCREW RELINE-O POLY 6.5X50MM (Screw) ×12 IMPLANT
SPOGE SURGIFLO 8M (HEMOSTASIS) ×2
SPONGE KITTNER 5P (MISCELLANEOUS) ×3 IMPLANT
SPONGE SURGIFLO 8M (HEMOSTASIS) ×1 IMPLANT
SURGIFLO W/THROMBIN 8M KIT (HEMOSTASIS) ×6 IMPLANT
SUT NURALON 4 0 TR CR/8 (SUTURE) IMPLANT
SUT POLYSORB 2-0 5X18 GS-10 (SUTURE) ×3 IMPLANT
SUT VIC AB 0 CT1 18XCR BRD 8 (SUTURE) IMPLANT
SUT VIC AB 0 CT1 8-18 (SUTURE)
SUT VIC AB 3-0 SH 8-18 (SUTURE) IMPLANT
SYR 20CC LL (SYRINGE) ×3 IMPLANT
SYR 30ML LL (SYRINGE) ×6 IMPLANT
SYRINGE 10CC LL (SYRINGE) ×6 IMPLANT
TLX IMPLANT 7X11X31 15DEG (Cage) ×3 IMPLANT
TLX IMPLANT 8X11X31 15DEG (Cage) ×3 IMPLANT
TOWEL OR 17X26 4PK STRL BLUE (TOWEL DISPOSABLE) ×6 IMPLANT
TRAY FOLEY W/METER SILVER 16FR (SET/KITS/TRAYS/PACK) IMPLANT
TUBING CONNECTING 10 (TUBING) ×2 IMPLANT
TUBING CONNECTING 10' (TUBING) ×1
reline 0 screw ×8 IMPLANT
reline 0 ti rod ×6 IMPLANT
reline o screw ×12 IMPLANT
reline o ti rod ×2 IMPLANT
tlx implant ×3 IMPLANT

## 2017-04-24 NOTE — Transfer of Care (Signed)
Immediate Anesthesia Transfer of Care Note  Patient: Sherri Dickerson  Procedure(s) Performed: Procedure(s): TRANSFORAMINAL LUMBAR INTERBODY FUSION (TLIF) WITH PEDICLE SCREW FIXATION 3 LEVEL-L3-S1 (Bilateral)  Patient Location: PACU  Anesthesia Type:General  Level of Consciousness: awake  Airway & Oxygen Therapy: Patient Spontanous Breathing and Patient connected to face mask oxygen  Post-op Assessment: Report given to RN, Post -op Vital signs reviewed and stable and Patient moving all extremities X 4  Post vital signs: Reviewed  Last Vitals:  Vitals:   04/24/17 1034 04/24/17 1841  BP: (!) 148/95 101/62  Pulse: 78 98  Resp: 14 18  Temp: 37.4 C 36.6 C  SpO2: 100% 100%    Last Pain:  Vitals:   04/24/17 1034  TempSrc: Tympanic  PainSc: 5          Complications: No apparent anesthesia complications

## 2017-04-24 NOTE — Anesthesia Post-op Follow-up Note (Signed)
Anesthesia QCDR form completed.        

## 2017-04-24 NOTE — Progress Notes (Signed)
Pharmacy consult for Antibiotic Surgical Prophylaxis:  56 yo F  Wt 105.7 kg  AdjBW= 77.9kg  Crcl 96 ml/min  Will order Vancomycin 1250mg  IV x 1 oncall to OR  (dose~ 15 mg/kg)  Will order Cefazolin 2 gm IV x1 oncall to OR.  Sherri Dickerson PharmD Clinical Pharmacist 04/24/2017

## 2017-04-24 NOTE — Interval H&P Note (Signed)
History and Physical Interval Note:  04/24/2017 10:44 AM  Sherri Dickerson  has presented today for surgery, with the diagnosis of Lumbar stenosis with radiculopathy,failed back surgery  The various methods of treatment have been discussed with the patient and family. After consideration of risks, benefits and other options for treatment, the patient has consented to  Procedure(s): TRANSFORAMINAL LUMBAR INTERBODY FUSION (TLIF) WITH PEDICLE SCREW FIXATION 3 LEVEL-L3-S1 (Bilateral) as a surgical intervention .  The patient's history has been reviewed, patient examined, no change in status, stable for surgery.  I have reviewed the patient's chart and labs.  Questions were answered to the patient's satisfaction.     Deetta Perla

## 2017-04-24 NOTE — Anesthesia Procedure Notes (Signed)
Procedure Name: Intubation Date/Time: 04/24/2017 11:52 AM Performed by: Zetta Bills Pre-anesthesia Checklist: Patient identified, Emergency Drugs available, Suction available and Patient being monitored Patient Re-evaluated:Patient Re-evaluated prior to induction Oxygen Delivery Method: Circle system utilized Preoxygenation: Pre-oxygenation with 100% oxygen Induction Type: IV induction Ventilation: Mask ventilation without difficulty Laryngoscope Size: McGraph and 4 Grade View: Grade I Tube type: Oral Tube size: 7.0 mm Number of attempts: 1 Airway Equipment and Method: Video-laryngoscopy and Stylet Placement Confirmation: ETT inserted through vocal cords under direct vision,  positive ETCO2 and breath sounds checked- equal and bilateral Secured at: 21 cm Tube secured with: Tape Dental Injury: Teeth and Oropharynx as per pre-operative assessment

## 2017-04-24 NOTE — H&P (Signed)
Sherri Dickerson is an 56 y.o. female.   Chief Complaint: Left leg pain  HPI: Sherri Dickerson is a 56 yo female with continued severe left leg pain down the lateral thigh and calf. This is associated with back pain. Her previous right leg symptoms had improved after surgery. She had a MRI which showed further collapse of L3/4 and L4/5 with left sided stenosis that explains her symptoms. Since I last saw her, nothing has changed except she feels the pain is worsening and although she continues to work, she has a hard time doing much of anything else.  She went to physical therapy without any significant relief. She has been on multiple medications without pain relief  Past Medical History:  Diagnosis Date  . Anxiety   . Arthritis    osteo generalized  . Cancer [facility]) 2012?   thyroid  . Diabetes mellitus without complication ([place])   . Gastritis   . Headache    migraines  . Hyperlipidemia   . Hypertension   . Hypothyroidism   . Sleep apnea   . Status post radiation therapy    thyroid    Past Surgical History:  Procedure Laterality Date  . ABDOMINAL HYSTERECTOMY  2005  . COLONOSCOPY  2013?   Dr Donnella Sham  . FORAMINOTOMY 2 LEVEL  07/25/2016   Procedure: FORAMINOTOMY 2 LEVEL;  Surgeon: Deetta Perla, MD;  Location: [facility] ORS;  Service: Neurosurgery;;  L4-5, L3-4  . LUMBAR LAMINECTOMY/DECOMPRESSION MICRODISCECTOMY Left 07/25/2016   Procedure: LUMBAR LAMINECTOMY/DECOMPRESSION MICRODISCECTOMY 2 LEVELS;  Surgeon: Deetta Perla, MD;  Location: [facility] ORS;  Service: Neurosurgery;  Laterality: Left;  L3-4 hemilaminectomy and  L4-5 full laminectomy  . THYROIDECTOMY  2012?   Dr Kathyrn Sheriff    Family History  Problem Relation Age of Onset  . Breast cancer Neg Hx    Social History:  reports that she has been smoking Cigarettes.  She has been smoking about 0.25 packs per day. She has never used smokeless tobacco. She reports that she drinks about 4.2 oz of alcohol per week . She reports that she does not use  drugs.  Allergies:  Allergies  Allergen Reactions  . Macrobid [Nitrofurantoin] Other (See Comments)    Flu like symptoms  . Morphine And Related Other (See Comments)    Severe head aches.  . Wellbutrin [Bupropion] Other (See Comments)    Suicidal thoughts    No prescriptions prior to admission.    No results found for this or any previous visit (from the past 48 hour(s)). No results found.  ROS General ROS: Negative Respiratory ROS: Negative Cardiovascular ROS: Negative Gastrointestinal ROS: Negative Genito-Urinary ROS: Negative Musculoskeletal ROS: Positive for back pain Neurological ROS: Positive for leg pain Dermatological ROS: Negative  There were no vitals taken for this visit. Physical Exam  General appearance: Alert, cooperative, in no acute distress Head: Normocephalic, atraumatic Eyes: Normal, EOM intact Oropharynx: Moist without lesions Back: Well healed midline incision CV: Regular rate and rhythm Lung: Clear breath sounds, normal effort Ext: No edema in LE bilaterally  Neurologic exam:  Mental status: alertness: alert, affect: normal Speech: fluent and clear Motor: 5 out of 5 strength in right lower extremity, she is 5 out of 5 strength in the left lower extremity except for EHL which is 4+ out of 5 Sensory: intact to light touch in the lateral lower extremely Reflexes: 1+ at left patella, 2+ right patella Gait: normal   Assessment/Plan Sherri Dickerson has ongoing Left L4 and L5 radicular symptoms and  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 Plan will be L3-S1 fusion with L3/4 and L4/5 TLIF  Patient will be admitted following surgery  Deetta Perla, MD 04/24/2017, 7:12 AM

## 2017-04-24 NOTE — Anesthesia Preprocedure Evaluation (Addendum)
Anesthesia Evaluation  Patient identified by MRN, date of birth, ID band Patient awake    Reviewed: Allergy & Precautions, NPO status , Patient's Chart, lab work & pertinent test results, reviewed documented beta blocker date and time   Airway Mallampati: III  TM Distance: >3 FB     Dental  (+) Chipped   Pulmonary sleep apnea and Continuous Positive Airway Pressure Ventilation , Current Smoker,           Cardiovascular hypertension, Pt. on medications      Neuro/Psych  Headaches, Anxiety    GI/Hepatic   Endo/Other  diabetes, Type 2Hypothyroidism   Renal/GU      Musculoskeletal  (+) Arthritis ,   Abdominal   Peds  Hematology   Anesthesia Other Findings Obese. Can take dilaudid.  Reproductive/Obstetrics                            Anesthesia Physical Anesthesia Plan  ASA: III  Anesthesia Plan: General   Post-op Pain Management:    Induction: Intravenous  PONV Risk Score and Plan:   Airway Management Planned: Oral ETT  Additional Equipment:   Intra-op Plan:   Post-operative Plan:   Informed Consent: I have reviewed the patients History and Physical, chart, labs and discussed the procedure including the risks, benefits and alternatives for the proposed anesthesia with the patient or authorized representative who has indicated his/her understanding and acceptance.     Plan Discussed with: CRNA  Anesthesia Plan Comments:         Anesthesia Quick Evaluation

## 2017-04-25 ENCOUNTER — Inpatient Hospital Stay: Payer: BLUE CROSS/BLUE SHIELD

## 2017-04-25 ENCOUNTER — Encounter: Payer: Self-pay | Admitting: Neurosurgery

## 2017-04-25 LAB — CBC
HCT: 23.8 % — ABNORMAL LOW (ref 35.0–47.0)
Hemoglobin: 8.2 g/dL — ABNORMAL LOW (ref 12.0–16.0)
MCH: 33.3 pg (ref 26.0–34.0)
MCHC: 34.6 g/dL (ref 32.0–36.0)
MCV: 96 fL (ref 80.0–100.0)
Platelets: 165 10*3/uL (ref 150–440)
RBC: 2.48 MIL/uL — ABNORMAL LOW (ref 3.80–5.20)
RDW: 14.1 % (ref 11.5–14.5)
WBC: 8.9 10*3/uL (ref 3.6–11.0)

## 2017-04-25 LAB — GLUCOSE, CAPILLARY
Glucose-Capillary: 98 mg/dL (ref 65–99)
Glucose-Capillary: 135 mg/dL — ABNORMAL HIGH (ref 65–99)
Glucose-Capillary: 119 mg/dL — ABNORMAL HIGH (ref 65–99)
Glucose-Capillary: 139 mg/dL — ABNORMAL HIGH (ref 65–99)
Glucose-Capillary: 114 mg/dL — ABNORMAL HIGH (ref 65–99)

## 2017-04-25 MED ORDER — OXYCODONE HCL 5 MG PO TABS
5.0000 mg | ORAL_TABLET | ORAL | Status: DC | PRN
  Administered 2017-04-25 (×3): 5 mg via ORAL
  Administered 2017-04-26: 10 mg via ORAL
  Administered 2017-04-26 – 2017-04-27 (×8): 5 mg via ORAL
  Filled 2017-04-25 (×8): qty 1
  Filled 2017-04-25: qty 2
  Filled 2017-04-25 (×3): qty 1

## 2017-04-25 MED ORDER — METHOCARBAMOL 500 MG PO TABS
1000.0000 mg | ORAL_TABLET | Freq: Three times a day (TID) | ORAL | Status: DC | PRN
  Administered 2017-04-25 (×2): 1000 mg via ORAL
  Filled 2017-04-25 (×2): qty 2

## 2017-04-25 MED ORDER — HEPARIN SODIUM (PORCINE) 5000 UNIT/ML IJ SOLN
5000.0000 [IU] | Freq: Two times a day (BID) | INTRAMUSCULAR | Status: DC
  Administered 2017-04-25 – 2017-04-27 (×4): 5000 [IU] via SUBCUTANEOUS
  Filled 2017-04-25 (×4): qty 1

## 2017-04-25 MED ORDER — HYDROMORPHONE HCL 1 MG/ML IJ SOLN: 1.0000 mg | INTRAMUSCULAR | Status: DC | PRN

## 2017-04-25 MED ORDER — LIDOCAINE 5 % EX PTCH
1.0000 | MEDICATED_PATCH | CUTANEOUS | Status: DC
  Administered 2017-04-25 – 2017-04-27 (×3): 1 via TRANSDERMAL
  Filled 2017-04-25 (×3): qty 1

## 2017-04-25 MED ORDER — ADULT MULTIVITAMIN W/MINERALS CH
1.0000 | ORAL_TABLET | Freq: Every day | ORAL | Status: DC
  Administered 2017-04-25 – 2017-04-27 (×3): 1 via ORAL
  Filled 2017-04-25 (×4): qty 1

## 2017-04-25 NOTE — Progress Notes (Signed)
Pt bp"s running very low so I paged Dr. Lacinda Axon for another pain medicine but he decided that she could take 5 mg Oxycodone even with low bp's. She is tolerating meds well.

## 2017-04-25 NOTE — Progress Notes (Signed)
Neurosurgery Daily Progress Note 04/25/2017 Sherri Dickerson [id number]  Identifying Statement: Sherri Dickerson is a 56 y.o. female from [place] [id number]  POD#1 from L3-S1 fusion, L3/4, L4/5 TLIF  Overnight Events: Sherri Dickerson had severe pain after surgery in her back, better with PCA and pain medication. She states the left leg pain currently not present. She had her foley catheter removed this AM. She is tolerating liquids. Her hematocrit is lower at 23 this AM but she denies any headache, dizziness. She also is having some numbness in her fingers that has improved some after surgery, in her right hand.   Her drain put out 150 cc overnight.    Physical Exam: BP (!) 95/44   Pulse 79   Temp 97.7 F (36.5 C) (Oral)   Resp 15   Ht 5\' 6"  (1.676 m)   Wt 105.7 kg (233 lb)   SpO2 100%   BMI 37.61 kg/m  Body mass index is 37.61 kg/m. Body surface area is 2.22 meters squared. General appearance: Sleeping on entry to room, appears comfortable  Neurologic exam:  Mental status: alertness: awakens easily,  affect: normal Speech: fluent and clear Motor:strength symmetric 5/5 in all motor groups in bilateral lower extremities Sensory: intact to light touch in lower extremities Back: Hemovac in place with serosanguinous fluid  Laboratory: Results for orders placed or performed during the hospital encounter of 04/24/17  Glucose, capillary  Result Value Ref Range   Glucose-Capillary 94 65 - 99 mg/dL  CBC  Result Value Ref Range   WBC 8.9 3.6 - 11.0 K/uL   RBC 2.48 (L) 3.80 - 5.20 MIL/uL   Hemoglobin 8.2 (L) 12.0 - 16.0 g/dL   HCT 23.8 (L) 35.0 - 47.0 %   MCV 96.0 80.0 - 100.0 fL   MCH 33.3 26.0 - 34.0 pg   MCHC 34.6 32.0 - 36.0 g/dL   RDW 14.1 11.5 - 14.5 %   Platelets 165 150 - 440 K/uL  Hemoglobin and hematocrit, blood  Result Value Ref Range   Hemoglobin 9.4 (L) 12.0 - 16.0 g/dL   HCT 28.0 (L) 35.0 - 47.0 %  Glucose, capillary  Result Value Ref Range   Glucose-Capillary 142 (H)  65 - 99 mg/dL  Glucose, capillary  Result Value Ref Range   Glucose-Capillary 163 (H) 65 - 99 mg/dL   Comment 1 Notify RN   ABO/Rh  Result Value Ref Range   ABO/RH(D) B POS   Prepare RBC (crossmatch)  Result Value Ref Range   Order Confirmation ORDER PROCESSED BY BLOOD BANK    I personally reviewed labs    Assessment/Plan:  Sherri Dickerson is POD#1 from her lumbar decompression and fusion. She is having expected back pain with improved leg pain. Her neurological exam is at baseline.   Plan: - Foley removed, follow up void - Diet advanced, will follow up blood sugars - Ambulate today with PT/OT - May obtain brace for comfort - Hematocrit at 23.8, transfuse 1u PRBC - Pain control, d/c PCA, will start Oxycodone with IV pain med for breakthrough, Lidoderm patch, muscle relaxer, Tylenol - Films today - Keep hemovac today, Ancef while in place

## 2017-04-25 NOTE — Progress Notes (Signed)
PT Cancellation Note  Patient Details Name: Sherri Dickerson MRN: [id number] DOB: [birth year]   Cancelled Treatment:    Reason Eval/Treat Not Completed: Patient at procedure or test/unavailable (Pt currently off floor for x-ray).  Pt scheduled to have blood transfusion this morning as well.  Will attempt to see again later today, schedule permitting.   Collie Siad PT, DPT 04/25/2017, 9:59 AM

## 2017-04-25 NOTE — Progress Notes (Signed)
OT Cancellation Note  Patient Details Name: Sherri Dickerson MRN: [id number] DOB: 05-04-1961   Cancelled Treatment:    Reason Eval/Treat Not Completed: Other (comment). Order received, chart reviewed. Pt with nursing after x-ray. Pt scheduled to have blood transfusion this morning.  Will attempt to see again later today, schedule permitting for OT evaluation.  Jeni Salles, MPH, MS, OTR/L ascom [phone] 04/25/17, 10:49 AM

## 2017-04-25 NOTE — Op Note (Addendum)
Operative Note  SURGERY DATE: 04/24/2017  PRE-OP DIAGNOSIS: Lumbar Stenosis [M48.061] ,  Post Laminectomy Syndrome (M96.1) Lumbar Radiculopathy (M54.16)  POST-OP DIAGNOSIS: Lumbar Stenosis [M48.061] ,  Post Laminectomy Syndrome (M96.1) Lumbar Radiculopathy (M54.16)  Procedure(s) with comments: 1. L3-L5 Secondary Decompression 2. L3/4 Transforaminal Lumbar Interbody Fusion 3. L4/5 Transforaminal Lumbar Interbody Fusion 4. L3, L4, L5, S1 Bilateral Pedicle Screw Fixation 5. Posterolateral Fusion L3-S1 6. Arthrodesis L3-S1   SURGEON:  * Sherri Gauze, MD  Sherri Olp, PA, Assistant  ANESTHESIA:General anesthesia  OPERATIVE FINDINGS: Lumbar Stenosis at L3/4 and L4/5 with severe bilateral neuroforaminal stenosis at L4/5 and severe left neuroforaminal stenosis at L3/4   OPERATIVE REPORT:   Indication: Ms. McVeypresented to the clinic on 8/14with ongoing left leg pain in a L3 and L4 dermatome. She had failed conservative management after undergoing a previous L3-L5 decompression. She failed physical therapy, OTC medications, and prescription medications. Imaging revealed high grade stenosis at L3/4, L4/5 and moderate at L5/S1 with listhesis at L4/5. We discussed a L3-S1 fusion and secondary decompression to treat her radiculopathy.  We discussed herisks of surgery including hematoma, infection, damage to nerve resulting in weakness or numbness, need for additional surgery, heart attack, and stroke. We discussed using neuro-monitoring to decrease the risk. She elected to proceed with surgery for symptom relief.   Procedure The patient was brought to the OR after informed consent was obtained. She was given general anesthesia and intubated by the anesthesia service.Vascular access lines and a foley catheter were placed. Neuromonitoring electrodes were placed for EMG in lower extremities. The patient was then placed prone on the [facility] table ensuring all  pressure points were padded. The patient was prepped and draped in sterile fashion. The previous incision was marked. Antibiotics had been delivered.  A time-out was performed per protocol.    Local anesthetic was placed in the planned incision. The skin was opened sharply and the dissection taken through the fascia until the spinous processes were identified. A subperiosteal dissection allowed for identification of the facets laterally. The fluoroscopy was used to identify the L3 level and the exposure was taken caudally to identify the sacrum. Next, the transverse processes were identified bilaterally and retractors were placed.   Using the pars/transverse process interface, a pilot hole was drilled at the L3 level starting on the right. A straight probe was used to cannulate the pedicle to a depth of 40 mm. The soft tip probe was then used to ensure there were no breaches. A 5.90mm tapping screw was used next and then trajectory again probed for breaches. Lastly, given no breaches found, screws were placed at the L3 level.  6.5 mm x 45 mm screws were placed without incident.  The screws were stimulated and all thresholds were higher than  37mA. Fluoroscopy was used to confirm placement.    The same process was used at the L4 level placing 6.20mm x 63mm screws bilaterally and then at the L5 level placing 6.8mm x 58mm screws. Finally, the S1 screws were placed under fluoroscopy starting lateral to the facet with a trajectory aimed at anterior edge of the promontory to ensure good purchase. 6.69mm x 79mm screws were placed at that level. Fluoroscopy again confirmed adequate placement of all screws.    Next, attention was turned to the foraminal decompression. A rod was placed on the right and distraction sed to expand the L4.5 space.  The inferior portion of L4 lamina  removed to expose the ligamentum flavum.  Because of  this there was some scar tissue noted and this was removed down to the top of the remaining  L5 lamina.  Central stenosis was noted to be relieved once all ligament and scar tissue was removed out to the lateral recesses.  On the left, the inferior facet of L3 was then removed followed by the superior facet at L4 using the drill and osteotome. The bone was removed until the pedicles of both levels could be identified. The exiting nerve root was identified and protected. Significant synovial tissue was removed along the nerve. It was followed laterally to ensure it was decompressed throughout its course. The traversing nerve root was also seen to be free and was followed inferiorly to ensure no compression was felt.  On the right side the drill was used to remove the bone including the medial facets as well as the  inferior articulating process of L4 This allowed for visualization of the exiting nerve root ensuring decompression.    On the right, the traversing L5 nerve root was identified and retracted medially. The disc space was coagulated and then entered sharply. A combination of shavers and rasps were used to remove the disc and prepare the endplates. Once the disc was cleared, a 7-11 mm expandable cage was placed ensuring that it rested anteriorly and in the midline. Fluoroscopy was used to confirm placement. Bone graft was then placed within the disc space and within the interbody cage.  Next, the foraminal decompression at L3/4 was performed. Using a similar procedure, the inferior lamina and facet were removed at L3 and the superior facet of L4. The pedicles were identified and the traversing nerve root identified. Synovial tissue was removed from the exiting L3 nerve root until it was free. The traversing L4 nerve root was identified and retracted medially. The disc space was coagulated and then entered sharply. A combination of shavers and rasps were used to remove the disc and prepare the endplates. Once the disc was cleared, a 8-12 mm expandable cage was placed ensuring that it rested  anteriorly and in the midline. Fluoroscopy was used to confirm placement. Bone graft was then placed within the disc space and within the interbody cage.  Next, the Irwin system was used to obtain screw points for rod measurements. Two rods were cut and bent to dimensions and placed bilaterally. Set screws were placed and torqued. Final x-ray images were obtained. Neuromonitoring remained stable.   A hemovac drain was placed and tunneled subfascially to the skin. The wound was irrigated with antibiotic laden saline and hemostasis was achieved. The drill was used to decorticate remaining lamina and transverse processes. Bone graft was then placed laterally over the transverse process to promote posterolateral arthrodesis. The muscle was then closed using 0 vicryl followed by the fascia with 0 vicryl. Vancomycin powder was placed in the wound.  Next, multiple subcutaneous and dermal layers were closed with 2-0 vicryl until the epidermis was well approximated. The skin was closed with 3-0 Nylon. A dressing was applied.  The patient was returned to supine position and extubated by the anesthesia service. The patient was then taken to the PACU for post-operative care where she was moving extremities symmetrically. I discussed the results with the family and all questions were answered.   ESTIMATED BLOOD LOSS: 1000 cc  SPECIMENS None  IMPLANT  Implant Name Type Inv. Item Serial No. Manufacturer Lot No. LRB No. Used  MATRIX, BONE CELLULAR OSTEOCEL PRO LRG - [id number]  MATRIX, BONE CELLULAR OSTEOCEL PRO  LRG [id number] NUVASIVE  N/A 1  ALLOGRAFT, CHIP CANCELLOUS PRESERVON 20 - [id number]  ALLOGRAFT, CHIP CANCELLOUS PRESERVON 20  LIFENET  N/A 1  SCREW, POLYAXIAL RELINE-O 6.5X45MM - [id number]  SCREW, POLYAXIAL RELINE-O 6.5X45MM [id number] NUVASIVE  N/A 4  SCREW, POLYAXIAL RELINE-O 6.5X50MM - [id number]  SCREW, POLYAXIAL RELINE-O 6.5X50MM [id number] NUVASIVE  N/A 4  ROD, LORDOTIC RELINE TI 5.5X60MM -  [id number]  ROD, LORDOTIC RELINE TI 5.5X45MM [id number] NUVASIVE  N/A 3   SCREW, RELINE LOCK OPEN TULIP 5.5MM - [id number]  SCREW, RELINE LOCK OPEN TULIP 5.5MM [id number] NUVASIVE  N/A 10  IMPLANT, TLX 15D 7X11X31MM - [id number]   IMPLANT, TLX 15D 7X11X31MM [id number] NUVASIVE   N/A 1   IMPLANT, TLX 15D 8X11X31MM - [id number]   IMPLANT, TLX 15D [id number] NUVASIVE   N/A 1     I performed the case in its entirety with the assistance of Sherri Dickerson, [place],   Deetta Perla, [place]

## 2017-04-25 NOTE — Progress Notes (Signed)
PCA discontinued a.m. Wasted 21 ml and tubing cut. Witnessed with York Cerise.

## 2017-04-25 NOTE — Evaluation (Signed)
Physical Therapy Evaluation Patient Details Name: Sherri Dickerson MRN: [id number] DOB: 02/08/1961 Today's Date: 04/25/2017   History of Present Illness  Pt is a 56 y/o F who presented with failed back surgery with diagnosis of lumbar stenosis.  She is now s/p L3-S1 fusion, L3/4, L4/5 TLIF.  Pt's PMH includes Foraminotomy and lumbar laminectomy/decompression in 2017, cancer.    Clinical Impression  Patient is s/p above surgery resulting in functional limitations due to the deficits listed below (see PT Problem List). Ms. Memon presents with very guarded posture due to pain at surgical site.  Provided pt with back precaution handout and educated pt on back precautions.  Pt performed log roll technique with cues from therapist.  She currently requires 1 person hand held assist when ambulating without AD and thus a RW was introduced.  Patient will benefit from skilled PT to increase their independence and safety with mobility to allow discharge to the venue listed below.      Follow Up Recommendations No PT follow up;Supervision for mobility/OOB    Equipment Recommendations  Rolling walker with 5" wheels    Recommendations for Other Services OT consult     Precautions / Restrictions Precautions Precautions: Fall;Back Precaution Booklet Issued: Yes (comment) Precaution Comments: Provided pt with back precaution handout and educated pt on back precautions. Required Braces or Orthoses:  (Back brace for comfort, per chart review not required) Restrictions Weight Bearing Restrictions: No      Mobility  Bed Mobility Overal bed mobility: Needs Assistance Bed Mobility: Rolling;Sidelying to Sit Rolling: Min guard Sidelying to sit: Min guard;HOB elevated       General bed mobility comments: Cues for log roll technique which pt performs without assist but does require use of bed rail with HOB slightly elevated.  Transfers Overall transfer level: Needs assistance Equipment used: Rolling  walker (2 wheeled) Transfers: Sit to/from Stand Sit to Stand: Min guard;From elevated surface         General transfer comment: Elevated bed to height of bed at home.  Cues for hand placement with sit<>stand.  Increased effort and slow to stand with instability noted.    Ambulation/Gait Ambulation/Gait assistance: Min assist;Min guard Ambulation Distance (Feet): 40 Feet Assistive device: Rolling walker (2 wheeled);None Gait Pattern/deviations: Step-through pattern;Decreased stride length;Decreased dorsiflexion - right;Decreased dorsiflexion - left;Antalgic Gait velocity: decreased Gait velocity interpretation: Below normal speed for age/gender General Gait Details: Pt ambulates first few steps without AD, requiring 1 person HHA to remain steady.  RW was introduced with pt only requiring min guard assist for safety.  Pt with slow but steady gait.  Pt demonstrates significantly guarded posture.    Stairs            Wheelchair Mobility    Modified Rankin (Stroke Patients Only)       Balance Overall balance assessment: Needs assistance Sitting-balance support: No upper extremity supported;Feet supported Sitting balance-Leahy Scale: Good     Standing balance support: No upper extremity supported;During functional activity Standing balance-Leahy Scale: Fair Standing balance comment: Pt able to stand statically without UE support but requires UE support for dynamic activities or any perturbation                             Pertinent Vitals/Pain Pain Assessment: 0-10 Pain Score: 6  Pain Location: surgical site Pain Descriptors / Indicators: Aching;Discomfort;Grimacing;Guarding Pain Intervention(s): Limited activity within patient's tolerance;Monitored during session;Repositioned;Utilized relaxation techniques    Home Living  Family/patient expects to be discharged to:: Private residence Living Arrangements: Spouse/significant other;Children (daughter) Available  Help at Discharge: Family;Available PRN/intermittently Type of Home: House Home Access: Stairs to enter Entrance Stairs-Rails: Left Entrance Stairs-Number of Steps: 6 Home Layout: One level;Laundry 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  Pt ambulates without AD.  Independent with all ADLs.     Hand Dominance   Dominant Hand: Left    Extremity/Trunk Assessment   Upper Extremity Assessment Upper Extremity Assessment: Defer to OT evaluation    Lower Extremity Assessment Lower Extremity Assessment: LLE deficits/detail LLE Deficits / Details: LLE strength grossly 4/5 at knee and hip although formal assessment limited as pt s/p back surgery LLE: Unable to fully assess due to pain LLE Sensation:  (WNL)    Cervical / Trunk Assessment Cervical / Trunk Assessment: Other exceptions Cervical / Trunk Exceptions: s/p back surgery as documented above  Communication   Communication: No difficulties  Cognition Arousal/Alertness: Awake/alert Behavior During Therapy: WFL for tasks assessed/performed Overall Cognitive Status: Within Functional Limits for tasks assessed                                        General Comments General comments (skin integrity, edema, etc.): BP supine at start of session 96/52.  Pt denies dizzines/lightheadedness throughout session.    Exercises Other Exercises Other Exercises: Educated pt on the importance of having her feet supported when sitting in a chair to decrease stress on her lower back.   Other Exercises: Educated pt on the importance of using chairs with armrests and chair that are higher and not too low for ease with transfers.    Assessment/Plan    PT Assessment Patient needs continued PT services  PT Problem List Decreased strength;Decreased activity tolerance;Decreased  balance;Decreased range of motion;Decreased mobility;Decreased knowledge of use of DME;Decreased safety awareness;Decreased knowledge of precautions;Pain       PT Treatment Interventions DME instruction;Gait training;Stair training;Functional mobility training;Therapeutic activities;Therapeutic exercise;Balance training;Neuromuscular re-education;Patient/family education;Modalities    PT Goals (Current goals can be found in the Care Plan section)  Acute Rehab PT Goals Patient Stated Goal: to go home PT Goal Formulation: With patient Time For Goal Achievement: 05/09/17 Potential to Achieve Goals: Good    Frequency BID   Barriers to discharge Inaccessible home environment Has steps to enter home    Co-evaluation               AM-PAC PT "6 Clicks" Daily Activity  Outcome Measure Difficulty turning over in bed (including adjusting bedclothes, sheets and blankets)?: Unable Difficulty moving from lying on back to sitting on the side of the bed? : Unable Difficulty sitting down on and standing up from a chair with arms (e.g., wheelchair, bedside commode, etc,.)?: A Lot Help needed moving to and from a bed to chair (including a wheelchair)?: A Little Help needed walking in hospital room?: A Little Help needed climbing 3-5 steps with a railing? : A Little 6 Click Score: 13    End of Session Equipment Utilized During Treatment: Gait belt Activity Tolerance: Patient tolerated treatment well;Patient limited by pain Patient left: in chair;with call bell/phone within reach;with chair alarm set;with family/visitor present Nurse Communication: Mobility status PT Visit  Diagnosis: Pain;Other abnormalities of gait and mobility (R26.89);Unsteadiness on feet (R26.81) Pain - Right/Left:  (middle) Pain - part of body:  (Back)    Time: 9969-2493 PT Time Calculation (min) (ACUTE ONLY): 32 min   Charges:   PT Evaluation $PT Eval Low Complexity: 1 Low PT Treatments $Gait Training: 8-22  mins   PT G Codes:   PT G-Codes **NOT FOR INPATIENT CLASS** Functional Assessment Tool Used: AM-PAC 6 Clicks Basic Mobility;Clinical judgement Functional Limitation: Mobility: Walking and moving around Mobility: Walking and Moving Around Current Status (S4199): At least 40 percent but less than 60 percent impaired, limited or restricted Mobility: Walking and Moving Around Goal Status [phone]): At least 1 percent but less than 20 percent impaired, limited or restricted    Collie Siad PT, DPT 04/25/2017, 4:52 PM

## 2017-04-25 NOTE — Progress Notes (Signed)
  History: Sherri Dickerson is POD1 s/p  L3-S1 fusion, L3/4, L4/5 TLIF. Patient receiving imaging and 1 unit of blood. Pain is currently 6/10. She is able to tolerate solid foods with mild nausea. Has been voiding in bed pan. Able to bear weight (briefly stood and walked with radiology). Complains of headache.  Physical Exam: Vitals:   04/25/17 1115 04/25/17 1227  BP: (!) 93/59 (!) 101/54  Pulse: 90 91  Resp: 18 18  Temp: 97.9 F (36.6 C) 98.4 F (36.9 C)  SpO2: 100% 100%    AA Ox3 CNI Strength 5/5 BLE   Data:  No results for input(s): NA, K, CL, CO2, BUN, CREATININE, LABGLOM, GLUCOSE, CALCIUM in the last 168 hours. No results for input(s): AST, ALT, ALKPHOS in the last 168 hours.  Invalid input(s): TBILI    Recent Labs Lab 04/25/17 0457  WBC 8.9  HGB 8.2*  HCT 23.8*  PLT 165   No results for input(s): APTT, INR in the last 168 hours.       Other tests/results:Results reviewed with Dr. Lacinda Axon FINDINGS: Upright AP and lateral views. Sequelae of posterior decompression and posterior and interbody fusion at L3-L4 and L4-L5. Posterior decompression and fusion but no interbody implant at L5-S1. Hardware appears intact. Grossly normal hardware positioning. Posterior postoperative drain in place.  Satisfactory vertebral height and alignment. Mild retrolisthesis at L3-L4 appears stable while that at L4-L5 appears regressed. Stable L2-L3 alignment, disc space loss and anterior endplate spurring.  IMPRESSION: Sequelae of lumbar posterior decompression and fusion from the L3 to the S1 level. No adverse features identified.  Assessment/Plan:  Sherri Dickerson is doing well on POD1. 1 unit of blood has been transfused, pain is 6/10, able to void in bedpan, solid food diet tolerated well. Plan:   -PT/OT. Ambulate BID - Monitor drain output.  - Continue pain management -DVT prophylaxis  Marin Olp PA-C Department of Neurosurgery

## 2017-04-26 LAB — BASIC METABOLIC PANEL
Anion gap: 6 (ref 5–15)
BUN: 10 mg/dL (ref 6–20)
CO2: 25 mmol/L (ref 22–32)
Calcium: 8.2 mg/dL — ABNORMAL LOW (ref 8.9–10.3)
Chloride: 111 mmol/L (ref 101–111)
Creatinine, Ser: 0.59 mg/dL (ref 0.44–1.00)
GFR calc Af Amer: >60 mL/min (ref >60)
GFR calc non Af Amer: >60 mL/min (ref >60)
Glucose, Bld: 135 mg/dL — ABNORMAL HIGH (ref 65–99)
Potassium: 3.5 mmol/L (ref 3.5–5.1)
Sodium: 142 mmol/L (ref 135–145)

## 2017-04-26 LAB — CBC
HCT: 23.7 % — ABNORMAL LOW (ref 35.0–47.0)
Hemoglobin: 8.2 g/dL — ABNORMAL LOW (ref 12.0–16.0)
MCH: 31.8 pg (ref 26.0–34.0)
MCHC: 34.5 g/dL (ref 32.0–36.0)
MCV: 92.2 fL (ref 80.0–100.0)
Platelets: 139 10*3/uL — ABNORMAL LOW (ref 150–440)
RBC: 2.57 MIL/uL — ABNORMAL LOW (ref 3.80–5.20)
RDW: 16.2 % — ABNORMAL HIGH (ref 11.5–14.5)
WBC: 8.5 10*3/uL (ref 3.6–11.0)

## 2017-04-26 LAB — GLUCOSE, CAPILLARY
Glucose-Capillary: 120 mg/dL — ABNORMAL HIGH (ref 65–99)
Glucose-Capillary: 126 mg/dL — ABNORMAL HIGH (ref 65–99)
Glucose-Capillary: 126 mg/dL — ABNORMAL HIGH (ref 65–99)
Glucose-Capillary: 115 mg/dL — ABNORMAL HIGH (ref 65–99)

## 2017-04-26 LAB — PREPARE RBC (CROSSMATCH)

## 2017-04-26 MED ORDER — SODIUM CHLORIDE 0.9 % IV SOLN
Freq: Once | INTRAVENOUS | Status: AC
  Administered 2017-04-26: 11:00:00 via INTRAVENOUS

## 2017-04-26 NOTE — Progress Notes (Signed)
  History: Sherri Dickerson is POD2 s/p L3-Si fusion, L3/4, L4/5 TLIF. Patient is doing well. This morning she was just waking up and pain was 4/10, consisting of mild bilateral leg pain and back pain. Patient still complains of leg pain this evening - throbbing, 4/10  Physical Exam: Vitals:   04/26/17 1159 04/26/17 1630  BP: 99/60 (!) 117/59  Pulse: 95 (!) 101  Resp: 18 20  Temp: 98.5 F (36.9 C) 99.2 F (37.3 C)  SpO2: 99% 96%    AA Ox3 CNI Skin: pale Strength:5/5 throughout, sensation intact  Data:   Recent Labs Lab 04/26/17 0514  NA 142  K 3.5  CL 111  CO2 25  BUN 10  CREATININE 0.59  GLUCOSE 135*  CALCIUM 8.2*   No results for input(s): AST, ALT, ALKPHOS in the last 168 hours.  Invalid input(s): TBILI    Recent Labs Lab 04/25/17 0457 04/26/17 0514  WBC 8.9 8.5  HGB 8.2* 8.2*  HCT 23.8* 23.7*  PLT 165 139*   No results for input(s): APTT, INR in the last 168 hours.      Assessment/Plan:  Sherri Dickerson is POD2 s/p L3-Si fusion, L3/4, L4/5 TLIF. Overall, she continues to do well. Pain is controlled with oxycodone and robaxin. Patient was given 1 unit of blood due to combination of drain output and morning labs. This evening, patient is feeling and looking much better. Continue pain control, PT, OT, and pain management. Patient voiding and ambulating well.   Marin Olp PA-C Department of Neurosurgery

## 2017-04-26 NOTE — Care Management Note (Signed)
Case Management Note  Patient Details  Name: Sherri Dickerson MRN: [id number] Date of Birth: 10/28/1960  Subjective/Objective: RNCM consult for discharge planning. PT has recommended no follow up and no PT needs. Will close case.                    Action/Plan:   Expected Discharge Date:  04/27/17               Expected Discharge Plan:  Home/Self Care  In-House Referral:     Discharge planning Services  CM Consult  Post Acute Care Choice:    Choice offered to:     DME Arranged:    DME Agency:     HH Arranged:    HH Agency:     Status of Service:  Completed, signed off  If discussed at [employer] of Stay Meetings, dates discussed:    Additional Comments:  Jolly Mango, RN 04/26/2017, 9:38 AM

## 2017-04-26 NOTE — Anesthesia Postprocedure Evaluation (Signed)
Anesthesia Post Note  Patient: Sherri Dickerson  Procedure(s) Performed: Procedure(s) (LRB): TRANSFORAMINAL LUMBAR INTERBODY FUSION (TLIF) WITH PEDICLE SCREW FIXATION 3 LEVEL-L3-S1 (Bilateral)  Patient location during evaluation: PACU Anesthesia Type: General Level of consciousness: awake and alert and oriented Pain management: pain level controlled Vital Signs Assessment: post-procedure vital signs reviewed and stable Respiratory status: spontaneous breathing Cardiovascular status: blood pressure returned to baseline Anesthetic complications: no     Last Vitals:  Vitals:   04/26/17 0428 04/26/17 0724  BP: 107/79 (!) 111/53  Pulse: (!) 105 99  Resp: 18   Temp: 37.4 C 36.9 C  SpO2: 96% 99%    Last Pain:  Vitals:   04/26/17 0724  TempSrc: Oral  PainSc:                  [name]

## 2017-04-26 NOTE — Evaluation (Signed)
Occupational Therapy Evaluation Patient Details Name: Sherri Dickerson MRN: [id number] DOB: 08-08-1961 Today's Date: 04/26/2017    History of Present Illness Pt is a 56 y/o F who presented with failed back surgery with diagnosis of lumbar stenosis.  She is now s/p L3-S1 fusion, L3/4, L4/5 TLIF.  Pt's PMH includes Foraminotomy and lumbar laminectomy/decompression in 2017, cancer.   Clinical Impression   Pt seen for OT evaluation this date. Pt receiving blood transfusion during session, VSS, no reports of dizziness/lightheadedness/nausea during session. Prior to hospital admission, pt was independent and she is eager to return to PLOF. Pt lives with her spouse and daughter who will be able to provide assist to pt when she goes home. Currently pt is min-mod assist for LB dressing, min guard for mobility with RW. Pt educated in use of reacher which she has access to at home for LB dressing tasks to support back precautions and pt verbalized understanding. Pt would benefit from skilled OT to address noted impairments and functional limitations (see below for any additional details).  Upon hospital discharge, recommend pt discharge home. Do not anticipate additional OT needs after discharge.     Follow Up Recommendations  No OT follow up    Equipment Recommendations       Recommendations for Other Services       Precautions / Restrictions Precautions Precautions: Fall;Back Precaution Booklet Issued: Yes (comment) Precaution Comments: Pt able to recall back precautions without assist or cues Required Braces or Orthoses:  (back brace for comfort, not required) Restrictions Weight Bearing Restrictions: No      Mobility Bed Mobility       General bed mobility comments: deferred pt up in recliner  Transfers Overall transfer level: Needs assistance Equipment used: Rolling walker (2 wheeled) Transfers: Sit to/from Stand Sit to Stand: Min guard         General transfer comment: VC for  hand placement, increased effort, slow to stand, no LOB    Balance Overall balance assessment: Needs assistance Sitting-balance support: No upper extremity supported;Feet supported Sitting balance-Leahy Scale: Good     Standing balance support: No upper extremity supported;During functional activity Standing balance-Leahy Scale: Fair Standing balance comment: Pt able to stand statically without UE support but requires UE support for dynamic activities or any perturbation                           ADL either performed or assessed with clinical judgement   ADL Overall ADL's : Needs assistance/impaired Eating/Feeding: Set up;Sitting   Grooming: Standing;Min guard   Upper Body Bathing: Minimal assistance;Sitting;Set up   Lower Body Bathing: Sitting/lateral leans;Sit to/from stand;Moderate assistance;Set up   Upper Body Dressing : Sitting;Set up   Lower Body Dressing: Moderate assistance;Sitting/lateral leans;Sit to/from stand;With adaptive equipment Lower Body Dressing Details (indicate cue type and reason): pt educated in use of reacher for LB dressing tasks with verbal instruction and visual demo, verbalized understanding, agreeable to trialing next date Toilet Transfer: Min guard;Ambulation;Regular Toilet;Grab bars;RW Armed forces technical officer Details (indicate cue type and reason): L grab bar used Toileting- Water quality scientist and Hygiene: Min guard;Sit to/from stand Toileting - Clothing Manipulation Details (indicate cue type and reason): min guard in standing while pt performed hygiene     Functional mobility during ADLs: Rolling walker;Min guard       Vision Baseline Vision/History: Wears glasses Wears Glasses: At all times Patient Visual Report: No change from baseline Vision Assessment?: No apparent visual deficits  Perception     Praxis      Pertinent Vitals/Pain Pain Assessment: 0-10 Pain Score: 7  Pain Location: BLE, L>R  Pain Descriptors /  Indicators: Aching;Grimacing Pain Intervention(s): Patient requesting pain meds-RN notified;Repositioned;Limited activity within patient's tolerance;Monitored during session     Hand Dominance Left   Extremity/Trunk Assessment Upper Extremity Assessment Upper Extremity Assessment: Overall WFL for tasks assessed   Lower Extremity Assessment Lower Extremity Assessment: Defer to PT evaluation;LLE deficits/detail   Cervical / Trunk Assessment Cervical / Trunk Assessment: Other exceptions Cervical / Trunk Exceptions: s/p back surgery as documented above   Communication Communication Communication: No difficulties   Cognition Arousal/Alertness: Awake/alert Behavior During Therapy: WFL for tasks assessed/performed Overall Cognitive Status: Within Functional Limits for tasks assessed                                     General Comments      Exercises Other Exercises Other Exercises: pt educated in home/routines modifications to support no bending/lifting/twisting for self care tasks. pt verbalized understanding    Shoulder Instructions      Home Living Family/patient expects to be discharged to:: Private residence Living Arrangements: Spouse/significant other;Children (daughter) Available Help at Discharge: Family;Available PRN/intermittently Type of Home: House Home Access: Stairs to enter [employer] of Steps: 6 Entrance Stairs-Rails: Left Home Layout: One level;Laundry or work area in [employer] Shower/Tub: Tub/shower unit;Door   [employer] Toilet: Programmer, systems: No   Home Equipment: Civil engineer, contracting - built in          Prior Functioning/Environment Level of Independence: Independent        Comments: Pt denies any falls in the past 6 months.  Pt ambulates without AD.  Independent with all ADLs.        OT Problem List: Decreased strength;Pain;Decreased range of motion;Decreased activity tolerance;Impaired  balance (sitting and/or standing);Decreased knowledge of use of DME or AE      OT Treatment/Interventions: Self-care/ADL training;Energy conservation;DME and/or AE instruction;Patient/family education    OT Goals(Current goals can be found in the care plan section) Acute Rehab OT Goals Patient Stated Goal: to go home OT Goal Formulation: With patient Time For Goal Achievement: 05/03/17 Potential to Achieve Goals: Good  OT Frequency: Min 1X/week   Barriers to D/C:            Co-evaluation              AM-PAC PT "6 Clicks" Daily Activity     Outcome Measure Help from another person eating meals?: None Help from another person taking care of personal grooming?: None Help from another person toileting, which includes using toliet, bedpan, or urinal?: A Little Help from another person bathing (including washing, rinsing, drying)?: A Little Help from another person to put on and taking off regular upper body clothing?: A Little Help from another person to put on and taking off regular lower body clothing?: A Little 6 Click Score: 20   End of Session Equipment Utilized During Treatment: Gait belt;Rolling walker Nurse Communication: Patient requests pain meds  Activity Tolerance: Patient tolerated treatment well Patient left: in chair;with call bell/phone within reach;with chair alarm set  OT Visit Diagnosis: Other abnormalities of gait and mobility (R26.89);Pain Pain - Right/Left: Left (both, L>R) Pain - part of body: Leg                Time: 6578-4696  OT Time Calculation (min): 16 min Charges:  OT General Charges $OT Visit: 1 Visit OT Evaluation $OT Eval Low Complexity: 1 Low G-Codes: OT G-codes **NOT FOR INPATIENT CLASS** Functional Assessment Tool Used: AM-PAC 6 Clicks Daily Activity;Clinical judgement Functional Limitation: Self care Self Care Current Status (Y5035): At least 20 percent but less than 40 percent impaired, limited or restricted Self Care Goal Status  (W6568): At least 1 percent but less than 20 percent impaired, limited or restricted   Jeni Salles, MPH, MS, OTR/L ascom [phone] 04/26/17, 11:44 AM

## 2017-04-26 NOTE — Progress Notes (Signed)
Physical Therapy Treatment Patient Details Name: Sherri Dickerson MRN: [id number] DOB: 04/22/1961 Today's Date: 04/26/2017    History of Present Illness Pt is a 56 y/o F who presented with failed back surgery with diagnosis of lumbar stenosis.  She is now s/p L3-S1 fusion, L3/4, L4/5 TLIF.  Pt's PMH includes Foraminotomy and lumbar laminectomy/decompression in 2017, cancer.    PT Comments    Ms. Obey is making good progress with mobility.  Unfortunately pt was experiencing some LLE pain today but was provided pain medication by RN prior to session and was agreeable to working with PT.  Her pain improved with mobility and pt was instructed on the importance of mobility as documented below.  She ambulated 60 ft with min guard for safety and cues for improved gait mechanics.  Will continue to follow pt acutely.    Follow Up Recommendations  No PT follow up;Supervision for mobility/OOB     Equipment Recommendations  Rolling walker with 5" wheels    Recommendations for Other Services OT consult     Precautions / Restrictions Precautions Precautions: Fall;Back Precaution Booklet Issued: Yes (comment) Precaution Comments: Pt able to recall back precautions without assist or cues Required Braces or Orthoses:  (Back brace for comfort, per chart review not required) Restrictions Weight Bearing Restrictions: No    Mobility  Bed Mobility Overal bed mobility: Needs Assistance Bed Mobility: Rolling;Sidelying to Sit Rolling: Min guard Sidelying to sit: Min guard       General bed mobility comments: HOB flat and no use of rail to simulate home environment.  Pt requires increased time and min verbal cues for log roll technique.  Min guard provided to ensure safety EOB.    Transfers Overall transfer level: Needs assistance Equipment used: Rolling walker (2 wheeled) Transfers: Sit to/from Stand Sit to Stand: Min guard;From elevated surface         General transfer comment: Elevated  bed to height of bed at home.  Cues for hand placement with sit<>stand.  Increased effort and slow to stand.   Ambulation/Gait Ambulation/Gait assistance: Min guard Ambulation Distance (Feet): 70 Feet Assistive device: Rolling walker (2 wheeled) Gait Pattern/deviations: Step-through pattern;Decreased stride length;Decreased dorsiflexion - right;Decreased dorsiflexion - left;Antalgic Gait velocity: decreased Gait velocity interpretation: Below normal speed for age/gender General Gait Details: Cues and demonstration for Bil heel strike as pt initially landing flat footed and shuffling her gait.  She continues to demonstrate a guarded posture but this improved greatly compared to previous session.     Stairs            Wheelchair Mobility    Modified Rankin (Stroke Patients Only)       Balance Overall balance assessment: Needs assistance Sitting-balance support: No upper extremity supported;Feet supported Sitting balance-Leahy Scale: Good     Standing balance support: No upper extremity supported;During functional activity Standing balance-Leahy Scale: Fair Standing balance comment: Pt able to stand statically without UE support but requires UE support for dynamic activities or any perturbation                            Cognition Arousal/Alertness: Awake/alert Behavior During Therapy: WFL for tasks assessed/performed Overall Cognitive Status: Within Functional Limits for tasks assessed  Exercises Other Exercises Other Exercises: Encouraged pt to ambualte with nursing staff at least 3x/day while in hospital Other Exercises: Instructed pt that when she returns home she needs to walk inside home at least every 45 minutes to prevent stiffness and increased pain.      General Comments General comments (skin integrity, edema, etc.): BP supine at start of session 126/58.        Pertinent Vitals/Pain Pain  Assessment: 0-10 Pain Score: 6  Pain Location: LLE>low back Pain Descriptors / Indicators: Aching;Discomfort;Grimacing;Guarding;Moaning Pain Intervention(s): Limited activity within patient's tolerance;Monitored during session;Repositioned;Premedicated before session;Utilized relaxation techniques    Home Living                      Prior Function            PT Goals (current goals can now be found in the care plan section) Acute Rehab PT Goals Patient Stated Goal: to go home PT Goal Formulation: With patient Time For Goal Achievement: 05/09/17 Potential to Achieve Goals: Good Progress towards PT goals: Progressing toward goals    Frequency    7X/week      PT Plan Current plan remains appropriate    Co-evaluation              AM-PAC PT "6 Clicks" Daily Activity  Outcome Measure  Difficulty turning over in bed (including adjusting bedclothes, sheets and blankets)?: A Little Difficulty moving from lying on back to sitting on the side of the bed? : A Little Difficulty sitting down on and standing up from a chair with arms (e.g., wheelchair, bedside commode, etc,.)?: A Little Help needed moving to and from a bed to chair (including a wheelchair)?: A Little Help needed walking in hospital room?: A Little Help needed climbing 3-5 steps with a railing? : A Little 6 Click Score: 18    End of Session Equipment Utilized During Treatment: Gait belt Activity Tolerance: Patient tolerated treatment well;Patient limited by pain Patient left: in chair;with call bell/phone within reach;with chair alarm set Nurse Communication: Mobility status;Other (comment) (BP) PT Visit Diagnosis: Pain;Other abnormalities of gait and mobility (R26.89);Unsteadiness on feet (R26.81) Pain - Right/Left:  (middle) Pain - part of body:  (Back)     Time: 2836-6294 PT Time Calculation (min) (ACUTE ONLY): 26 min  Charges:  $Gait Training: 8-22 mins $Therapeutic Activity: 8-22  mins                    G Codes:       Collie Siad PT, DPT 04/26/2017, 10:46 AM

## 2017-04-26 NOTE — Progress Notes (Signed)
Pt A&0X4. VSS. Pt walked to the restroom to void throughout the night, tolerated well.

## 2017-04-27 LAB — CBC WITH DIFFERENTIAL/PLATELET
Basophils Absolute: 0 10*3/uL (ref 0–0.1)
Basophils Relative: 0 %
Eosinophils Absolute: 0.1 10*3/uL (ref 0–0.7)
Eosinophils Relative: 1 %
HCT: 26.3 % — ABNORMAL LOW (ref 35.0–47.0)
Hemoglobin: 9 g/dL — ABNORMAL LOW (ref 12.0–16.0)
Lymphocytes Relative: 24 %
Lymphs Abs: 2 10*3/uL (ref 1.0–3.6)
MCH: 31.5 pg (ref 26.0–34.0)
MCHC: 34.4 g/dL (ref 32.0–36.0)
MCV: 91.6 fL (ref 80.0–100.0)
Monocytes Absolute: 0.7 10*3/uL (ref 0.2–0.9)
Monocytes Relative: 9 %
Neutro Abs: 5.4 10*3/uL (ref 1.4–6.5)
Neutrophils Relative %: 66 %
Platelets: 148 10*3/uL — ABNORMAL LOW (ref 150–440)
RBC: 2.87 MIL/uL — ABNORMAL LOW (ref 3.80–5.20)
RDW: 16 % — ABNORMAL HIGH (ref 11.5–14.5)
WBC: 8.2 10*3/uL (ref 3.6–11.0)

## 2017-04-27 LAB — GLUCOSE, CAPILLARY
Glucose-Capillary: 95 mg/dL (ref 65–99)
Glucose-Capillary: 95 mg/dL (ref 65–99)

## 2017-04-27 LAB — BPAM RBC
Blood Product Expiration Date: 201809192359
Blood Product Expiration Date: 201810042359
Blood Product Expiration Date: 201810052359
Blood Product Expiration Date: 201810142359
ISSUE DATE / TIME: 201809111030
ISSUE DATE / TIME: 201809112244
ISSUE DATE / TIME: 201809120948
Unit Type and Rh: 5100
Unit Type and Rh: 7300
Unit Type and Rh: 7300
Unit Type and Rh: 7300

## 2017-04-27 LAB — TYPE AND SCREEN
ABO/RH(D): B POS
Antibody Screen: NEGATIVE
Unit division: 0
Unit division: 0
Unit division: 0
Unit division: 0

## 2017-04-27 MED ORDER — METHOCARBAMOL 500 MG PO TABS: 1000.0000 mg | ORAL_TABLET | Freq: Three times a day (TID) | ORAL | 0 refills | Status: DC | PRN

## 2017-04-27 MED ORDER — OXYCODONE HCL 5 MG PO TABS: 5.0000 mg | ORAL_TABLET | ORAL | 0 refills | Status: DC | PRN

## 2017-04-27 NOTE — Discharge Summary (Signed)
History: Sherri Dickerson is POD3 s/p L3-Si fusion, L3/4, L4/5 TLIF. Patient continues to do well. Pain is controlled with oxycodone and robaxin. Patient states pain is absent approximately 1 hour after medication administration. During hospital course, she received two units of blood due to low pressure and blood loss with correlating labs. Drain has stabilized output.   Physical Exam:     Vitals:   04/27/17 0435 04/27/17 0717  BP: (!) 128/48 (!) 123/57  Pulse: 91 92  Resp:    Temp:  99 F (37.2 C)  SpO2:  97%    AA Ox3 CNI Strength:5/5 throughout, sensation intact  Data: CBC Labs (Brief)          Component Value Date/Time   WBC 8.2 04/27/2017 0449   RBC 2.87 (L) 04/27/2017 0449   HGB 9.0 (L) 04/27/2017 0449   HGB 12.7 08/28/2013 1023   HCT 26.3 (L) 04/27/2017 0449   HCT 37.4 08/28/2013 1023   PLT 148 (L) 04/27/2017 0449   PLT 217 08/28/2013 1023   MCV 91.6 04/27/2017 0449   MCV 89 08/28/2013 1023   MCH 31.5 04/27/2017 0449   MCHC 34.4 04/27/2017 0449   RDW 16.0 (H) 04/27/2017 0449   RDW 15.0 (H) 08/28/2013 1023   LYMPHSABS 2.0 04/27/2017 0449   MONOABS 0.7 04/27/2017 0449   EOSABS 0.1 04/27/2017 0449   BASOSABS 0.0 04/27/2017 0449         Assessment/Plan:  Sherri Dickerson is POD3 s/p L3-Si fusion, L3/4, L4/5 TLIF. Overall, she continues to do well. Pain is controlled with oxycodone and robaxin. Per Dr. Lacinda Axon, drain was removed. Patient is eating, ambulating, and voiding without issue. PT evaluation was positive. Patient will follow up in 2 weeks at the clinic to monitor progress. Will continue pain management of oxycodone and robaxin.   Marin Olp PA-C Department of Neurosurgery

## 2017-04-27 NOTE — Discharge Instructions (Signed)

## 2017-04-27 NOTE — Progress Notes (Signed)
  History: Sherri Dickerson is POD3 s/p L3-Si fusion, L3/4, L4/5 TLIF. Patient is doing well. Pain is currently 6 or 7/10 but she had just received pain medication. Complains of headache and increased sensation in right lower extremity.  Physical Exam: Vitals:   04/27/17 0435 04/27/17 0717  BP: (!) 128/48 (!) 123/57  Pulse: 91 92  Resp:    Temp:  99 F (37.2 C)  SpO2:  97%    AA Ox3 CNI Strength:5/5 throughout, sensation intact  Data: CBC    Component Value Date/Time   WBC 8.2 04/27/2017 0449   RBC 2.87 (L) 04/27/2017 0449   HGB 9.0 (L) 04/27/2017 0449   HGB 12.7 08/28/2013 1023   HCT 26.3 (L) 04/27/2017 0449   HCT 37.4 08/28/2013 1023   PLT 148 (L) 04/27/2017 0449   PLT 217 08/28/2013 1023   MCV 91.6 04/27/2017 0449   MCV 89 08/28/2013 1023   MCH 31.5 04/27/2017 0449   MCHC 34.4 04/27/2017 0449   RDW 16.0 (H) 04/27/2017 0449   RDW 15.0 (H) 08/28/2013 1023   LYMPHSABS 2.0 04/27/2017 0449   MONOABS 0.7 04/27/2017 0449   EOSABS 0.1 04/27/2017 0449   BASOSABS 0.0 04/27/2017 0449       Assessment/Plan:  Sherri Dickerson is POD3 s/p L3-Si fusion, L3/4, L4/5 TLIF. Overall, she continues to do well. Pain is controlled with oxycodone and robaxin. Patient has improved since receiving 1 unit of blood yesterday. Drain output seems to be stabilizing with overnight output of 135ml. Hematocrit also increasing.  Continue pain control, PT, OT, and pain management. Patient voiding and ambulating well.  Will refer to Dr. Lacinda Axon to determine if drain can be removed today.   Marin Olp PA-C Department of Neurosurgery

## 2017-04-27 NOTE — Progress Notes (Signed)
Physical Therapy Treatment Patient Details Name: Sherri Dickerson MRN: [id number] DOB: 1961-04-21 Today's Date: 04/27/2017    History of Present Illness Pt is a 56 y/o F who presented with failed back surgery with diagnosis of lumbar stenosis.  She is now s/p L3-S1 fusion, L3/4, L4/5 TLIF.  Pt's PMH includes Foraminotomy and lumbar laminectomy/decompression in 2017, cancer.    PT Comments    Sherri Dickerson continues to make good progress with mobility.  She is able to perform transfers and ambulation x80 ft with supervision and cues for safety.  She demonstrated proper log roll technique for sit>supine without cues or physical assist.  Encouraged pt to continue with mobility as documented below.    Follow Up Recommendations  No PT follow up;Supervision for mobility/OOB     Equipment Recommendations  Rolling walker with 5" wheels    Recommendations for Other Services       Precautions / Restrictions Precautions Precautions: Fall;Back Precaution Booklet Issued: Yes (comment) Precaution Comments: Pt able to recall back precautions without assist or cues Required Braces or Orthoses:  (Back brace for comfort, not required) Restrictions Weight Bearing Restrictions: No    Mobility  Bed Mobility Overal bed mobility: Needs Assistance Bed Mobility: Rolling;Sit to Sidelying Rolling: Supervision       Sit to sidelying: Supervision General bed mobility comments: HOB flat and no use of rail to simulate home environment.  No cues or physical assist required.  Supervision provided for safety.   Transfers Overall transfer level: Needs assistance Equipment used: Rolling walker (2 wheeled) Transfers: Sit to/from Stand Sit to Stand: Supervision         General transfer comment: Supervision for safety.  Pt performs correct technique with sit>stand but requires cues for proper hand placement with stand>sit.  Pt steady with both sit<>stand.   Ambulation/Gait Ambulation/Gait assistance:  Supervision Ambulation Distance (Feet): 80 Feet Assistive device: Rolling walker (2 wheeled) Gait Pattern/deviations: Step-through pattern;Decreased stride length;Decreased dorsiflexion - right;Decreased dorsiflexion - left;Antalgic Gait velocity: decreased Gait velocity interpretation: Below normal speed for age/gender General Gait Details: Cues for Bil heel strike and forward gaze which pt is able to demonstrate back to this therapist.  Pt demonstrates steady gait and supervision provided for safety.    Stairs            Wheelchair Mobility    Modified Rankin (Stroke Patients Only)       Balance Overall balance assessment: Needs assistance Sitting-balance support: No upper extremity supported;Feet supported Sitting balance-Leahy Scale: Good     Standing balance support: No upper extremity supported;During functional activity Standing balance-Leahy Scale: Fair Standing balance comment: Pt able to stand statically without UE support but requires UE support for dynamic activities or any perturbation                            Cognition Arousal/Alertness: Awake/alert Behavior During Therapy: WFL for tasks assessed/performed Overall Cognitive Status: Within Functional Limits for tasks assessed                                        Exercises General Exercises - Lower Extremity Ankle Circles/Pumps: AROM;Both;10 reps;Seated Long Arc Quad: AROM;Both;10 reps;Seated Other Exercises Other Exercises: Pt recalls that when she returns home she needs to walk inside home at least every 45 minutes to prevent stiffness and increased pain.  General Comments        Pertinent Vitals/Pain Pain Assessment: 0-10 Pain Score: 8  Pain Location: RLE>low back Pain Descriptors / Indicators: Aching;Discomfort;Grimacing;Guarding;Moaning Pain Intervention(s): Limited activity within patient's tolerance;Monitored during session;Repositioned;Utilized relaxation  techniques    Home Living                      Prior Function            PT Goals (current goals can now be found in the care plan section) Acute Rehab PT Goals Patient Stated Goal: to go home PT Goal Formulation: With patient Time For Goal Achievement: 05/09/17 Potential to Achieve Goals: Good Progress towards PT goals: Progressing toward goals    Frequency    7X/week      PT Plan Current plan remains appropriate    Co-evaluation              AM-PAC PT "6 Clicks" Daily Activity  Outcome Measure  Difficulty turning over in bed (including adjusting bedclothes, sheets and blankets)?: A Little Difficulty moving from lying on back to sitting on the side of the bed? : A Little Difficulty sitting down on and standing up from a chair with arms (e.g., wheelchair, bedside commode, etc,.)?: A Little Help needed moving to and from a bed to chair (including a wheelchair)?: A Little Help needed walking in hospital room?: A Little Help needed climbing 3-5 steps with a railing? : A Little 6 Click Score: 18    End of Session Equipment Utilized During Treatment: Gait belt Activity Tolerance: Patient tolerated treatment well;Patient limited by pain Patient left: with call bell/phone within reach;in bed;with bed alarm set;with SCD's reapplied Nurse Communication: Mobility status PT Visit Diagnosis: Pain;Other abnormalities of gait and mobility (R26.89);Unsteadiness on feet (R26.81) Pain - Right/Left:  (middle) Pain - part of body:  (Back)     Time: 6767-2094 PT Time Calculation (min) (ACUTE ONLY): 17 min  Charges:  $Gait Training: 8-22 mins                    G Codes:       Sherri Dickerson PT, DPT 04/27/2017, 1:03 PM

## 2017-04-27 NOTE — Progress Notes (Signed)
Patient was given discharge instructions and verbalized understanding. Patient has no SS of distress.

## 2017-05-03 ENCOUNTER — Encounter: Payer: Self-pay | Admitting: Neurosurgery

## 2017-10-30 ENCOUNTER — Other Ambulatory Visit: Payer: Self-pay | Admitting: Family Medicine

## 2017-10-30 DIAGNOSIS — Z1231 Encounter for screening mammogram for malignant neoplasm of breast: Secondary | ICD-10-CM

## 2017-11-15 ENCOUNTER — Ambulatory Visit
Admission: RE | Admit: 2017-11-15 | Discharge: 2017-11-15 | Disposition: A | Discharge status: Home / Self Care | Payer: BLUE CROSS/BLUE SHIELD | Source: Ambulatory Visit | Attending: Family Medicine | Admitting: Family Medicine

## 2017-11-15 DIAGNOSIS — Z1231 Encounter for screening mammogram for malignant neoplasm of breast: Secondary | ICD-10-CM | POA: Insufficient documentation

## 2019-01-28 ENCOUNTER — Other Ambulatory Visit: Payer: Self-pay | Admitting: Family Medicine

## 2019-01-28 DIAGNOSIS — Z1231 Encounter for screening mammogram for malignant neoplasm of breast: Secondary | ICD-10-CM

## 2019-01-31 ENCOUNTER — Ambulatory Visit
Admission: RE | Admit: 2019-01-31 | Discharge: 2019-01-31 | Disposition: A | Discharge status: Home / Self Care | Payer: BC Managed Care – PPO | Source: Ambulatory Visit | Attending: Family Medicine | Admitting: Family Medicine

## 2019-01-31 ENCOUNTER — Other Ambulatory Visit: Payer: Self-pay

## 2019-01-31 DIAGNOSIS — Z1231 Encounter for screening mammogram for malignant neoplasm of breast: Secondary | ICD-10-CM | POA: Insufficient documentation

## 2019-01-31 HISTORY — DX: Personal history of irradiation: Z92.3

## 2020-02-18 ENCOUNTER — Other Ambulatory Visit: Payer: Self-pay | Admitting: Family Medicine

## 2020-02-18 DIAGNOSIS — Z1231 Encounter for screening mammogram for malignant neoplasm of breast: Secondary | ICD-10-CM

## 2020-02-19 ENCOUNTER — Ambulatory Visit
Admission: RE | Admit: 2020-02-19 | Discharge: 2020-02-19 | Disposition: A | Discharge status: Home / Self Care | Payer: BC Managed Care – PPO | Source: Ambulatory Visit | Attending: Family Medicine | Admitting: Family Medicine

## 2020-02-19 DIAGNOSIS — Z1231 Encounter for screening mammogram for malignant neoplasm of breast: Secondary | ICD-10-CM | POA: Insufficient documentation

## 2020-04-14 ENCOUNTER — Other Ambulatory Visit: Payer: Self-pay | Admitting: Nurse Practitioner

## 2020-04-14 DIAGNOSIS — G8929 Other chronic pain: Secondary | ICD-10-CM

## 2020-04-14 DIAGNOSIS — M5416 Radiculopathy, lumbar region: Secondary | ICD-10-CM

## 2020-05-11 ENCOUNTER — Other Ambulatory Visit: Payer: Self-pay | Admitting: Family Medicine

## 2020-05-11 DIAGNOSIS — R1011 Right upper quadrant pain: Secondary | ICD-10-CM

## 2020-05-11 DIAGNOSIS — Z0000 Encounter for general adult medical examination without abnormal findings: Secondary | ICD-10-CM

## 2020-05-15 ENCOUNTER — Other Ambulatory Visit: Payer: Self-pay | Admitting: Nurse Practitioner

## 2020-05-15 DIAGNOSIS — G8929 Other chronic pain: Secondary | ICD-10-CM

## 2020-05-15 DIAGNOSIS — M5416 Radiculopathy, lumbar region: Secondary | ICD-10-CM

## 2020-05-27 ENCOUNTER — Other Ambulatory Visit: Payer: Self-pay

## 2020-05-27 ENCOUNTER — Ambulatory Visit
Admission: RE | Admit: 2020-05-27 | Discharge: 2020-05-27 | Disposition: A | Discharge status: Home / Self Care | Payer: BC Managed Care – PPO | Source: Ambulatory Visit | Attending: Nurse Practitioner | Admitting: Nurse Practitioner

## 2020-05-27 DIAGNOSIS — M5416 Radiculopathy, lumbar region: Secondary | ICD-10-CM

## 2020-05-27 DIAGNOSIS — G8929 Other chronic pain: Secondary | ICD-10-CM

## 2020-05-27 DIAGNOSIS — M5441 Lumbago with sciatica, right side: Secondary | ICD-10-CM | POA: Diagnosis present

## 2020-06-03 ENCOUNTER — Ambulatory Visit
Admission: RE | Admit: 2020-06-03 | Discharge: 2020-06-03 | Disposition: A | Discharge status: Home / Self Care | Payer: BC Managed Care – PPO | Source: Ambulatory Visit | Attending: Nurse Practitioner | Admitting: Nurse Practitioner

## 2020-06-03 ENCOUNTER — Other Ambulatory Visit: Payer: Self-pay

## 2020-06-03 DIAGNOSIS — G8929 Other chronic pain: Secondary | ICD-10-CM | POA: Diagnosis present

## 2020-06-03 DIAGNOSIS — M5416 Radiculopathy, lumbar region: Secondary | ICD-10-CM | POA: Diagnosis present

## 2020-06-03 DIAGNOSIS — M5441 Lumbago with sciatica, right side: Secondary | ICD-10-CM | POA: Insufficient documentation

## 2021-01-19 ENCOUNTER — Other Ambulatory Visit: Payer: Self-pay | Admitting: Family Medicine

## 2021-01-19 DIAGNOSIS — Z1231 Encounter for screening mammogram for malignant neoplasm of breast: Secondary | ICD-10-CM

## 2021-04-07 ENCOUNTER — Ambulatory Visit
Admission: RE | Admit: 2021-04-07 | Discharge: 2021-04-07 | Disposition: A | Discharge status: Home / Self Care | Payer: BC Managed Care – PPO | Source: Ambulatory Visit | Attending: Family Medicine | Admitting: Family Medicine

## 2021-04-07 ENCOUNTER — Other Ambulatory Visit: Payer: Self-pay

## 2021-04-07 DIAGNOSIS — Z1231 Encounter for screening mammogram for malignant neoplasm of breast: Secondary | ICD-10-CM | POA: Insufficient documentation

## 2021-04-30 ENCOUNTER — Other Ambulatory Visit: Payer: Self-pay | Admitting: Family Medicine

## 2021-04-30 DIAGNOSIS — R1011 Right upper quadrant pain: Secondary | ICD-10-CM

## 2021-05-11 ENCOUNTER — Ambulatory Visit
Admission: RE | Admit: 2021-05-11 | Discharge: 2021-05-11 | Disposition: A | Discharge status: Home / Self Care | Payer: BC Managed Care – PPO | Source: Ambulatory Visit | Attending: Family Medicine | Admitting: Family Medicine

## 2021-05-11 ENCOUNTER — Other Ambulatory Visit: Payer: Self-pay

## 2021-05-11 DIAGNOSIS — R1011 Right upper quadrant pain: Secondary | ICD-10-CM | POA: Diagnosis present

## 2021-06-10 ENCOUNTER — Other Ambulatory Visit: Payer: Self-pay

## 2021-06-10 ENCOUNTER — Ambulatory Visit: Payer: Self-pay

## 2021-06-10 ENCOUNTER — Encounter: Payer: Self-pay | Admitting: Emergency Medicine

## 2021-06-10 ENCOUNTER — Ambulatory Visit
Admission: EM | Admit: 2021-06-10 | Discharge: 2021-06-10 | Disposition: A | Discharge status: Home / Self Care | Payer: BC Managed Care – PPO | Attending: Emergency Medicine | Admitting: Emergency Medicine

## 2021-06-10 DIAGNOSIS — Z1152 Encounter for screening for COVID-19: Secondary | ICD-10-CM

## 2021-06-10 DIAGNOSIS — J069 Acute upper respiratory infection, unspecified: Secondary | ICD-10-CM | POA: Diagnosis not present

## 2021-06-10 DIAGNOSIS — J029 Acute pharyngitis, unspecified: Secondary | ICD-10-CM

## 2021-06-10 LAB — POCT RAPID STREP A (OFFICE): Rapid Strep A Screen: NEGATIVE

## 2021-06-10 NOTE — ED Provider Notes (Signed)
Sherri Dickerson    CSN: [id number] Arrival date & time: 06/10/21  1217      History   Chief Complaint Chief Complaint  Patient presents with   Sore Throat   Cough   Otalgia    HPI Sherri Dickerson is a 60 y.o. female.  Patient presents with earache, sore throat, congestion, cough x2 days.  Treatment at home with Tylenol.  She denies fever, rash, shortness of breath, vomiting, diarrhea, or other symptoms.  Her medical history includes thyroid cancer, diabetes, hypertension.  The history is provided by the patient and medical records.   Past Medical History:  Diagnosis Date   Anxiety    Arthritis    osteo generalized   Cancer ([place]) 2012?   thyroid   Diabetes mellitus without complication ([place])    Gastritis    Headache    migraines   Hyperlipidemia    Hypertension    Hypothyroidism    Personal history of radiation therapy    Sleep apnea    Status post radiation therapy    thyroid    Patient Active Problem List   Diagnosis Date Noted   Spinal stenosis 04/24/2017   Rectal pain 09/18/2015   Gluteal pain 09/18/2015    Past Surgical History:  Procedure Laterality Date   ABDOMINAL HYSTERECTOMY  2005   COLONOSCOPY  2013?   Dr Donnella Sham   FORAMINOTOMY 2 LEVEL  07/25/2016   Procedure: FORAMINOTOMY 2 LEVEL;  Surgeon: Deetta Perla, MD;  Location: [facility];  Service: Neurosurgery;;  L4-5, L3-4   LUMBAR LAMINECTOMY/DECOMPRESSION MICRODISCECTOMY Left 07/25/2016   Procedure: LUMBAR LAMINECTOMY/DECOMPRESSION MICRODISCECTOMY 2 LEVELS;  Surgeon: Deetta Perla, MD;  Location: [facility];  Service: Neurosurgery;  Laterality: Left;  L3-4 hemilaminectomy and  L4-5 full laminectomy   OOPHORECTOMY     THYROIDECTOMY  2012?   Dr Kathyrn Sheriff   TONSILLECTOMY     TRANSFORAMINAL LUMBAR INTERBODY FUSION (TLIF) WITH PEDICLE SCREW FIXATION 3 LEVEL Bilateral 04/24/2017   Procedure: TRANSFORAMINAL LUMBAR INTERBODY FUSION (TLIF) WITH PEDICLE SCREW FIXATION 3 LEVEL-L3-S1;  Surgeon: Deetta Perla, MD;   Location: [facility];  Service: Neurosurgery;  Laterality: Bilateral;    OB History     Gravida  2   Para      Term      Preterm      AB      Living  2      SAB      IAB      Ectopic      Multiple      Live Births               Home Medications    Prior to Admission medications   Medication Sig Start Date End Date Taking? Authorizing Provider  atorvastatin (LIPITOR) 40 MG tablet TK 1 T PO QPM 08/26/15   [provider]  Blood Glucose Monitoring Suppl (FIFTY50 GLUCOSE METER 2.0) w/Device KIT Check fasting sugar once daily.  E11.9 01/20/15   [provider]  citalopram (CELEXA) 10 MG tablet Take 10 mg by mouth daily.    [provider]  Cyanocobalamin (RA VITAMIN B-12 TR) 1000 MCG TBCR Take by mouth every morning.     [provider]  diazepam (VALIUM) 5 MG tablet Take by mouth. 02/20/14   [provider]  gabapentin (NEURONTIN) 300 MG capsule TK ONE C PO  QAM AND 2 CS Q NIGHT and one at lunch. 08/26/15   [provider]  lisinopril-hydrochlorothiazide (PRINZIDE,ZESTORETIC) 10-12.5 MG  tablet TK 1 T PO QD 08/17/15   [provider]  metFORMIN (GLUCOPHAGE) 500 MG tablet 500 mg daily with supper.  08/26/15   [provider]  methocarbamol (ROBAXIN) 500 MG tablet Take 2 tablets (1,000 mg total) by mouth every 8 (eight) hours as needed for muscle spasms. 04/27/17   Marin Olp, PA-C  Multiple Vitamin (MULTI-VITAMINS) TABS Take 1 tablet by mouth daily.     [provider]  naproxen (NAPROSYN) 500 MG tablet 1 po bid with food and water when needed.  Do not take daily. 07/21/15   [provider]  oxyCODONE (OXY IR/ROXICODONE) 5 MG immediate release tablet Take 1-3 tablets (5-15 mg total) by mouth every 4 (four) hours as needed for moderate pain (5 mg for pain <6, 10 mg for 7-8/10, 15 mg for 9-10/10). 04/27/17   Marin Olp, PA-C  RABEprazole (ACIPHEX) 20 MG tablet 20 mg daily.  08/17/15   [provider]  SYNTHROID 125 MCG tablet daily before breakfast.  08/20/15   [provider]  topiramate (TOPAMAX) 100 MG tablet Take 100 mg by mouth daily. Evening.    [provider]  Venlafaxine HCl 75 MG TB24 150 mg daily with breakfast.  09/08/15   [provider]  Vitamin D, Ergocalciferol, 2000 units CAPS Take by mouth. 05/30/14   [provider]    Family History Family History  Problem Relation Age of Onset   Diabetes Mother    Dementia Father    Breast cancer Neg Hx     Social History Social History   Tobacco Use   Smoking status: Light Smoker    Packs/day: 0.25    Types: Cigarettes   Smokeless tobacco: Never   Tobacco comments:    2-3 cig per day.  Vaping Use   Vaping Use: Never used  Substance Use Topics   Alcohol use: Yes    Alcohol/week: 7.0 standard drinks    Types: 7 Cans of beer per week   Drug use: No     Allergies   Macrobid [nitrofurantoin], Morphine and related, and Wellbutrin [bupropion]   Review of Systems Review of Systems  Constitutional:  Negative for chills and fever.  HENT:  Positive for congestion, ear pain and sore throat.   Respiratory:  Positive for cough. Negative for shortness of breath.   Cardiovascular:  Negative for chest pain and palpitations.  Gastrointestinal:  Negative for abdominal pain, diarrhea and vomiting.  Skin:  Negative for color change and rash.  All other systems reviewed and are negative.   Physical Exam Triage Vital Signs ED Triage Vitals  Enc Vitals Group     BP      Pulse      Resp      Temp      Temp src      SpO2      Weight      Height      Head Circumference      Peak Flow      Pain Score      Pain Loc      Pain Edu?      Excl. in [place]?    No data found.  Updated Vital Signs BP 135/84 (BP Location: Left Arm)   Pulse 78   Temp 98.5 F (36.9 C) (Oral)   Resp 16   Ht $R'5\' 6"'aU$  (1.676 m)   Wt 240 lb (108.9 kg)   SpO2 97%   BMI 38.74 kg/m   Visual  Acuity Right Eye Distance:   Left Eye Distance:   Bilateral Distance:    Right Eye Near:   Left Eye Near:    Bilateral Near:     Physical Exam Vitals and nursing note reviewed.  Constitutional:      General: She is not in acute distress.    Appearance: She is well-developed. She is obese.  HENT:     Head: Normocephalic and atraumatic.     Right Ear: Tympanic membrane normal.     Left Ear: Tympanic membrane normal.     Nose: Rhinorrhea present.     Mouth/Throat:     Mouth: Mucous membranes are moist.     Pharynx: Oropharynx is clear.  Eyes:     Conjunctiva/sclera: Conjunctivae normal.  Cardiovascular:     Rate and Rhythm: Normal rate and regular rhythm.     Heart sounds: Normal heart sounds.  Pulmonary:     Effort: Pulmonary effort is normal. No respiratory distress.     Breath sounds: Normal breath sounds.  Abdominal:     Palpations: Abdomen is soft.     Tenderness: There is no abdominal tenderness.  Musculoskeletal:     Cervical back: Neck supple.  Skin:    General: Skin is warm and dry.  Neurological:     General: No focal deficit present.     Mental Status: She is alert and oriented to person, place, and time.  Psychiatric:        Mood and Affect: Mood normal.        Behavior: Behavior normal.     UC Treatments / Results  Labs (all labs ordered are listed, but only abnormal results are displayed) Labs Reviewed  NOVEL CORONAVIRUS, NAA  POCT RAPID STREP A (OFFICE)    EKG   Radiology No results found.  Procedures Procedures (including critical care time)  Medications Ordered in UC Medications - No data to display  Initial Impression / Assessment and Plan / UC Course  I have reviewed the triage vital signs and the nursing notes.  Pertinent labs & imaging results that were available during my care of the patient were reviewed by me and considered in my medical decision making (see chart for details).   Viral URI, sore throat.  Rapid strep negative.   COVID pending.  Instructed patient to self quarantine per CDC guidelines.  Discussed symptomatic treatment including Tylenol or ibuprofen, rest, hydration.  Instructed patient to follow up with PCP if symptoms are not improving.  Patient agrees to plan of care.    Final Clinical Impressions(s) / UC Diagnoses   Final diagnoses:  Viral URI  Sore throat     Discharge Instructions      Your strep test is negative.    Your COVID test is pending.  You should self quarantine until the test result is back.    Take Tylenol or ibuprofen as needed for fever or discomfort.  Rest and keep yourself hydrated.    Follow-up with your primary care provider if your symptoms are not improving.         ED Prescriptions   None    PDMP not reviewed this encounter.   Sharion Balloon, NP 06/10/21 1251

## 2021-06-10 NOTE — Discharge Instructions (Addendum)
Your strep test is negative.    Your COVID test is pending.  You should self quarantine until the test result is back.    Take Tylenol or ibuprofen as needed for fever or discomfort.  Rest and keep yourself hydrated.    Follow-up with your primary care provider if your symptoms are not improving.

## 2021-06-10 NOTE — ED Triage Notes (Signed)
Patient in office today c/o sorethroat, ear pain(right), nasal congestion x 2 days ago. Some dizziness due to nasal congestion  Denies:Fever, N/V, HA OTC: tylenol

## 2021-06-11 LAB — SARS-COV-2, NAA 2 DAY TAT

## 2021-06-11 LAB — NOVEL CORONAVIRUS, NAA: SARS-CoV-2, NAA: NOT DETECTED

## 2022-01-08 IMAGING — MG MM DIGITAL SCREENING BILAT W/ TOMO AND CAD
6 of 10 series · 6 of 30 positions shown · non-contrast
Comparison: Previous exam(s).

CLINICAL DATA: Screening.

EXAM:
DIGITAL SCREENING BILATERAL MAMMOGRAM WITH TOMOSYNTHESIS AND CAD
TECHNIQUE: Bilateral screening digital craniocaudal and mediolateral oblique
mammograms were obtained. Bilateral screening digital breast
tomosynthesis was performed. The images were evaluated with
computer-aided detection.

[R CC synth-2D (1 of 2)]
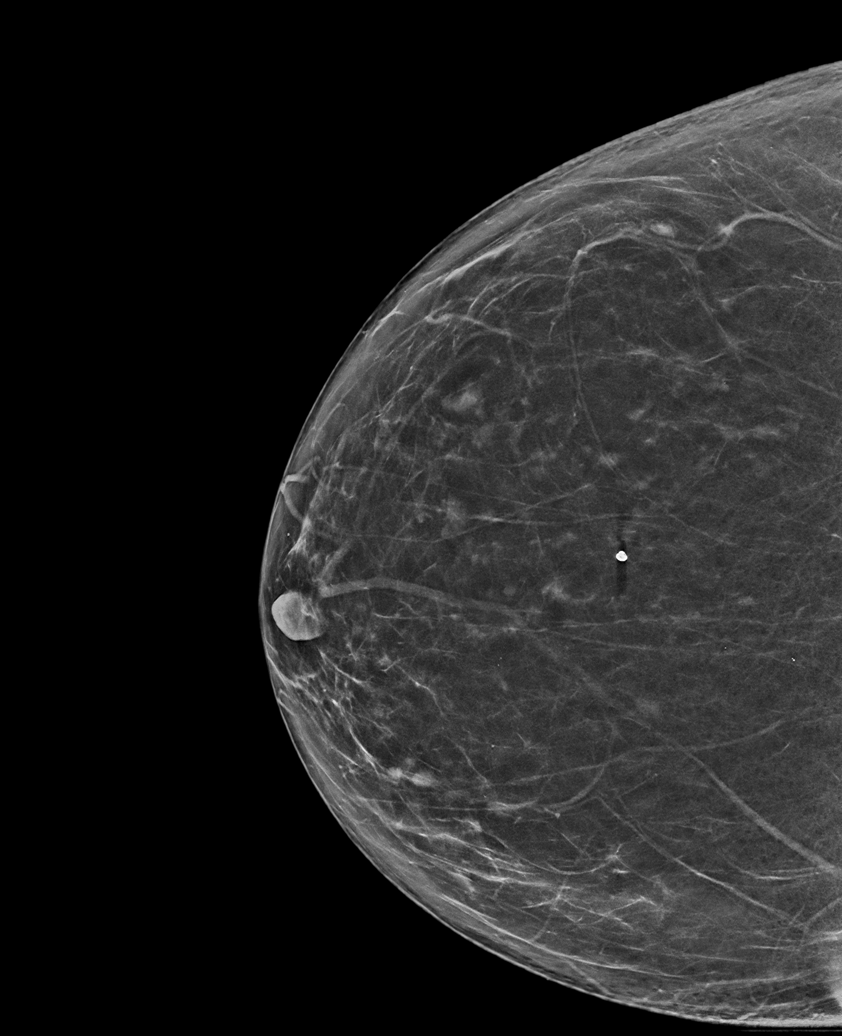

[L MLO synth-2D]
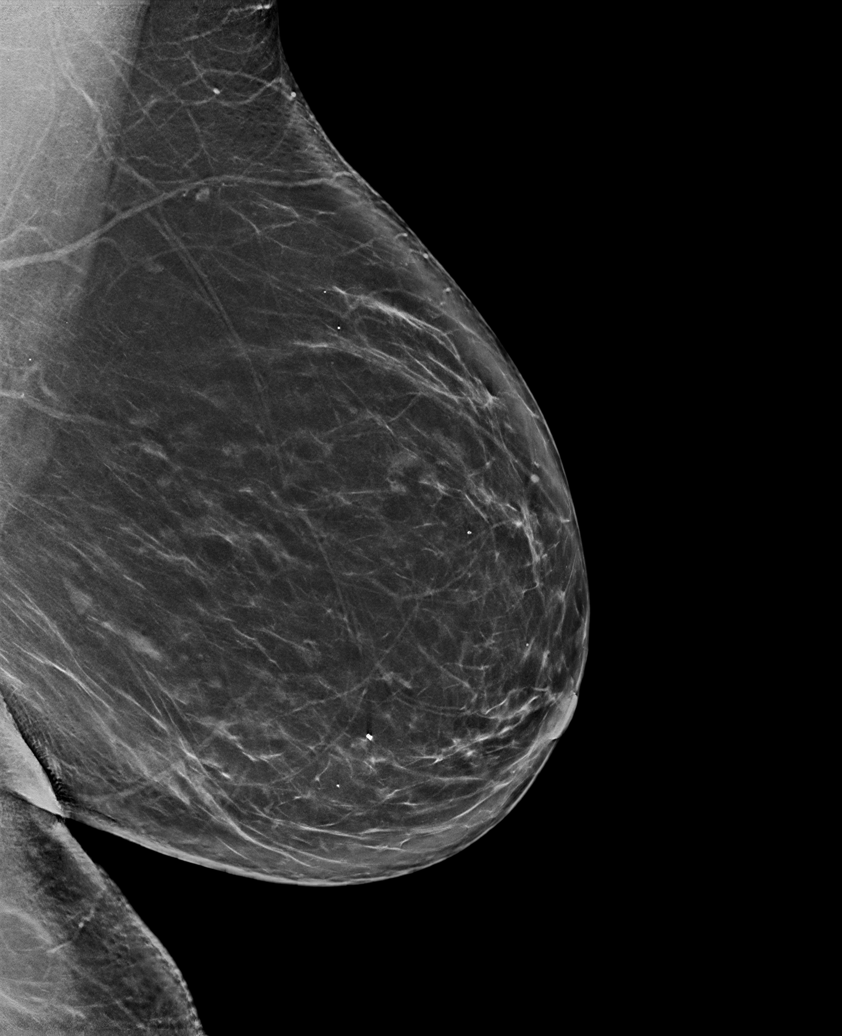

[R CC synth-2D (2 of 2)]
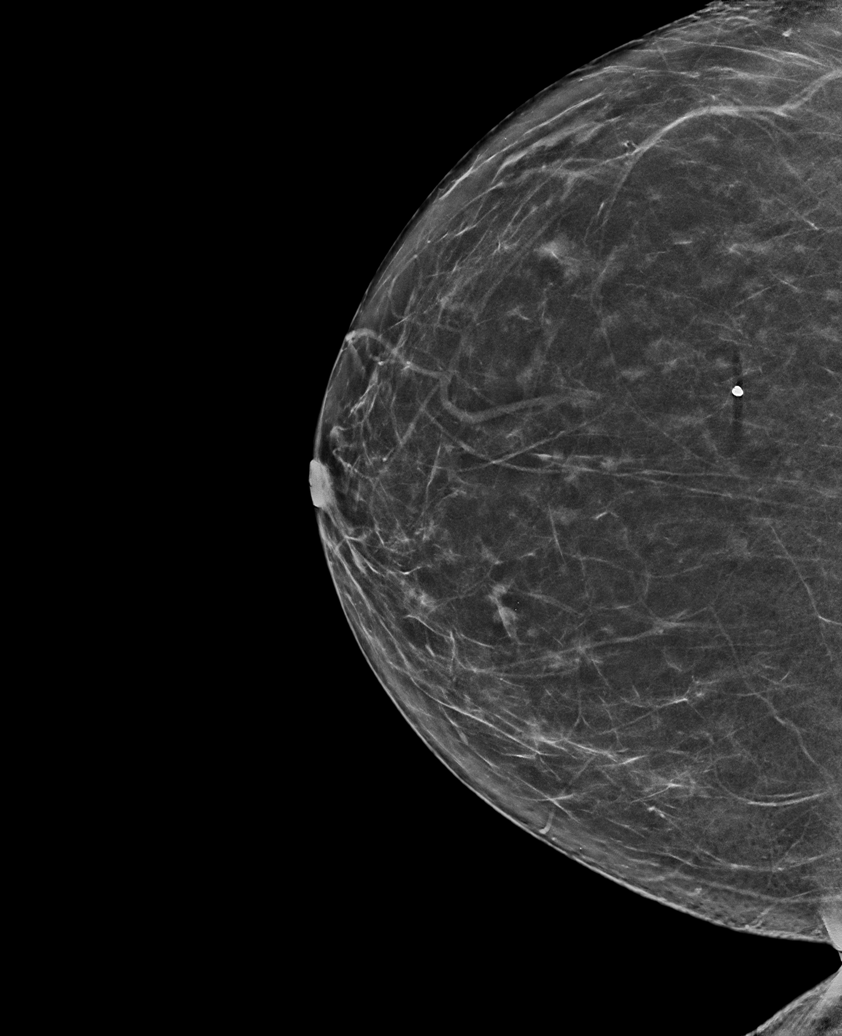

[L CC synth-2D]
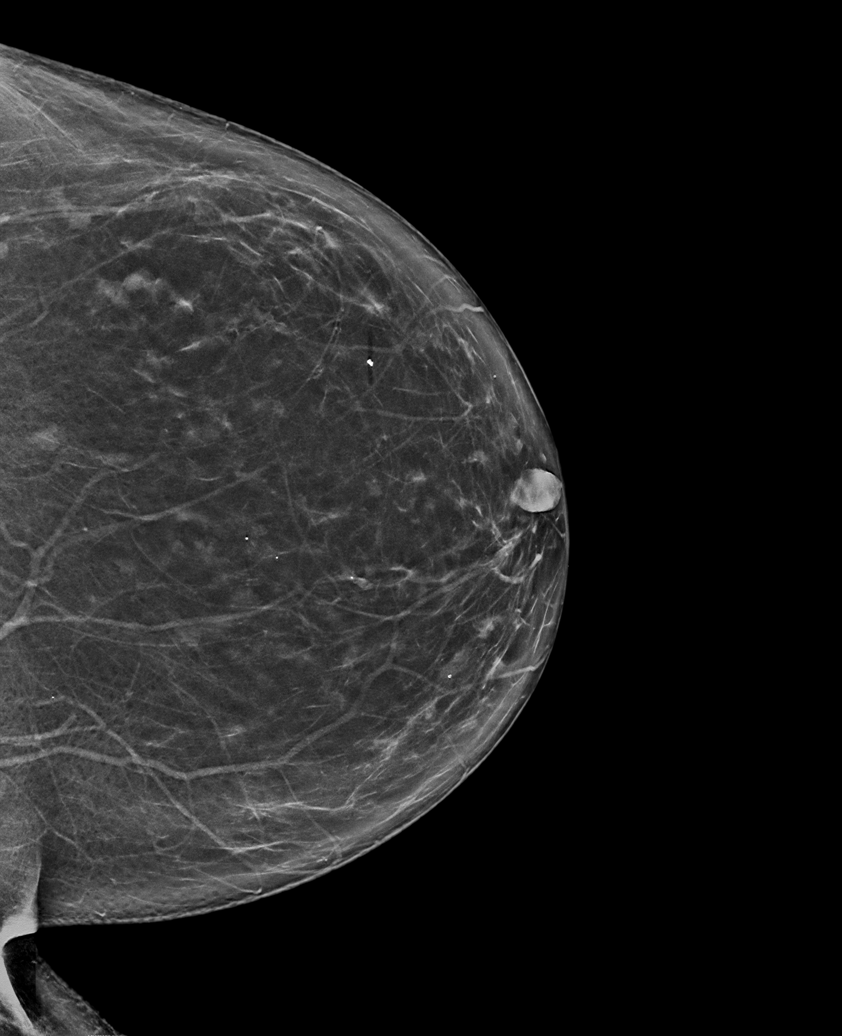

[R MLO synth-2D]
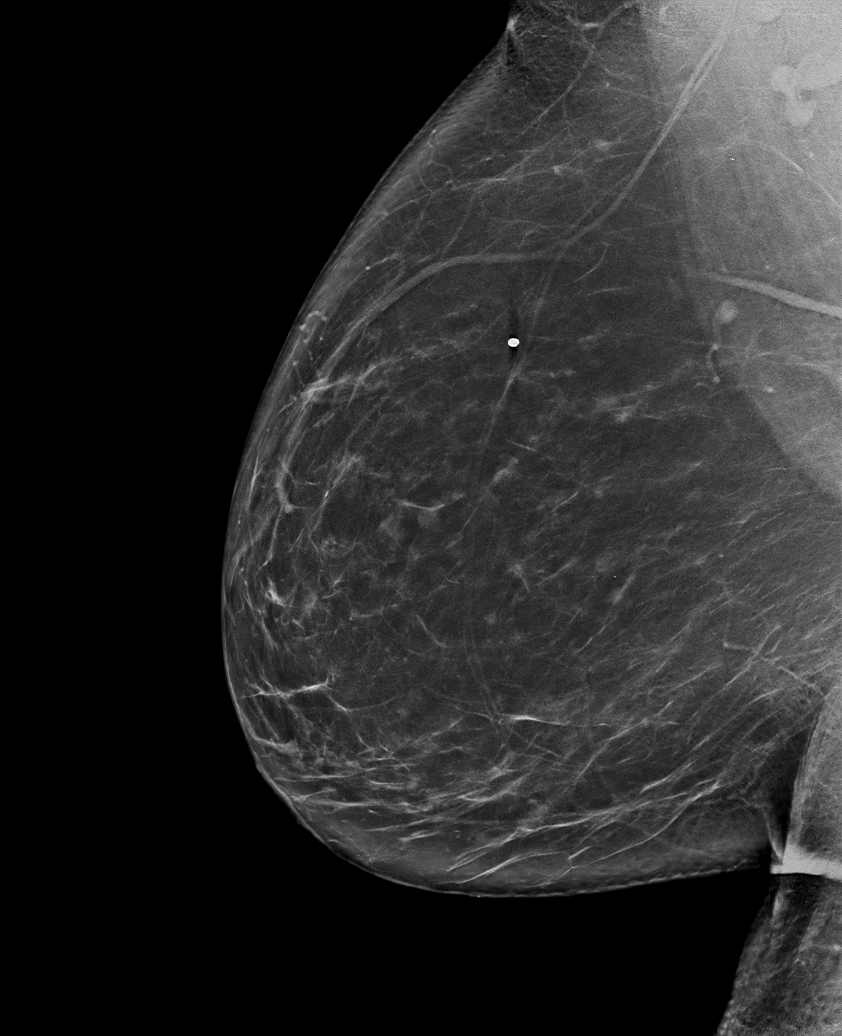

[L MLO tomo · tomo slice 45/89.0]
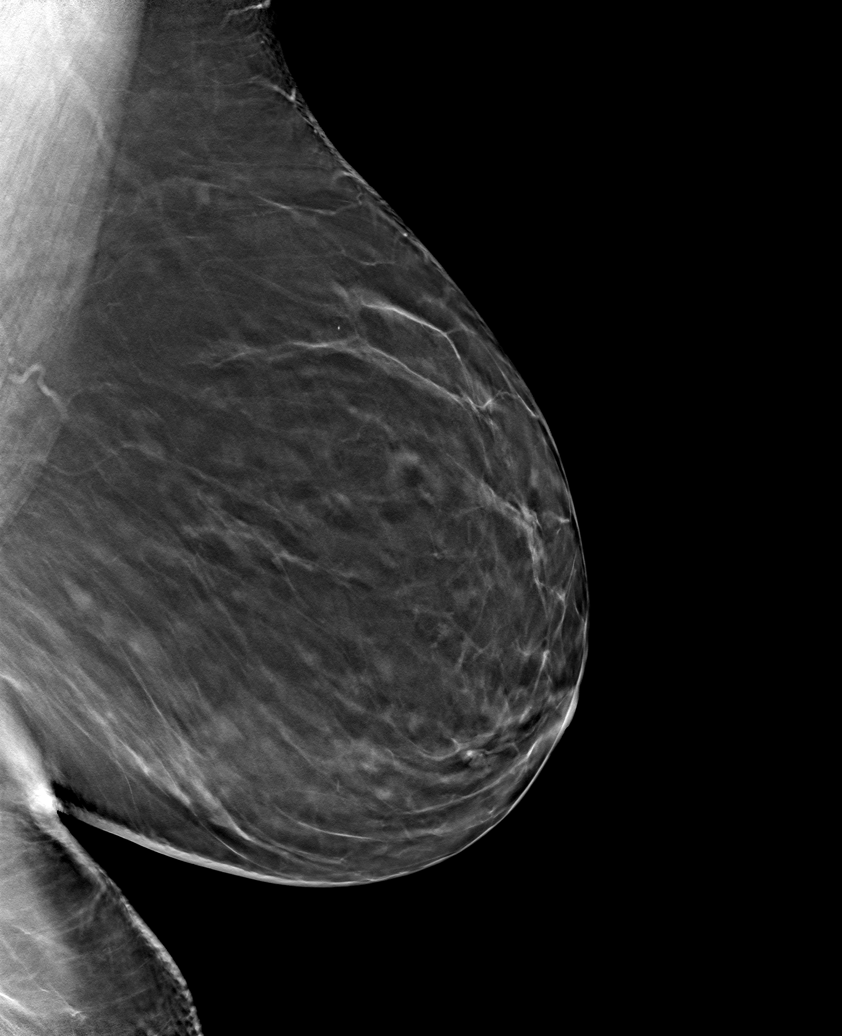

[6 of 30 positions shown; findings below may reference images not displayed]

ACR Breast Density Category b: There are scattered areas of
fibroglandular density.
FINDINGS: There are no findings suspicious for malignancy.
IMPRESSION: No mammographic evidence of malignancy. A result letter of this
screening mammogram will be mailed directly to the patient.

RECOMMENDATION:
Screening mammogram in one year. (Code:51-O-LD2)

BI-RADS CATEGORY  1: Negative.

## 2022-03-20 ENCOUNTER — Encounter: Payer: Self-pay | Admitting: Gastroenterology

## 2022-03-20 NOTE — H&P (Signed)
Pre-Procedure H&P   Patient ID: Sherri Dickerson is a 61 y.o. female.  Gastroenterology Provider: Annamaria Helling, DO  Referring Provider: Laurine Blazer, PA PCP: Donnamarie Rossetti, PA-C  Date: 03/21/2022  HPI [name] is a 61 y.o. female who presents today for Esophagogastroduodenoscopy and Colonoscopy for Acid reflux; screening colonoscopy.  Patient with acid reflux and right upper quadrant discomfort.  No nausea vomiting dysphagia or odynophagia.  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  She is also status post hysterectomy.  Takes Pepcid and Aciphex which controls her reflux  Due for screening colonoscopy.  Last performed in October 2012 noting redundancy and restricted mobility but no polyps.  Bowel movements have been normal without melena hematochezia diarrhea or constipation.  Patient maintains regularity 2 bowel movements per day as long as she takes her MiraLAX.  Active tobacco use.  No family history colon cancer or colon polyps  AST 57 total bili 0.3 ALT 46 hemoglobin 13.6 MCV 91 platelets 175,000 A1c 7  EGD in September 2010   Past Medical History:  Diagnosis Date   Anxiety    Arthritis    osteo generalized   Cancer ([place]) 2012?   thyroid   Diabetes mellitus without complication ([place])    Gastritis    Headache    migraines   Hyperlipidemia    Hypertension    Hypothyroidism    Personal history of radiation therapy    Sleep apnea    Status post radiation therapy    thyroid    Past Surgical History:  Procedure Laterality Date   ABDOMINAL HYSTERECTOMY  2005   COLONOSCOPY  2013?   Dr Donnella Sham   FORAMINOTOMY 2 LEVEL  07/25/2016   Procedure: FORAMINOTOMY 2 LEVEL;  Surgeon: Deetta Perla, MD;  Location: [facility];  Service: Neurosurgery;;  L4-5, L3-4   LUMBAR LAMINECTOMY/DECOMPRESSION MICRODISCECTOMY Left 07/25/2016   Procedure: LUMBAR  LAMINECTOMY/DECOMPRESSION MICRODISCECTOMY 2 LEVELS;  Surgeon: Deetta Perla, MD;  Location: [facility];  Service: Neurosurgery;  Laterality: Left;  L3-4 hemilaminectomy and  L4-5 full laminectomy   OOPHORECTOMY     THYROIDECTOMY  2012?   Dr Kathyrn Sheriff   TONSILLECTOMY     TRANSFORAMINAL LUMBAR INTERBODY FUSION (TLIF) WITH PEDICLE SCREW FIXATION 3 LEVEL Bilateral 04/24/2017   Procedure: TRANSFORAMINAL LUMBAR INTERBODY FUSION (TLIF) WITH PEDICLE SCREW FIXATION 3 LEVEL-L3-S1;  Surgeon: Deetta Perla, MD;  Location: [facility];  Service: Neurosurgery;  Laterality: Bilateral;    Family History No h/o GI disease or malignancy  Review of Systems  Constitutional:  Negative for activity change, appetite change, chills, diaphoresis, fatigue, fever and unexpected weight change.  HENT:  Negative for trouble swallowing and voice change.   Respiratory:  Negative for shortness of breath and wheezing.   Cardiovascular:  Negative for chest pain, palpitations and leg swelling.  Gastrointestinal:  Negative for abdominal distention, abdominal pain, anal bleeding, blood in stool, constipation, diarrhea, nausea, rectal pain and vomiting.       Worsening reflux  Musculoskeletal:  Negative for arthralgias and myalgias.  Skin:  Negative for color change and pallor.  Neurological:  Negative for dizziness, syncope and weakness.  Psychiatric/Behavioral:  Negative for confusion.   All other systems reviewed and are negative.    Medications No current facility-administered medications on file prior to encounter.   Current Outpatient Medications on File Prior to Encounter  Medication Sig Dispense Refill   SYNTHROID 125 MCG tablet daily before  breakfast.   4   atorvastatin (LIPITOR) 40 MG tablet TK 1 T PO QPM  0   Blood Glucose Monitoring Suppl (FIFTY50 GLUCOSE METER 2.0) w/Device KIT Check fasting sugar once daily.  E11.9     citalopram (CELEXA) 10 MG tablet Take 10 mg by mouth daily.     Cyanocobalamin (RA VITAMIN B-12 TR)  1000 MCG TBCR Take by mouth every morning.      diazepam (VALIUM) 5 MG tablet Take by mouth. (Patient not taking: Reported on 03/21/2022)     gabapentin (NEURONTIN) 300 MG capsule TK ONE C PO  QAM AND 2 CS Q NIGHT and one at lunch.  11   lisinopril-hydrochlorothiazide (PRINZIDE,ZESTORETIC) 10-12.5 MG tablet TK 1 T PO QD  4   metFORMIN (GLUCOPHAGE) 500 MG tablet 500 mg daily with supper.   12   methocarbamol (ROBAXIN) 500 MG tablet Take 2 tablets (1,000 mg total) by mouth every 8 (eight) hours as needed for muscle spasms. (Patient not taking: Reported on 03/21/2022) 90 tablet 0   Multiple Vitamin (MULTI-VITAMINS) TABS Take 1 tablet by mouth daily.      naproxen (NAPROSYN) 500 MG tablet 1 po bid with food and water when needed.  Do not take daily.     oxyCODONE (OXY IR/ROXICODONE) 5 MG immediate release tablet Take 1-3 tablets (5-15 mg total) by mouth every 4 (four) hours as needed for moderate pain (5 mg for pain <6, 10 mg for 7-8/10, 15 mg for 9-10/10). (Patient not taking: Reported on 03/21/2022) 30 tablet 0   RABEprazole (ACIPHEX) 20 MG tablet 20 mg daily.   0   topiramate (TOPAMAX) 100 MG tablet Take 100 mg by mouth daily. Evening. (Patient not taking: Reported on 03/21/2022)     Venlafaxine HCl 75 MG TB24 150 mg daily with breakfast.   11   Vitamin D, Ergocalciferol, 2000 units CAPS Take by mouth.      Pertinent medications related to GI and procedure were reviewed by me with the patient prior to the procedure   Current Facility-Administered Medications:    0.9 %  sodium chloride infusion, , Intravenous, Continuous, Annamaria Helling, DO, Last Rate: 20 mL/hr at 03/21/22 0920, Continued from Pre-op at 03/21/22 0920      Allergies  Allergen Reactions   Macrobid [Nitrofurantoin] Other (See Comments)    Flu like symptoms   Morphine And Related Other (See Comments)    Severe head aches.   Wellbutrin [Bupropion] Other (See Comments)    Suicidal thoughts   Allergies were reviewed by me prior  to the procedure  Objective   Body mass index is 37.12 kg/m. Vitals:   03/21/22 0835  BP: (!) 161/99  Pulse: 78  Resp: 20  Temp: (!) 96.9 F (36.1 C)  TempSrc: Temporal  SpO2: 98%  Weight: 104.3 kg  Height: $Remove'5\' 6"'NLpKVGR$  (1.676 m)     Physical Exam Vitals and nursing note reviewed.  Constitutional:      General: She is not in acute distress.    Appearance: Normal appearance. She is obese. She is not ill-appearing, toxic-appearing or diaphoretic.  HENT:     Head: Normocephalic and atraumatic.     Nose: Nose normal.     Mouth/Throat:     Mouth: Mucous membranes are moist.     Pharynx: Oropharynx is clear.  Eyes:     General: No scleral icterus.    Extraocular Movements: Extraocular movements intact.  Cardiovascular:     Rate and Rhythm: Normal rate and  regular rhythm.     Heart sounds: Normal heart sounds. No murmur heard.    No friction rub. No gallop.  Pulmonary:     Effort: Pulmonary effort is normal. No respiratory distress.     Breath sounds: Normal breath sounds. No wheezing, rhonchi or rales.  Abdominal:     General: Bowel sounds are normal. There is no distension.     Palpations: Abdomen is soft.     Tenderness: There is no abdominal tenderness. There is no guarding or rebound.  Musculoskeletal:     Cervical back: Neck supple.     Right lower leg: No edema.     Left lower leg: No edema.  Skin:    General: Skin is warm and dry.     Coloration: Skin is not jaundiced or pale.  Neurological:     General: No focal deficit present.     Mental Status: She is alert and oriented to person, place, and time. Mental status is at baseline.  Psychiatric:        Mood and Affect: Mood normal.        Behavior: Behavior normal.        Thought Content: Thought content normal.        Judgment: Judgment normal.      Assessment:  [name] is a 61 y.o. female  who presents today for Esophagogastroduodenoscopy and Colonoscopy for Acid reflux; screening  colonoscopy.  Plan:  Esophagogastroduodenoscopy and Colonoscopy with possible intervention today  Esophagogastroduodenoscopy and Colonoscopy with possible biopsy, control of bleeding, polypectomy, and interventions as necessary has been discussed with the patient/patient representative. Informed consent was obtained from the patient/patient representative after explaining the indication, nature, and risks of the procedure including but not limited to death, bleeding, perforation, missed neoplasm/lesions, cardiorespiratory compromise, and reaction to medications. Opportunity for questions was given and appropriate answers were provided. Patient/patient representative has verbalized understanding is amenable to undergoing the procedure.   Annamaria Helling, DO  [facility]  Portions of the record may have been created with voice recognition software. Occasional wrong-word or 'sound-a-like' substitutions may have occurred due to the inherent limitations of voice recognition software.  Read the chart carefully and recognize, using context, where substitutions may have occurred.

## 2022-03-21 ENCOUNTER — Ambulatory Visit: Payer: BC Managed Care – PPO | Admitting: General Practice

## 2022-03-21 ENCOUNTER — Encounter: Payer: Self-pay | Admitting: Gastroenterology

## 2022-03-21 ENCOUNTER — Other Ambulatory Visit: Payer: Self-pay

## 2022-03-21 ENCOUNTER — Ambulatory Visit
Admission: RE | Admit: 2022-03-21 | Discharge: 2022-03-21 | Disposition: A | Discharge status: Home / Self Care | Payer: BC Managed Care – PPO | Attending: Gastroenterology | Admitting: Gastroenterology

## 2022-03-21 ENCOUNTER — Encounter
Admission: RE | Disposition: A | Discharge status: Home / Self Care | Payer: Self-pay | Source: Home / Self Care | Attending: Gastroenterology

## 2022-03-21 DIAGNOSIS — Z85850 Personal history of malignant neoplasm of thyroid: Secondary | ICD-10-CM | POA: Diagnosis not present

## 2022-03-21 DIAGNOSIS — K317 Polyp of stomach and duodenum: Secondary | ICD-10-CM | POA: Diagnosis not present

## 2022-03-21 DIAGNOSIS — K2970 Gastritis, unspecified, without bleeding: Secondary | ICD-10-CM | POA: Insufficient documentation

## 2022-03-21 DIAGNOSIS — K640 First degree hemorrhoids: Secondary | ICD-10-CM | POA: Insufficient documentation

## 2022-03-21 DIAGNOSIS — Q402 Other specified congenital malformations of stomach: Secondary | ICD-10-CM | POA: Diagnosis not present

## 2022-03-21 DIAGNOSIS — F17210 Nicotine dependence, cigarettes, uncomplicated: Secondary | ICD-10-CM | POA: Diagnosis not present

## 2022-03-21 DIAGNOSIS — K219 Gastro-esophageal reflux disease without esophagitis: Secondary | ICD-10-CM | POA: Diagnosis not present

## 2022-03-21 DIAGNOSIS — Z1211 Encounter for screening for malignant neoplasm of colon: Secondary | ICD-10-CM | POA: Diagnosis present

## 2022-03-21 DIAGNOSIS — Z79899 Other long term (current) drug therapy: Secondary | ICD-10-CM | POA: Diagnosis not present

## 2022-03-21 DIAGNOSIS — E890 Postprocedural hypothyroidism: Secondary | ICD-10-CM | POA: Insufficient documentation

## 2022-03-21 DIAGNOSIS — Z90710 Acquired absence of both cervix and uterus: Secondary | ICD-10-CM | POA: Insufficient documentation

## 2022-03-21 DIAGNOSIS — K5730 Diverticulosis of large intestine without perforation or abscess without bleeding: Secondary | ICD-10-CM | POA: Insufficient documentation

## 2022-03-21 HISTORY — PX: ESOPHAGOGASTRODUODENOSCOPY: SHX5428

## 2022-03-21 HISTORY — PX: COLONOSCOPY: SHX5424

## 2022-03-21 SURGERY — COLONOSCOPY
Anesthesia: General

## 2022-03-21 MED ORDER — PROPOFOL 10 MG/ML IV BOLUS
INTRAVENOUS | Status: AC
  Filled 2022-03-21: qty 20

## 2022-03-21 MED ORDER — SODIUM CHLORIDE 0.9 % IV SOLN: INTRAVENOUS | Status: DC

## 2022-03-21 MED ORDER — PHENYLEPHRINE HCL-NACL 20-0.9 MG/250ML-% IV SOLN
INTRAVENOUS | Status: AC
  Filled 2022-03-21: qty 250

## 2022-03-21 MED ORDER — DEXMEDETOMIDINE (PRECEDEX) IN NS 20 MCG/5ML (4 MCG/ML) IV SYRINGE
PREFILLED_SYRINGE | INTRAVENOUS | Status: DC | PRN
  Administered 2022-03-21: 8 ug via INTRAVENOUS

## 2022-03-21 MED ORDER — STERILE WATER FOR IRRIGATION IR SOLN
Status: DC | PRN
  Administered 2022-03-21: 50 mL

## 2022-03-21 MED ORDER — PROPOFOL 10 MG/ML IV BOLUS
INTRAVENOUS | Status: DC | PRN
  Administered 2022-03-21: 100 mg via INTRAVENOUS

## 2022-03-21 MED ORDER — PROPOFOL 1000 MG/100ML IV EMUL
INTRAVENOUS | Status: AC
  Filled 2022-03-21: qty 100

## 2022-03-21 MED ORDER — PROPOFOL 500 MG/50ML IV EMUL
INTRAVENOUS | Status: DC | PRN
  Administered 2022-03-21: 150 ug/kg/min via INTRAVENOUS

## 2022-03-21 MED ORDER — LIDOCAINE HCL (CARDIAC) PF 100 MG/5ML IV SOSY
PREFILLED_SYRINGE | INTRAVENOUS | Status: DC | PRN
  Administered 2022-03-21: 100 mg via INTRAVENOUS
  Administered 2022-03-21: 50 mg via INTRAVENOUS

## 2022-03-21 MED ORDER — PHENYLEPHRINE 80 MCG/ML (10ML) SYRINGE FOR IV PUSH (FOR BLOOD PRESSURE SUPPORT)
PREFILLED_SYRINGE | INTRAVENOUS | Status: AC
  Filled 2022-03-21: qty 10

## 2022-03-21 NOTE — Transfer of Care (Signed)
Immediate Anesthesia Transfer of Care Note  Patient: Sherri Dickerson  Procedure(s) Performed: COLONOSCOPY ESOPHAGOGASTRODUODENOSCOPY (EGD)  Patient Location: Endoscopy Unit  Anesthesia Type:General  Level of Consciousness: awake and patient cooperative  Airway & Oxygen Therapy: Patient Spontanous Breathing  Post-op Assessment: Report given to RN and Post -op Vital signs reviewed and stable  Post vital signs: Reviewed and stable  Last Vitals:  Vitals Value Taken Time  BP    Temp 35.4 C 03/21/22 1025  Pulse 77 03/21/22 1026  Resp 16 03/21/22 1026  SpO2 100 % 03/21/22 1026    Last Pain:  Vitals:   03/21/22 1025  TempSrc: Temporal  PainSc:          Complications: No notable events documented.

## 2022-03-21 NOTE — Anesthesia Preprocedure Evaluation (Addendum)
Anesthesia Evaluation  Patient identified by MRN, date of birth, ID band Patient awake    Reviewed: Allergy & Precautions, NPO status , Patient's Chart, lab work & pertinent test results  Airway Mallampati: IV  TM Distance: >3 FB Neck ROM: full    Dental  (+) Chipped, Poor Dentition   Pulmonary sleep apnea , Current Smoker and Patient abstained from smoking.,    Pulmonary exam normal        Cardiovascular hypertension, (-) angina(-) CAD, (-) Past MI and (-) CABG negative cardio ROS Normal cardiovascular exam     Neuro/Psych PSYCHIATRIC DISORDERS negative neurological ROS     GI/Hepatic negative GI ROS, Neg liver ROS,   Endo/Other  diabetesHypothyroidism   Renal/GU negative Renal ROS  negative genitourinary   Musculoskeletal   Abdominal   Peds  Hematology negative hematology ROS (+)   Anesthesia Other Findings Past Medical History: No date: Anxiety No date: Arthritis     Comment:  osteo generalized 2012?: Cancer ([place])     Comment:  thyroid No date: Diabetes mellitus without complication ([facility]) No date: Gastritis No date: Headache     Comment:  migraines No date: Hyperlipidemia No date: Hypertension No date: Hypothyroidism No date: Personal history of radiation therapy No date: Sleep apnea No date: Status post radiation therapy     Comment:  thyroid  Past Surgical History: 2005: ABDOMINAL HYSTERECTOMY 2013?: COLONOSCOPY     Comment:  Dr Donnella Sham 07/25/2016: FORAMINOTOMY 2 LEVEL     Comment:  Procedure: FORAMINOTOMY 2 LEVEL;  Surgeon: Deetta Perla,               MD;  Location: [facility];  Service: Neurosurgery;;  L4-5,               L3-4 07/25/2016: LUMBAR LAMINECTOMY/DECOMPRESSION MICRODISCECTOMY; Left     Comment:  Procedure: LUMBAR LAMINECTOMY/DECOMPRESSION               MICRODISCECTOMY 2 LEVELS;  Surgeon: Deetta Perla, MD;                Location: [facility];  Service: Neurosurgery;  Laterality:                Left;  L3-4 hemilaminectomy and  L4-5 full laminectomy No date: OOPHORECTOMY 2012?: THYROIDECTOMY     Comment:  Dr Kathyrn Sheriff No date: TONSILLECTOMY 04/24/2017: TRANSFORAMINAL LUMBAR INTERBODY FUSION (TLIF) WITH PEDICLE  SCREW FIXATION 3 LEVEL; Bilateral     Comment:  Procedure: TRANSFORAMINAL LUMBAR INTERBODY FUSION (TLIF)              WITH PEDICLE SCREW FIXATION 3 LEVEL-L3-S1;  Surgeon:               Deetta Perla, MD;  Location: [facility];  Service:               Neurosurgery;  Laterality: Bilateral;  BMI    Body Mass Index: 37.12 kg/m      Reproductive/Obstetrics negative OB ROS                            Anesthesia Physical Anesthesia Plan  ASA: 2  Anesthesia Plan: General   Post-op Pain Management: Minimal or no pain anticipated   Induction: Intravenous  PONV Risk Score and Plan: 3 and Propofol infusion, TIVA and Ondansetron  Airway Management Planned: Nasal Cannula  Additional Equipment: None  Intra-op Plan:   Post-operative Plan:   Informed Consent: I have reviewed  the patients History and Physical, chart, labs and discussed the procedure including the risks, benefits and alternatives for the proposed anesthesia with the patient or authorized representative who has indicated his/her understanding and acceptance.     Dental advisory given  Plan Discussed with: CRNA and Surgeon  Anesthesia Plan Comments: (Discussed risks of anesthesia with patient, including possibility of difficulty with spontaneous ventilation under anesthesia necessitating airway intervention, PONV, and rare risks such as cardiac or respiratory or neurological events, and allergic reactions. Discussed the role of CRNA in patient's perioperative care. Patient understands.)        Anesthesia Quick Evaluation

## 2022-03-21 NOTE — Op Note (Signed)
[facility] Gastroenterology Patient Name: Sherri Dickerson Procedure Date: 03/21/2022 9:18 AM MRN: [id number] Account #: [id number] Date of Birth: 12-Oct-1960 Admit Type: Outpatient Age: 61 Room: [facility] ENDO ROOM 2 Gender: Female Note Status: Finalized Instrument Name: Upper Endoscope [id number] Procedure:             Upper GI endoscopy Indications:           Heartburn, Gastro-esophageal reflux disease Providers:             Annamaria Helling DO, DO Medicines:             Monitored Anesthesia Care Complications:         No immediate complications. Estimated blood loss:                         Minimal. Procedure:             Pre-Anesthesia Assessment:                        - Prior to the procedure, a History and Physical was                         performed, and patient medications and allergies were                         reviewed. The patient is competent. The risks and                         benefits of the procedure and the sedation options and                         risks were discussed with the patient. All questions                         were answered and informed consent was obtained.                         Patient identification and proposed procedure were                         verified by the physician, the nurse, the anesthetist                         and the technician in the endoscopy suite. Mental                         Status Examination: alert and oriented. Airway                         Examination: normal oropharyngeal airway and neck                         mobility. Respiratory Examination: clear to                         auscultation. CV Examination: RRR, no murmurs, no S3                         or S4. Prophylactic Antibiotics: The patient  does not                         require prophylactic antibiotics. Prior                         Anticoagulants: The patient has taken no previous                         anticoagulant or antiplatelet  agents. ASA Grade                         Assessment: II - A patient with mild systemic disease.                         After reviewing the risks and benefits, the patient                         was deemed in satisfactory condition to undergo the                         procedure. The anesthesia plan was to use monitored                         anesthesia care (MAC). Immediately prior to                         administration of medications, the patient was                         re-assessed for adequacy to receive sedatives. The                         heart rate, respiratory rate, oxygen saturations,                         blood pressure, adequacy of pulmonary ventilation, and                         response to care were monitored throughout the                         procedure. The physical status of the patient was                         re-assessed after the procedure.                        After obtaining informed consent, the endoscope was                         passed under direct vision. Throughout the procedure,                         the patient's blood pressure, pulse, and oxygen                         saturations were monitored continuously. The Endoscope  was introduced through the mouth, and advanced to the                         second part of duodenum. The upper GI endoscopy was                         accomplished without difficulty. The patient tolerated                         the procedure well. Findings:      A single 1 to 2 mm sessile polyp with no bleeding was found in the       second portion of the duodenum. Biopsies were taken with a cold forceps       for histology. Estimated blood loss was minimal.      The exam of the duodenum was otherwise normal.      Localized mild inflammation characterized by erosions and erythema was       found in the gastric antrum. Biopsies were taken with a cold forceps for       histology.  Biopsies were taken with a cold forceps for Helicobacter       pylori testing. Estimated blood loss was minimal.      Multiple less than 5 mm sessile polyps with no bleeding and no stigmata       of recent bleeding were found in the gastric body. Biopsies were taken       with a cold forceps for histology. Estimated blood loss was minimal.      The exam of the stomach was otherwise normal.      The Z-line was regular. Estimated blood loss: none.      Esophagogastric landmarks were identified: the gastroesophageal junction       was found at 35 cm from the incisors.      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 Biopsies were taken with a cold forceps for histology.       Estimated blood loss was minimal.      The exam of the esophagus was otherwise normal. Impression:            - A single duodenal polyp. Biopsied.                        - Gastritis. Biopsied.                        - Multiple gastric polyps. Biopsied.                        - Z-line regular.                        - Esophagogastric landmarks identified.                        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 Recommendation:        - Discharge patient to home.                        -  Patient has a contact number available for                         emergencies. The signs and symptoms of potential                         delayed complications were discussed with the patient.                         Return to normal activities tomorrow. Written                         discharge instructions were provided to the patient.                        - Resume previous diet.                        - Continue present medications.                        - No aspirin, ibuprofen, naproxen, or other                         non-steroidal anti-inflammatory drugs.                        - Await pathology results.                         - Repeat upper endoscopy for surveillance based on                         pathology results.                        - Return to GI office as previously scheduled.                        - The findings and recommendations were discussed with                         the patient.                        - proceed with colonoscopy Procedure Code(s):     --- Professional ---                        [phone], Esophagogastroduodenoscopy, flexible,                         transoral; with biopsy, single or multiple Diagnosis Code(s):     --- Professional ---                        K31.7, Polyp of stomach and duodenum                        K29.70, Gastritis, unspecified, without bleeding                        Q40.2, Other specified congenital malformations  of                         stomach                        R12, Heartburn                        K21.9, Gastro-esophageal reflux disease without                         esophagitis CPT copyright 2019 American [employer] Association. All rights reserved. The codes documented in this report are preliminary and upon coder review may  be revised to meet current compliance requirements. Attending Participation:      I personally performed the entire procedure. Volney American, DO Annamaria Helling DO, DO 03/21/2022 9:49:51 AM This report has been signed electronically. Number of Addenda: 0 Note Initiated On: 03/21/2022 9:18 AM Estimated Blood Loss:  Estimated blood loss was minimal.      [facility]

## 2022-03-21 NOTE — Interval H&P Note (Signed)
History and Physical Interval Note: Preprocedure H&P from 03/21/22  was reviewed and there was no interval change after seeing and examining the patient.  Written consent was obtained from the patient after discussion of risks, benefits, and alternatives. Patient has consented to proceed with Esophagogastroduodenoscopy and Colonoscopy with possible intervention   03/21/2022 9:29 AM  Sherri Dickerson  has presented today for surgery, with the diagnosis of Gastroesophageal reflux disease, unspecified whether esophagitis present (K21.9) Colon cancer screening (Z12.11).  The various methods of treatment have been discussed with the patient and family. After consideration of risks, benefits and other options for treatment, the patient has consented to  Procedure(s): COLONOSCOPY (N/A) ESOPHAGOGASTRODUODENOSCOPY (EGD) (N/A) as a surgical intervention.  The patient's history has been reviewed, patient examined, no change in status, stable for surgery.  I have reviewed the patient's chart and labs.  Questions were answered to the patient's satisfaction.     Annamaria Helling

## 2022-03-21 NOTE — Anesthesia Postprocedure Evaluation (Signed)
Anesthesia Post Note  Patient: Sherri Dickerson  Procedure(s) Performed: COLONOSCOPY ESOPHAGOGASTRODUODENOSCOPY (EGD)  Patient location during evaluation: PACU Anesthesia Type: General Level of consciousness: awake and alert Pain management: pain level controlled Vital Signs Assessment: post-procedure vital signs reviewed and stable Respiratory status: spontaneous breathing, nonlabored ventilation, respiratory function stable and patient connected to nasal cannula oxygen Cardiovascular status: blood pressure returned to baseline and stable Postop Assessment: no apparent nausea or vomiting Anesthetic complications: no   No notable events documented.   Last Vitals:  Vitals:   03/21/22 1029 03/21/22 1030  BP: (!) 140/85   Pulse: 74 77  Resp: 18 17  Temp:    SpO2: 99% 100%    Last Pain:  Vitals:   03/21/22 1025  TempSrc: Temporal  PainSc:                  Dimas Millin

## 2022-03-21 NOTE — Op Note (Signed)
[facility] Gastroenterology Patient Name: Sherri Dickerson Procedure Date: 03/21/2022 9:18 AM MRN: [id number] Account #: [id number] Date of Birth: 1960-09-12 Admit Type: Outpatient Age: 61 Room: [facility] ENDO ROOM 2 Gender: Female Note Status: Finalized Instrument Name: Colonoscope [id number] Procedure:             Colonoscopy Indications:           Screening for colorectal malignant neoplasm Providers:             Annamaria Helling DO, DO Medicines:             Monitored Anesthesia Care Complications:         No immediate complications. Estimated blood loss:                         Minimal. Procedure:             Pre-Anesthesia Assessment:                        - Prior to the procedure, a History and Physical was                         performed, and patient medications and allergies were                         reviewed. The patient is competent. The risks and                         benefits of the procedure and the sedation options and                         risks were discussed with the patient. All questions                         were answered and informed consent was obtained.                         Patient identification and proposed procedure were                         verified by the physician, the nurse, the anesthetist                         and the technician in the endoscopy suite. Mental                         Status Examination: alert and oriented. Airway                         Examination: normal oropharyngeal airway and neck                         mobility. Respiratory Examination: clear to                         auscultation. CV Examination: RRR, no murmurs, no S3                         or S4. Prophylactic Antibiotics: The patient does not  require prophylactic antibiotics. Prior                         Anticoagulants: The patient has taken no previous                         anticoagulant or antiplatelet agents. ASA  Grade                         Assessment: II - A patient with mild systemic disease.                         After reviewing the risks and benefits, the patient                         was deemed in satisfactory condition to undergo the                         procedure. The anesthesia plan was to use monitored                         anesthesia care (MAC). Immediately prior to                         administration of medications, the patient was                         re-assessed for adequacy to receive sedatives. The                         heart rate, respiratory rate, oxygen saturations,                         blood pressure, adequacy of pulmonary ventilation, and                         response to care were monitored throughout the                         procedure. The physical status of the patient was                         re-assessed after the procedure.                        After obtaining informed consent, the colonoscope was                         passed under direct vision. Throughout the procedure,                         the patient's blood pressure, pulse, and oxygen                         saturations were monitored continuously. The                         Colonoscope was introduced through the anus and  advanced to the the cecum, identified by appendiceal                         orifice and ileocecal valve. The colonoscopy was                         performed without difficulty. The patient tolerated                         the procedure well. The quality of the bowel                         preparation was evaluated using the BBPS [facility] Bowel                         Preparation Scale) with scores of: Right Colon = 2                         (minor amount of residual staining, small fragments of                         stool and/or opaque liquid, but mucosa seen well),                         Transverse Colon = 3 (entire mucosa seen well  with no                         residual staining, small fragments of stool or opaque                         liquid) and Left Colon = 3 (entire mucosa seen well                         with no residual staining, small fragments of stool or                         opaque liquid). The total BBPS score equals 8. The                         quality of the bowel preparation was excellent. The                         ileocecal valve, appendiceal orifice, and rectum were                         photographed. Findings:      The perianal and digital rectal examinations were normal. Pertinent       negatives include normal sphincter tone.      Multiple small-mouthed diverticula were found in the left colon.       Estimated blood loss: none.      Non-bleeding internal hemorrhoids were found during retroflexion. The       hemorrhoids were Grade I (internal hemorrhoids that do not prolapse).       Estimated blood loss: none.      Two sessile polyps were found in the transverse colon and cecum. The       polyps were 4  to 7 mm in size. These polyps were removed with a cold       snare. Resection and retrieval were complete. Estimated blood loss was       minimal.      The exam was otherwise without abnormality on direct and retroflexion       views. Impression:            - Diverticulosis in the left colon.                        - Non-bleeding internal hemorrhoids.                        - Two 4 to 7 mm polyps in the transverse colon and in                         the cecum, removed with a cold snare. Resected and                         retrieved.                        - The examination was otherwise normal on direct and                         retroflexion views. Recommendation:        - Discharge patient to home.                        - Patient has a contact number available for                         emergencies. The signs and symptoms of potential                         delayed  complications were discussed with the patient.                         Return to normal activities tomorrow. Written                         discharge instructions were provided to the patient.                        - Resume previous diet.                        - Continue present medications.                        - No aspirin, ibuprofen, naproxen, or other                         non-steroidal anti-inflammatory drugs for 5 days after                         polyp removal.                        - consider increasing proton pump inhibitor (aciphex)  to twice daily. Do your best to discontinue celebrex                         and tobacco use.                        - Await pathology results.                        - Repeat colonoscopy for surveillance based on                         pathology results.                        - Return to GI office as previously scheduled.                        - The findings and recommendations were discussed with                         the patient. Procedure Code(s):     --- Professional ---                        [phone], Colonoscopy, flexible; with removal of                         tumor(s), polyp(s), or other lesion(s) by snare                         technique Diagnosis Code(s):     --- Professional ---                        Z12.11, Encounter for screening for malignant neoplasm                         of colon                        K64.0, First degree hemorrhoids                        K63.5, Polyp of colon                        K57.30, Diverticulosis of large intestine without                         perforation or abscess without bleeding CPT copyright 2019 American Medical Association. All rights reserved. The codes documented in this report are preliminary and upon coder review may  be revised to meet current compliance requirements. Attending Participation:      I personally performed the entire  procedure. Volney American, DO Annamaria Helling DO, DO 03/21/2022 10:26:45 AM This report has been signed electronically. Number of Addenda: 0 Note Initiated On: 03/21/2022 9:18 AM Scope Withdrawal Time: 0 hours 18 minutes 21 seconds  Total Procedure Duration: 0 hours 28 minutes 10 seconds  Estimated Blood Loss:  Estimated blood loss was minimal.      [facility]

## 2022-03-22 ENCOUNTER — Encounter: Payer: Self-pay | Admitting: Gastroenterology

## 2022-03-22 LAB — SURGICAL PATHOLOGY

## 2022-03-23 ENCOUNTER — Emergency Department: Payer: BC Managed Care – PPO

## 2022-03-23 ENCOUNTER — Emergency Department
Admission: EM | Admit: 2022-03-23 | Discharge: 2022-03-23 | Disposition: A | Discharge status: Home / Self Care | Payer: BC Managed Care – PPO | Attending: Emergency Medicine | Admitting: Emergency Medicine

## 2022-03-23 ENCOUNTER — Other Ambulatory Visit: Payer: Self-pay

## 2022-03-23 DIAGNOSIS — I10 Essential (primary) hypertension: Secondary | ICD-10-CM | POA: Insufficient documentation

## 2022-03-23 DIAGNOSIS — R42 Dizziness and giddiness: Secondary | ICD-10-CM | POA: Diagnosis present

## 2022-03-23 DIAGNOSIS — R079 Chest pain, unspecified: Secondary | ICD-10-CM | POA: Diagnosis not present

## 2022-03-23 DIAGNOSIS — E860 Dehydration: Secondary | ICD-10-CM | POA: Insufficient documentation

## 2022-03-23 DIAGNOSIS — E119 Type 2 diabetes mellitus without complications: Secondary | ICD-10-CM | POA: Diagnosis not present

## 2022-03-23 LAB — CBC
HCT: 42.9 % (ref 36.0–46.0)
Hemoglobin: 14.6 g/dL (ref 12.0–15.0)
MCH: 31.6 pg (ref 26.0–34.0)
MCHC: 34 g/dL (ref 30.0–36.0)
MCV: 92.9 fL (ref 80.0–100.0)
Platelets: 239 10*3/uL (ref 150–400)
RBC: 4.62 MIL/uL (ref 3.87–5.11)
RDW: 13.3 % (ref 11.5–15.5)
WBC: 11.3 10*3/uL — ABNORMAL HIGH (ref 4.0–10.5)
nRBC: 0 % (ref 0.0–0.2)

## 2022-03-23 LAB — BASIC METABOLIC PANEL
Anion gap: 9 (ref 5–15)
BUN: 13 mg/dL (ref 8–23)
CO2: 25 mmol/L (ref 22–32)
Calcium: 9.2 mg/dL (ref 8.9–10.3)
Chloride: 102 mmol/L (ref 98–111)
Creatinine, Ser: 1.2 mg/dL — ABNORMAL HIGH (ref 0.44–1.00)
GFR, Estimated: 52 mL/min — ABNORMAL LOW (ref >60)
Glucose, Bld: 131 mg/dL — ABNORMAL HIGH (ref 70–99)
Potassium: 3 mmol/L — ABNORMAL LOW (ref 3.5–5.1)
Sodium: 136 mmol/L (ref 135–145)

## 2022-03-23 LAB — TROPONIN I (HIGH SENSITIVITY): Troponin I (High Sensitivity): 13 ng/L (ref <18)

## 2022-03-23 MED ORDER — SODIUM CHLORIDE 0.9 % IV BOLUS
1000.0000 mL | Freq: Once | INTRAVENOUS | Status: AC
  Administered 2022-03-23: 1000 mL via INTRAVENOUS

## 2022-03-23 NOTE — ED Triage Notes (Signed)
Pt to ED POV from [facility] for dizziness, and chest pain that started Monday. States had colonoscopy on Monday.  Denies shob, n/v

## 2022-03-23 NOTE — ED Notes (Signed)
Patient transported to X-ray 

## 2022-03-23 NOTE — ED Triage Notes (Signed)
First Nurse: Pt here from [facility] with CP and hypotension. Pt consumed colonoscopy prep over the weekend and got very thirsty. Pt then began having htn, dizziness, chills, and chest "twinges" per pt.

## 2022-03-23 NOTE — ED Provider Notes (Signed)
[facility] Provider Note    None    (approximate)  History   Chief Complaint: Chest Pain  HPI  Sherri Dickerson is a 61 y.o. female with a past medical history of anxiety, diabetes, hypertension, hyperlipidemia presents to the emergency department for dizziness.  According to the patient she did a bowel prep over the weekend had a colonoscopy on Monday.  She states since Monday she has been experiencing intermittent episodes of dizziness especially upon standing she also states intermittent chest discomfort described more as a tightness in her chest at times.  Overall the patient appears well denies any shortness of breath, no nausea or vomiting.  Physical Exam   Triage Vital Signs: ED Triage Vitals [03/23/22 0916]  Enc Vitals Group     BP (!) 133/91     Pulse Rate 96     Resp 18     Temp 98.2 F (36.8 C)     Temp Source Oral     SpO2 99 %     Weight 229 lb 4.5 oz (104 kg)     Height '5\' 6"'$  (1.676 m)     Head Circumference      Peak Flow      Pain Score 0     Pain Loc      Pain Edu?      Excl. in [place]?     Most recent vital signs: Vitals:   03/23/22 0916  BP: (!) 133/91  Pulse: 96  Resp: 18  Temp: 98.2 F (36.8 C)  SpO2: 99%    General: Awake, no distress.  CV:  Good peripheral perfusion.  Regular rate and rhythm  Resp:  Normal effort.  Equal breath sounds bilaterally.  Abd:  No distention.  Soft, nontender.  No rebound or guarding.   ED Results / Procedures / Treatments   EKG  EKG viewed and interpreted by myself shows normal sinus rhythm at 98 bpm with a narrow QRS, normal axis, normal intervals, nonspecific but no concerning ST changes.  RADIOLOGY  I personally viewed and interpreted the x-ray images that has any obvious acute abnormality seen on my evaluation. Radiology is read the chest x-ray is negative  MEDICATIONS ORDERED IN ED: Medications  sodium chloride 0.9 % bolus 1,000 mL (has no administration in time range)      IMPRESSION / MDM / ASSESSMENT AND PLAN / ED COURSE  I reviewed the triage vital signs and the nursing notes.  Patient's presentation is most consistent with acute presentation with potential threat to life or bodily function.  Patient presents emergency department for dizziness since Monday as well as intermittent chest discomfort since Monday as well.  Denies any chest discomfort currently.  Denies any nausea or shortness of breath at any point.  Patient states diarrhea throughout the weekend doing the bowel prep.  Patient's lab work today shows a negative troponin, reassuring EKG, reassuring CBC patient's chemistry shows creatinine elevation compared to baseline likely indicating dehydration.  Patient states that dizziness is much worse when she first stands up or sits up again likely indicating normal orthostatic process.  We will IV hydrate and reassess.  Patient agreeable to plan of care.  Patient is feeling better after IV hydration.  Will discharge patient home with PCP follow-up.  Discussed with patient continued oral hydration and supportive care at home  FINAL CLINICAL IMPRESSION(S) / ED DIAGNOSES   Dizziness Dehydration  Note:  This document was prepared using Dragon voice recognition software  and may include unintentional dictation errors.   Harvest Dark, MD 03/23/22 1126

## 2022-06-23 NOTE — Progress Notes (Signed)
Referring Physician:  Donnamarie Rossetti, PA-C [place],  [place] [address]  Primary Physician:  Sherri Rossetti, PA-C  History of Present Illness: 06/24/2022 [name] is s/p L3-S1 TLIF on 04/24/2017 by Dr. Lacinda Dickerson. He last saw her on 06/23/20. Per his note, the CT scan does showed some lucency around screws which is consistent with a pseudoarthrosis. Given that she has had back pain since her prior surgery, he was concerned she may not get significant relief from additional fusion which would consist of extension to L2 to pelvis. He recommended possible spinal cord stimulator trial as well as referral to pain clinic before considering an additional fusion.   She did not pursue pain  management or SCS and she had persistent LBP with right leg pain since above surgery.   She did a lot of walking at an event in October and now has constant LBP with left > right leg pain that generally stops at her knees for last 4-6 weeks. Pain is worse with prolonged sitting. No numbness or tingling. She has occasional weakness in both legs.   She smokes about 1/2 ppd.    Conservative measures:  Physical therapy: none recent  Multimodal medical therapy including regular antiinflammatories: neurontin  Injections: No recent epidural steroid injections. She had some after surgery that did not help.   Past Surgery: L3-S1 TLIF on 04/24/2017 by Dr. Shellia Cleverly B Dickerson has no symptoms of cervical myelopathy.  The symptoms are causing a significant impact on the patient's life.   Review of Systems:  A 10 point review of systems is negative, except for the pertinent positives and negatives detailed in the HPI.  Past Medical History: Past Medical History:  Diagnosis Date   Anxiety    Arthritis    osteo generalized   Cancer ([place]) 2012?   thyroid   Diabetes mellitus without complication ([place])    Gastritis    Headache    migraines   Hyperlipidemia    Hypertension     Hypothyroidism    Personal history of radiation therapy    Sleep apnea    Status post radiation therapy    thyroid    Past Surgical History: Past Surgical History:  Procedure Laterality Date   ABDOMINAL HYSTERECTOMY  2005   COLONOSCOPY  2013?   Dr Donnella Sham   COLONOSCOPY N/A 03/21/2022   Procedure: COLONOSCOPY;  Surgeon: Annamaria Helling, DO;  Location: [facility] ENDOSCOPY;  Service: Gastroenterology;  Laterality: N/A;   ESOPHAGOGASTRODUODENOSCOPY N/A 03/21/2022   Procedure: ESOPHAGOGASTRODUODENOSCOPY (EGD);  Surgeon: Annamaria Helling, DO;  Location: [facility] ENDOSCOPY;  Service: Gastroenterology;  Laterality: N/A;   FORAMINOTOMY 2 LEVEL  07/25/2016   Procedure: FORAMINOTOMY 2 LEVEL;  Surgeon: Deetta Perla, MD;  Location: [facility] ORS;  Service: Neurosurgery;;  L4-5, L3-4   LUMBAR LAMINECTOMY/DECOMPRESSION MICRODISCECTOMY Left 07/25/2016   Procedure: LUMBAR LAMINECTOMY/DECOMPRESSION MICRODISCECTOMY 2 LEVELS;  Surgeon: Deetta Perla, MD;  Location: [facility] ORS;  Service: Neurosurgery;  Laterality: Left;  L3-4 hemilaminectomy and  L4-5 full laminectomy   OOPHORECTOMY     THYROIDECTOMY  2012?   Dr Kathyrn Sheriff   TONSILLECTOMY     TRANSFORAMINAL LUMBAR INTERBODY FUSION (TLIF) WITH PEDICLE SCREW FIXATION 3 LEVEL Bilateral 04/24/2017   Procedure: TRANSFORAMINAL LUMBAR INTERBODY FUSION (TLIF) WITH PEDICLE SCREW FIXATION 3 LEVEL-L3-S1;  Surgeon: Deetta Perla, MD;  Location: [facility] ORS;  Service: Neurosurgery;  Laterality: Bilateral;    Allergies: Allergies as of 06/24/2022 - Review Complete 06/24/2022  Allergen Reaction Noted  Liraglutide Other (See Comments) 01/23/2017   Macrobid [nitrofurantoin] Other (See Comments) 04/18/2017   Morphine and related Other (See Comments) 11/22/2013   Wellbutrin [bupropion] Other (See Comments) 07/19/2016    Medications: Outpatient Encounter Medications as of 06/24/2022  Medication Sig   atorvastatin (LIPITOR) 40 MG tablet TK 1 T PO QPM   Blood Glucose Monitoring Suppl  (FIFTY50 GLUCOSE METER 2.0) w/Device KIT Check fasting sugar once daily.  E11.9   citalopram (CELEXA) 10 MG tablet Take 10 mg by mouth daily.   Cyanocobalamin (RA VITAMIN B-12 TR) 1000 MCG TBCR Take by mouth every morning.    diazepam (VALIUM) 5 MG tablet Take by mouth. (Patient not taking: Reported on 03/21/2022)   gabapentin (NEURONTIN) 300 MG capsule TK ONE C PO  QAM AND 2 CS Q NIGHT and one at lunch.   lisinopril-hydrochlorothiazide (PRINZIDE,ZESTORETIC) 10-12.5 MG tablet TK 1 T PO QD   metFORMIN (GLUCOPHAGE) 500 MG tablet 500 mg daily with supper.    methocarbamol (ROBAXIN) 500 MG tablet Take 2 tablets (1,000 mg total) by mouth every 8 (eight) hours as needed for muscle spasms. (Patient not taking: Reported on 03/21/2022)   Multiple Vitamin (MULTI-VITAMINS) TABS Take 1 tablet by mouth daily.    naproxen (NAPROSYN) 500 MG tablet 1 po bid with food and water when needed.  Do not take daily.   oxyCODONE (OXY IR/ROXICODONE) 5 MG immediate release tablet Take 1-3 tablets (5-15 mg total) by mouth every 4 (four) hours as needed for moderate pain (5 mg for pain <6, 10 mg for 7-8/10, 15 mg for 9-10/10). (Patient not taking: Reported on 03/21/2022)   RABEprazole (ACIPHEX) 20 MG tablet 20 mg daily.    SYNTHROID 125 MCG tablet daily before breakfast.    topiramate (TOPAMAX) 100 MG tablet Take 100 mg by mouth daily. Evening. (Patient not taking: Reported on 03/21/2022)   Venlafaxine HCl 75 MG TB24 150 mg daily with breakfast.    Vitamin D, Ergocalciferol, 2000 units CAPS Take by mouth.   No facility-administered encounter medications on file as of 06/24/2022.    Social History: Social History   Tobacco Use   Smoking status: Light Smoker    Packs/day: 0.25    Types: Cigarettes   Smokeless tobacco: Never   Tobacco comments:    2-3 cig per day.  Vaping Use   Vaping Use: Never used  Substance Use Topics   Alcohol use: Not Currently    Comment: occ   Drug use: No    Family Medical History: Family  History  Problem Relation Age of Onset   Diabetes Mother    Dementia Father    Breast cancer Neg Hx     Physical Examination: Vitals:   06/24/22 1018  BP: 138/82    General: Patient is well developed, well nourished, calm, collected, and in no apparent distress. Attention to examination is appropriate.  Respiratory: Patient is breathing without any difficulty.   NEUROLOGICAL:     Awake, alert, oriented to person, place, and time.  Speech is clear and fluent. Fund of knowledge is appropriate.   Cranial Nerves: Pupils equal round and reactive to light.  Facial tone is symmetric.  Facial sensation is symmetric.  Reasonable ROM of lumbar spine with no pain  Well healed lumbar incision. No lower lumbar tenderness.   No abnormal lesions on exposed skin.   Strength: Side Biceps Triceps Deltoid Interossei Grip Wrist Ext. Wrist Flex.  R _0 L 5 5 5  _0 Side Iliopsoas Quads Hamstring PF DF EHL  R _1 L _2 Reflexes are 2+ and symmetric at the biceps, triceps, brachioradialis, patella and achilles.   Hoffman's is absent.  Clonus is not present.   Bilateral upper and lower extremity sensation is intact to light touch.     Gait is normal.     Medical Decision Making  Imaging: Lumbar MRI scan dated 05/27/20:  FINDINGS: Segmentation:  Normal   Alignment: 3 mm retrolisthesis L2-3 unchanged. 7 mm retrolisthesis L3-4 has progressed since the prior MRI. 3 mm retrolisthesis L4-5 unchanged.   Vertebrae:  Negative for fracture or mass.   Conus medullaris and cauda equina: Conus extends to the L1 level. Conus and cauda equina appear normal.   Paraspinal and other soft tissues: Negative for paraspinous mass, adenopathy, or fluid collection.   Disc levels:   L1-2: Minimal disc degeneration.  Negative for stenosis   L2-3: Mild to moderate spinal stenosis with progression. Diffuse disc bulging and mild facet hypertrophy bilaterally.    L3-4: Pedicle screw and interbody fusion with posterior decompression. Spinal canal adequately decompression. Mild subarticular stenosis bilaterally   L4-5: Pedicle screw and interbody fusion with posterior decompression. Spinal canal adequately decompressed. Mild subarticular stenosis bilaterally with significant improvement since the prior MRI   L5-S1: Bilateral pedicle screw fusion without interbody fusion. Diffuse bulging of the disc and bilateral facet hypertrophy. Mild subarticular and foraminal stenosis on the left.   IMPRESSION: Lumbar fusion L3 through S1.  No acute skeletal abnormality.   Satisfactory decompression of the spinal canal at L3-4 and L4-5 with mild subarticular stenosis bilaterally   Mild subarticular and foraminal stenosis on the left L5-S1   Mild to moderate spinal stenosis L2-3 with progression from the prior MRI.     Electronically Signed   By: Franchot Gallo M.D.   On: 05/28/2020 08:06   Lumbar CT scan dated 06/03/20:  FINDINGS: Segmentation: Normal   Alignment: Mild retrolisthesis L2-3. 5 mm retrolisthesis L3-4. Mild retrolisthesis L4-5.   Vertebrae: Negative for fracture or mass.   Paraspinal and other soft tissues: Negative for paraspinous mass, adenopathy, or fluid collection.   Disc levels: T12-L1: Negative   L1-2: Mild disc and mild facet degeneration.  Negative for stenosis.   L2-3: Anterior disc degeneration with disc space narrowing, gas in the disc space, and vertebral sclerosis anteriorly. There is diffuse disc bulging and mild facet degeneration. Mild to moderate spinal stenosis.   L3-4: Bilateral pedicle screw and interbody fusion. There is mild loosening of the L3 screws bilaterally. The right L3 screw protrudes through the superior endplate into the disc space at L2-3. Interbody spacer in satisfactory position. Solid bony fusion not definitely identified.   L4-5: Bilateral pedicle screw and interbody fusion. No  significant hardware misplacement or loosening. Solid interbody fusion not definitely identified. Posterior decompression. No significant spinal stenosis.   L5-S1: Bilateral pedicle screw fusion without interbody fusion. There is extensive lucency around the S1 screws bilaterally due to loosening. Mild facet degeneration. Mild subarticular stenosis bilaterally.   IMPRESSION: Pedicle screw fusion extending from L3 through S1 bilaterally. There is loosening of the L3 screws bilaterally. The right L3 screw protrudes through the superior endplate of L3 into the L2-3 disc space. There is extensive lucency around the S1 screws bilaterally. Solid bony fusion not identified at L3-4 or L4-5.   Adjacent segment degeneration at L2-3 with mild  to moderate spinal stenosis.     Electronically Signed   By: Franchot Gallo M.D.   On: 06/03/2020 17:01  I have personally reviewed the images and agree with the above interpretation.  Above images reviewed with Dr. Izora Ribas after her appointment.   Assessment and Plan: Ms. Ducey is a pleasant 61 y.o. female is s/p L3-S1 TLIF on 04/24/2017 by Dr. Lacinda Dickerson. She's had constant LBP with right leg pain since the surgery.   In last 4-6 weeks, she has started with constant LBP with left > right leg pain that generally stops at her knees.   Imaging in 2021 showed lucency around her screws and pseudoarthrosis. She elected not to see pain management to consider SCS.   Treatment options discussed with patient and following plan made:   - Imaging reviewed with Dr. Izora Ribas after her visit, she is likely a candidate for a revision fusion L2-S1.  - Recommend updated lumbar MRI to further evaluate bilateral lumbar radiculopathy.  - Recommend lumbar CT scan to evaluate previously seen pseudoarthrosis.  - She also will need AP/lat scoliosis xrays and lumbar flex/ext xrays prior to any surgical discussion.  - Recommend she revisit PT prior to further surgery.  - She  will need to stop smoking prior to surgery. She is currently smoking 1/2 ppd.   I left her a VM to call me back about above plan. I also sent her a MyChart message. Recommend she have imaging done and start PT while she works on smoking cessation then she can f/u with Dr. Izora Ribas in 6-8 weeks. Will see how she wants to proceed.   I spent a total of 30 minutes in face-to-face and non-face-to-face activities related to this patient's care toda including review of outside records, review of imaging, review of symptoms, physical exam, discussion of differential diagnosis, discussion of treatment options, and documentation.   Thank you for involving me in the care of this patient.   Geronimo Boot PA-C Dept. of Neurosurgery

## 2022-06-24 ENCOUNTER — Ambulatory Visit (INDEPENDENT_AMBULATORY_CARE_PROVIDER_SITE_OTHER): Payer: BC Managed Care – PPO | Admitting: Orthopedic Surgery

## 2022-06-24 ENCOUNTER — Encounter: Payer: Self-pay | Admitting: Orthopedic Surgery

## 2022-06-24 VITALS — BP 138/82 | Ht 66.0 in | Wt 229.8 lb

## 2022-06-24 DIAGNOSIS — S32009K Unspecified fracture of unspecified lumbar vertebra, subsequent encounter for fracture with nonunion: Secondary | ICD-10-CM

## 2022-06-24 DIAGNOSIS — M5416 Radiculopathy, lumbar region: Secondary | ICD-10-CM | POA: Diagnosis not present

## 2022-06-24 DIAGNOSIS — Z981 Arthrodesis status: Secondary | ICD-10-CM

## 2022-06-27 NOTE — Telephone Encounter (Signed)
Reviewed with Dr. Izora Ribas and he recommended:   - Recommend updated lumbar MRI to further evaluate bilateral lumbar radiculopathy.  - Recommend lumbar CT scan to evaluate previously seen pseudoarthrosis.  - She also will need AP/lat scoliosis xrays and lumbar flex/ext xrays prior to any surgical discussion.  - Recommend she revisit PT prior to further surgery.  - She will need to stop smoking prior to surgery. She is currently smoking 1/2 ppd.   Will order lumbar MRI and CT. Will order scoliosis and lumbar Xrs.   Will order PT once she lets me know where she wants to go.   She will work on smoking cessation.   Please make her a follow up with Dr. Izora Ribas in 6-8 weeks.

## 2022-06-27 NOTE — Addendum Note (Signed)
Addended by: Berdine Addison on: 06/27/2022 05:50 PM   Modules accepted: Orders

## 2022-07-22 ENCOUNTER — Ambulatory Visit
Admission: RE | Admit: 2022-07-22 | Discharge: 2022-07-22 | Disposition: A | Discharge status: Home / Self Care | Payer: BC Managed Care – PPO | Source: Ambulatory Visit | Attending: Orthopedic Surgery | Admitting: Orthopedic Surgery

## 2022-07-22 DIAGNOSIS — Z981 Arthrodesis status: Secondary | ICD-10-CM | POA: Diagnosis present

## 2022-07-22 DIAGNOSIS — S32009K Unspecified fracture of unspecified lumbar vertebra, subsequent encounter for fracture with nonunion: Secondary | ICD-10-CM | POA: Insufficient documentation

## 2022-07-22 DIAGNOSIS — M5416 Radiculopathy, lumbar region: Secondary | ICD-10-CM

## 2022-08-03 NOTE — Progress Notes (Signed)
Referring Physician:  Wilford Corner, PA-C [address] [place],  [place] [address]  Primary Physician:  Wilford Corner, PA-C  History of Present Illness: 08/04/2022 Sherri Dickerson is here today with a chief complaint of low back pain that radiates into the bilateral leg pain that mainly stops at the knee, but will occasionally go into the foot.  She has been having symptoms since October 2023.  Her pain can be as bad as 10 out of 10.  Prolonged sitting and standing make it worse.  She has noticed worsening back pain.  Her pain is at the upper part of her lower back. Bowel/Bladder Dysfunction: none  Conservative measures:  Physical therapy: has not participated in Multimodal medical therapy including regular antiinflammatories:  gabapentin, tylenol Injections:  no recent epidural steroid injections but she has had then after surgery with no relief  Past Surgery:  04/24/18: L3-S1 TLIF by Dr. Vern Claude B Hudspeth has no symptoms of cervical myelopathy.  The symptoms are causing a significant impact on the patient's life.    Progress Note from Drake Leach, [place] on 06/24/22: History of Present Illness: 06/24/2022 [name] is s/p L3-S1 TLIF on 04/24/2017 by Dr. Adriana Simas. He last saw her on 06/23/20. Per his note, the CT scan does showed some lucency around screws which is consistent with a pseudoarthrosis. Given that she has had back pain since her prior surgery, he was concerned she may not get significant relief from additional fusion which would consist of extension to L2 to pelvis. He recommended possible spinal cord stimulator trial as well as referral to pain clinic before considering an additional fusion.    She did not pursue pain  management or SCS and she had persistent LBP with right leg pain since above surgery.    She did a lot of walking at an event in October and now has constant LBP with left > right leg pain that generally stops at her knees for  last 4-6 weeks. Pain is worse with prolonged sitting. No numbness or tingling. She has occasional weakness in both legs.  I have utilized the care everywhere function in epic to review the outside records available from external health systems.  Review of Systems:  A 10 point review of systems is negative, except for the pertinent positives and negatives detailed in the HPI.  Past Medical History: Past Medical History:  Diagnosis Date   Anxiety    Arthritis    osteo generalized   Cancer ([facility]) 2012?   thyroid   Diabetes mellitus without complication ([facility])    Gastritis    Headache    migraines   Hyperlipidemia    Hypertension    Hypothyroidism    Personal history of radiation therapy    Sleep apnea    Status post radiation therapy    thyroid    Past Surgical History: Past Surgical History:  Procedure Laterality Date   ABDOMINAL HYSTERECTOMY  2005   COLONOSCOPY  2013?   Dr Ricki Rodriguez   COLONOSCOPY N/A 03/21/2022   Procedure: COLONOSCOPY;  Surgeon: Jaynie Collins, DO;  Location: [facility] ENDOSCOPY;  Service: Gastroenterology;  Laterality: N/A;   ESOPHAGOGASTRODUODENOSCOPY N/A 03/21/2022   Procedure: ESOPHAGOGASTRODUODENOSCOPY (EGD);  Surgeon: Jaynie Collins, DO;  Location: [facility] ENDOSCOPY;  Service: Gastroenterology;  Laterality: N/A;   FORAMINOTOMY 2 LEVEL  07/25/2016   Procedure: FORAMINOTOMY 2 LEVEL;  Surgeon: Lucy Chris, MD;  Location: [facility] ORS;  Service: Neurosurgery;;  L4-5, L3-4  LUMBAR LAMINECTOMY/DECOMPRESSION MICRODISCECTOMY Left 07/25/2016   Procedure: LUMBAR LAMINECTOMY/DECOMPRESSION MICRODISCECTOMY 2 LEVELS;  Surgeon: Lucy Chris, MD;  Location: [facility] ORS;  Service: Neurosurgery;  Laterality: Left;  L3-4 hemilaminectomy and  L4-5 full laminectomy   OOPHORECTOMY     THYROIDECTOMY  2012?   Dr Elenore Rota   TONSILLECTOMY     TRANSFORAMINAL LUMBAR INTERBODY FUSION (TLIF) WITH PEDICLE SCREW FIXATION 3 LEVEL Bilateral 04/24/2017   Procedure: TRANSFORAMINAL LUMBAR  INTERBODY FUSION (TLIF) WITH PEDICLE SCREW FIXATION 3 LEVEL-L3-S1;  Surgeon: Lucy Chris, MD;  Location: [facility] ORS;  Service: Neurosurgery;  Laterality: Bilateral;    Allergies: Allergies as of 08/04/2022 - Review Complete 08/04/2022  Allergen Reaction Noted   Liraglutide Other (See Comments) 01/23/2017   Macrobid [nitrofurantoin] Other (See Comments) 04/18/2017   Morphine and related Other (See Comments) 11/22/2013   Wellbutrin [bupropion] Other (See Comments) 07/19/2016    Medications: No outpatient medications have been marked as taking for the 08/04/22 encounter (Office Visit) with Venetia Night, MD.    Social History: Social History   Tobacco Use   Smoking status: Former    Types: Cigarettes    Quit date: 07/15/2022    Years since quitting: 0.0   Smokeless tobacco: Never  Vaping Use   Vaping Use: Never used  Substance Use Topics   Alcohol use: Not Currently    Comment: occ   Drug use: No    Family Medical History: Family History  Problem Relation Age of Onset   Diabetes Mother    Dementia Father    Breast cancer Neg Hx     Physical Examination: There were no vitals filed for this visit.  General: Patient is well developed, well nourished, calm, collected, and in no apparent distress. Attention to examination is appropriate.  Neck:   Supple.  Full range of motion.  Respiratory: Patient is breathing without any difficulty.   NEUROLOGICAL:     Awake, alert, oriented to person, place, and time.  Speech is clear and fluent. Fund of knowledge is appropriate.   Cranial Nerves: Pupils equal round and reactive to light.  Facial tone is symmetric.  Facial sensation is symmetric. Shoulder shrug is symmetric. Tongue protrusion is midline.  There is no pronator drift.  ROM of spine: full.    Strength: Side Biceps Triceps Deltoid Interossei Grip Wrist Ext. Wrist Flex.  R 5 5 5 5 5 5 5   L 5 5 5 5 5 5 5    Side Iliopsoas Quads Hamstring PF DF EHL  R 5 5 5 5 5  5   L 5 5 5 5 5 5    Reflexes are 1+ and symmetric at the biceps, triceps, brachioradialis, patella and achilles.   Hoffman's is absent.   Bilateral upper and lower extremity sensation is intact to light touch.    No evidence of dysmetria noted.  Gait is normal.     Medical Decision Making  Imaging: CT L spine 07/22/2022 IMPRESSION: 1. L3-S1 fusion with pseudoarthrosis findings at L3-4 and L5-S1, as described. 2. L2-3 advanced anterior disc degeneration, the right-sided L3 pedicle screw extends to the disc space. 3. Canal and foraminal patency better assessed on contemporaneous MRI.     Electronically Signed   By: Tiburcio Pea M.D.   On: 07/25/2022 07:53  MRI L spine 07/22/2022 IMPRESSION: 1. Postsurgical changes from L3-S1 posterior spinal fusion and posterior decompression. 2. Unchanged moderate spinal canal stenosis at L2-L3. 3. Moderate to severe left neural foraminal narrowing at L5-S1, slightly progressed compared to prior exam.  Electronically Signed   By: Lorenza Cambridge M.D.   On: 07/24/2022 13:26  I have personally reviewed the images and agree with the above interpretation.  Assessment and Plan: Ms. Lehn is a pleasant 61 y.o. 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  She has kyphosis at L2-3 and acquired scoliosis from this.  She has lumbar sagittal plane imbalance.  There is substantial reactive change in her L2 vertebral body due to screw extrusion into the L2-3 disc base.  This is clearly changed over time.  I think she will likely require revision of her surgery, but she will need to continue her abstinence from nicotine containing products.  Additionally, I have recommended physical therapy.  I will send her for a DEXA study to make sure she does not have bone mineralization issues.  I will see her back in 8 weeks.  I spent a total of 30 minutes in this patient's care today. This time was spent reviewing  pertinent records including imaging studies, obtaining and confirming history, performing a directed evaluation, formulating and discussing my recommendations, and documenting the visit within the medical record.    Thank you for involving me in the care of this patient.      Collyns Mcquigg K. Myer Haff MD, [facility] Neurosurgery

## 2022-08-04 ENCOUNTER — Ambulatory Visit: Payer: BC Managed Care – PPO | Admitting: Neurosurgery

## 2022-08-04 ENCOUNTER — Encounter: Payer: Self-pay | Admitting: Neurosurgery

## 2022-08-04 VITALS — Ht 66.0 in | Wt 228.0 lb

## 2022-08-04 DIAGNOSIS — M40205 Unspecified kyphosis, thoracolumbar region: Secondary | ICD-10-CM | POA: Diagnosis not present

## 2022-08-04 DIAGNOSIS — M438X9 Other specified deforming dorsopathies, site unspecified: Secondary | ICD-10-CM

## 2022-08-04 DIAGNOSIS — M419 Scoliosis, unspecified: Secondary | ICD-10-CM | POA: Diagnosis not present

## 2022-08-04 DIAGNOSIS — S32009K Unspecified fracture of unspecified lumbar vertebra, subsequent encounter for fracture with nonunion: Secondary | ICD-10-CM

## 2022-08-04 DIAGNOSIS — M40209 Unspecified kyphosis, site unspecified: Secondary | ICD-10-CM

## 2022-08-04 DIAGNOSIS — E2839 Other primary ovarian failure: Secondary | ICD-10-CM

## 2022-08-17 ENCOUNTER — Other Ambulatory Visit: Payer: Self-pay | Admitting: Family Medicine

## 2022-08-17 DIAGNOSIS — Z1231 Encounter for screening mammogram for malignant neoplasm of breast: Secondary | ICD-10-CM

## 2022-08-23 DIAGNOSIS — Z8616 Personal history of COVID-19: Secondary | ICD-10-CM

## 2022-08-23 HISTORY — DX: Personal history of COVID-19: Z86.16

## 2022-09-06 ENCOUNTER — Ambulatory Visit
Admission: RE | Admit: 2022-09-06 | Discharge: 2022-09-06 | Disposition: A | Discharge status: Home / Self Care | Payer: BC Managed Care – PPO | Source: Ambulatory Visit | Attending: Family Medicine | Admitting: Family Medicine

## 2022-09-06 DIAGNOSIS — Z1231 Encounter for screening mammogram for malignant neoplasm of breast: Secondary | ICD-10-CM | POA: Diagnosis not present

## 2022-09-29 ENCOUNTER — Encounter: Payer: Self-pay | Admitting: Neurosurgery

## 2022-09-29 ENCOUNTER — Ambulatory Visit: Payer: BC Managed Care – PPO | Admitting: Neurosurgery

## 2022-09-29 VITALS — BP 150/82 | Ht 66.0 in | Wt 225.2 lb

## 2022-09-29 DIAGNOSIS — M438X9 Other specified deforming dorsopathies, site unspecified: Secondary | ICD-10-CM

## 2022-09-29 DIAGNOSIS — T84498A Other mechanical complication of other internal orthopedic devices, implants and grafts, initial encounter: Secondary | ICD-10-CM

## 2022-09-29 DIAGNOSIS — G8929 Other chronic pain: Secondary | ICD-10-CM | POA: Diagnosis not present

## 2022-09-29 DIAGNOSIS — M5442 Lumbago with sciatica, left side: Secondary | ICD-10-CM

## 2022-09-29 DIAGNOSIS — S32009K Unspecified fracture of unspecified lumbar vertebra, subsequent encounter for fracture with nonunion: Secondary | ICD-10-CM

## 2022-09-29 DIAGNOSIS — T84498S Other mechanical complication of other internal orthopedic devices, implants and grafts, sequela: Secondary | ICD-10-CM

## 2022-09-29 NOTE — Patient Instructions (Signed)
Please see below for information in regards to your upcoming surgery:  **we are planning to move our office location mid-March. Please check with [place] to see if we have moved to our new location prior to coming to your post op appointments after surgery. Our phone number will remain the same [phone]). Our new address will be: [facility] [address] [place], [place] [address]   Planned surgery: L2-3 lateral lumbar interbody fusion, L2-S2 posterior spinal fusion, Left L5-S1 foraminotomy, L2-3 posterior column osteotomy   Surgery date: 10/24/22 - you will find out your arrival time the business day before your surgery.   Pre-op appointment at [facility]: we will call you with a date/time for this. Pre-admit testing is located on the first floor of the [facility], [place], [address]. Please bring all prescriptions in the original prescription bottles to your appointment, even if you have reviewed medications by phone with a pharmacy representative. Pre-op labs may be done at your pre-op appointment. You are not required to fast for these labs. Should you need to change your pre-op appointment, please call Pre-admit testing at [phone].    Surgical clearance: we will send a clearance request to [place]: [facility] will contact you regarding an appointment for the brace you will use after surgery. Their number is [phone] should you miss their call or have an issue with your brace after surgery. You will need to bring the brace to the hospital on the day of surgery.   Metformin - should be held for 2 days prior to surgery   NSAIDS (Non-steroidal anti-inflammatory drugs): because you are having a fusion, no NSAIDS (such as ibuprofen, aleve, naproxen, meloxicam, diclofenac) for 3 months after surgery. Celebrex is an exception. Tylenol is ok because it is not an NSAID.   Because you  are having a fusion: for appointments after your 2 week follow-up: please arrive at the [facility] outpatient imaging center ([place], [place]) or [facility] one hour prior to your appointment for x-rays. This applies to every appointment after your 2 week follow-up. Failure to do so may result in your appointment being rescheduled.   If you have FMLA/disability paperwork, please drop it off or fax it to [phone], attention [place].   We can be reached by phone or mychart 8am-4pm, Monday-Friday. If you have any questions/concerns before or after surgery, you can reach [place] at [phone], or you can send a mychart message. If you have a concern after hours that cannot wait until normal business hours, you can call [phone] and ask to page the neurosurgeon on call for [place].     Appointments/FMLA & disability paperwork: [place]  Nurse: Ophelia Shoulder  Medical assistants: Lum Keas Physician Assistant's: [place] Surgeon: Meade Maw, MD

## 2022-09-29 NOTE — H&P (View-Only) (Signed)
  Referring Physician:  No referring provider defined for this encounter.  Primary Physician:  Whitaker, Jason Hestle, PA-C  History of Present Illness: 09/29/2022 Ms. McVay returns to see me.  She continues to have severe back pain.  She has occasional left leg pain as well.  She went to physical therapy but it exacerbated her pain.  Off of nicotine containing products for 2 months.  08/04/2022 Ms. Racquelle Radigan is here today with a chief complaint of low back pain that radiates into the bilateral leg pain that mainly stops at the knee, but will occasionally go into the foot.  She has been having symptoms since October 2023.  Her pain can be as bad as 10 out of 10.  Prolonged sitting and standing make it worse.  She has noticed worsening back pain.  Her pain is at the upper part of her lower back. Bowel/Bladder Dysfunction: none  Conservative measures:  Physical therapy: has not participated in Multimodal medical therapy including regular antiinflammatories:  gabapentin, tylenol Injections:  no recent epidural steroid injections but she has had then after surgery with no relief  Past Surgery:  04/24/18: L3-S1 TLIF by Dr. Cook  Shantal B Landess has no symptoms of cervical myelopathy.  The symptoms are causing a significant impact on the patient's life.    Progress Note from Stacy Luna, PA on 06/24/22: History of Present Illness: 06/24/2022 Ms. Rayelynn Guercio is s/p L3-S1 TLIF on 04/24/2017 by Dr. Cook. He last saw her on 06/23/20. Per his note, the CT scan does showed some lucency around screws which is consistent with a pseudoarthrosis. Given that she has had back pain since her prior surgery, he was concerned she may not get significant relief from additional fusion which would consist of extension to L2 to pelvis. He recommended possible spinal cord stimulator trial as well as referral to pain clinic before considering an additional fusion.    She did not pursue pain  management or SCS  and she had persistent LBP with right leg pain since above surgery.    She did a lot of walking at an event in October and now has constant LBP with left > right leg pain that generally stops at her knees for last 4-6 weeks. Pain is worse with prolonged sitting. No numbness or tingling. She has occasional weakness in both legs.  I have utilized the care everywhere function in epic to review the outside records available from external health systems.  Review of Systems:  A 10 point review of systems is negative, except for the pertinent positives and negatives detailed in the HPI.  Past Medical History: Past Medical History:  Diagnosis Date   Anxiety    Arthritis    osteo generalized   Cancer (HCC) 2012?   thyroid   Diabetes mellitus without complication (HCC)    Gastritis    Headache    migraines   Hyperlipidemia    Hypertension    Hypothyroidism    Personal history of radiation therapy    Sleep apnea    Status post radiation therapy    thyroid    Past Surgical History: Past Surgical History:  Procedure Laterality Date   ABDOMINAL HYSTERECTOMY  2005   COLONOSCOPY  2013?   Dr Skulski   COLONOSCOPY N/A 03/21/2022   Procedure: COLONOSCOPY;  Surgeon: Russo, Steven Michael, DO;  Location: [facility];  Service: Gastroenterology;  Laterality: N/A;   ESOPHAGOGASTRODUODENOSCOPY N/A 03/21/2022   Procedure: ESOPHAGOGASTRODUODENOSCOPY (EGD);  Surgeon: Russo, Steven Michael,   DO;  Location: [facility];  Service: Gastroenterology;  Laterality: N/A;   FORAMINOTOMY 2 LEVEL  07/25/2016   Procedure: FORAMINOTOMY 2 LEVEL;  Surgeon: Steven Cook, MD;  Location: [facility] OR[facility];  Service: Neurosurgery;;  L4-5, L3-4   LUMBAR LAMINECTOMY/DECOMPRESSION MICRODISCECTOMY Left 07/25/2016   Procedure: LUMBAR LAMINECTOMY/DECOMPRESSION MICRODISCECTOMY 2 LEVELS;  Surgeon: Steven Cook, MD;  Location: [facility];  Service: Neurosurgery;  Laterality: Left;  L3-4 hemilaminectomy and  L4-5 full laminectomy    OOPHORECTOMY     THYROIDECTOMY  2012?   Dr Juengel   TONSILLECTOMY     TRANSFORAMINAL LUMBAR INTERBODY FUSION (TLIF) WITH PEDICLE SCREW FIXATION 3 LEVEL Bilateral 04/24/2017   Procedure: TRANSFORAMINAL LUMBAR INTERBODY FUSION (TLIF) WITH PEDICLE SCREW FIXATION 3 LEVEL-L3-S1;  Surgeon: Cook, Steven, MD;  Location: [facility];  Service: Neurosurgery;  Laterality: Bilateral;    Allergies: Allergies as of 09/29/2022 - Review Complete 09/29/2022  Allergen Reaction Noted   Liraglutide Other (See Comments) 01/23/2017   Macrobid [nitrofurantoin] Other (See Comments) 04/18/2017   Morphine and related Other (See Comments) 11/22/2013   Wellbutrin [bupropion] Other (See Comments) 07/19/2016    Medications: Current Meds  Medication Sig   Ascorbic Acid (VITAMIN C) 100 MG CHEW Chew 100 mg by mouth daily.   Blood Glucose Monitoring Suppl (FIFTY50 GLUCOSE METER 2.0) w/Device KIT Check fasting sugar once daily.  E11.9   Calcium 200 MG TABS Take 200 mg by mouth daily.   celecoxib (CELEBREX) 100 MG capsule Take 100 mg by mouth 2 (two) times daily.   citalopram (CELEXA) 10 MG tablet Take 10 mg by mouth daily.   Cyanocobalamin (RA VITAMIN B-12 TR) 1000 MCG TBCR Take by mouth every morning.    fenofibrate (TRICOR) 48 MG tablet Take 48 mg by mouth daily.   gabapentin (NEURONTIN) 300 MG capsule TK ONE C PO  QAM AND 2 CS Q NIGHT and one at lunch.   lisinopril-hydrochlorothiazide (PRINZIDE,ZESTORETIC) 10-12.5 MG tablet TK 1 T PO QD   MAGNESIUM CARBONATE PO Take 1 tablet by mouth daily.   Melatonin 10 MG CAPS Take 10 mg by mouth at bedtime.   metFORMIN (GLUCOPHAGE) 500 MG tablet 500 mg daily with supper.    montelukast (SINGULAIR) 10 MG tablet Take 10 mg by mouth daily.   Multiple Vitamin (MULTI-VITAMINS) TABS Take 1 tablet by mouth daily.    nortriptyline (PAMELOR) 50 MG capsule Take 50 mg by mouth at bedtime.   RABEprazole (ACIPHEX) 20 MG tablet 20 mg in the morning and at bedtime.   rosuvastatin (CRESTOR) 40  MG tablet Take 40 mg by mouth daily.   SYNTHROID 125 MCG tablet daily before breakfast.    traMADol (ULTRAM) 50 MG tablet Take 50 mg by mouth every 6 (six) hours as needed.   Venlafaxine HCl 75 MG TB24 150 mg daily with breakfast.    Vitamin D, Ergocalciferol, 2000 units CAPS Take by mouth.   Zinc Picolinate POWD Take 50 mg by mouth daily.    Social History: Social History   Tobacco Use   Smoking status: Former    Types: Cigarettes    Quit date: 07/15/2022    Years since quitting: 0.2   Smokeless tobacco: Never  Vaping Use   Vaping Use: Never used  Substance Use Topics   Alcohol use: Not Currently    Comment: occ   Drug use: No    Family Medical History: Family History  Problem Relation Age of Onset   Diabetes Mother    Dementia Father    Breast   cancer Neg Hx     Physical Examination: Vitals:   09/29/22 1528  BP: (!) 150/82    General: Patient is well developed, well nourished, calm, collected, and in no apparent distress. Attention to examination is appropriate.  Neck:   Supple.  Full range of motion.  Respiratory: Patient is breathing without any difficulty.   NEUROLOGICAL:     Awake, alert, oriented to person, place, and time.  Speech is clear and fluent. Fund of knowledge is appropriate.   Cranial Nerves: Pupils equal round and reactive to light.  Facial tone is symmetric.  Facial sensation is symmetric. Shoulder shrug is symmetric. Tongue protrusion is midline.  There is no pronator drift.  ROM of spine: limited - pain with standing.    Strength: Side Biceps Triceps Deltoid Interossei Grip Wrist Ext. Wrist Flex.  R 5 5 5 5 5 5 5  L 5 5 5 5 5 5 5   Side Iliopsoas Quads Hamstring PF DF EHL  R 5 5 5 5 5 5  L 5 5 5 5 5 5   Reflexes are 1+ and symmetric at the biceps, triceps, brachioradialis, patella and achilles.   Hoffman's is absent.   Bilateral upper and lower extremity sensation is intact to light touch.    No evidence of dysmetria noted.  Gait  is antalgic.     Medical Decision Making  Imaging: CT L spine 07/22/2022 IMPRESSION: 1. L3-S1 fusion with pseudoarthrosis findings at L3-4 and L5-S1, as described. 2. L2-3 advanced anterior disc degeneration, the right-sided L3 pedicle screw extends to the disc space. 3. Canal and foraminal patency better assessed on contemporaneous MRI.     Electronically Signed   By: Jonathan  Watts M.D.   On: 07/25/2022 07:53  MRI L spine 07/22/2022 IMPRESSION: 1. Postsurgical changes from L3-S1 posterior spinal fusion and posterior decompression. 2. Unchanged moderate spinal canal stenosis at L2-L3. 3. Moderate to severe left neural foraminal narrowing at L5-S1, slightly progressed compared to prior exam.     Electronically Signed   By: Hemant  Desai M.D.   On: 07/24/2022 13:26  I have personally reviewed the images and agree with the above interpretation.  Assessment and Plan: Ms. Luera is a pleasant 61 y.o. 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  She has kyphosis at L2-3 and acquired scoliosis from this.  She has lumbar sagittal plane imbalance.  She has failure of her orthopedic implants at L3 and S1.  There is substantial reactive change in her L2 vertebral body due to screw extrusion into the L2-3 disc space.  This has clearly changed over time.    I think she is very symptomatic from this situation.  She has screw extrusion into the L2-3 disc base, which is quite inflammatory and irritating.  She has failed physical therapy.  I recommended L2-3 lateral lumbar interbody fusion to reestablish her lumbar lordosis at L2-3.  We would then proceed with revision of her fusion to address her pseudoarthrosis at L3-4 and L5-S1.  This would require reinstrumentation from L2-S2.  I would also perform posterior column osteotomy at L2-3 to achieve optimal lumbar lordosis as well as left-sided L5-S1 far lateral foraminotomy for decompression of the L5  nerve root.  I discussed the planned procedure at length with the patient, including the risks, benefits, alternatives, and indications. The risks discussed include but are not limited to bleeding, infection, need for reoperation, spinal fluid leak, stroke, vision loss, anesthetic complication, coma,   paralysis, and even death. I also described in detail that improvement was not guaranteed.  The patient expressed understanding of these risks, and asked that we proceed with surgery. I described the surgery in layman's terms, and gave ample opportunity for questions, which were answered to the best of my ability.  I spent a total of 30 minutes in this patient's care today. This time was spent reviewing pertinent records including imaging studies, obtaining and confirming history, performing a directed evaluation, formulating and discussing my recommendations, and documenting the visit within the medical record.    Thank you for involving me in the care of this patient.      Valisa Karpel K. Brighton Delio MD, MPHS Neurosurgery 

## 2022-09-29 NOTE — Progress Notes (Signed)
Referring Physician:  No referring provider defined for this encounter.  Primary Physician:  Donnamarie Rossetti, PA-C  History of Present Illness: 09/29/2022 Sherri Dickerson returns to see me.  She continues to have severe back pain.  She has occasional left leg pain as well.  She went to physical therapy but it exacerbated her pain.  Off of nicotine containing products for 2 months.  08/04/2022 Sherri Dickerson is here today with a chief complaint of low back pain that radiates into the bilateral leg pain that mainly stops at the knee, but will occasionally go into the foot.  She has been having symptoms since October 2023.  Her pain can be as bad as 10 out of 10.  Prolonged sitting and standing make it worse.  She has noticed worsening back pain.  Her pain is at the upper part of her lower back. Bowel/Bladder Dysfunction: none  Conservative measures:  Physical therapy: has not participated in Multimodal medical therapy including regular antiinflammatories:  gabapentin, tylenol Injections:  no recent epidural steroid injections but she has had then after surgery with no relief  Past Surgery:  04/24/18: L3-S1 TLIF by Dr. Shellia Cleverly B Fedak has no symptoms of cervical myelopathy.  The symptoms are causing a significant impact on the patient's life.    Progress Note from Geronimo Boot, [place] on 06/24/22: History of Present Illness: 06/24/2022 Sherri Dickerson is s/p L3-S1 TLIF on 04/24/2017 by Dr. Lacinda Axon. He last saw her on 06/23/20. Per his note, the CT scan does showed some lucency around screws which is consistent with a pseudoarthrosis. Given that she has had back pain since her prior surgery, he was concerned she may not get significant relief from additional fusion which would consist of extension to L2 to pelvis. He recommended possible spinal cord stimulator trial as well as referral to pain clinic before considering an additional fusion.    She did not pursue pain  management or SCS  and she had persistent LBP with right leg pain since above surgery.    She did a lot of walking at an event in October and now has constant LBP with left > right leg pain that generally stops at her knees for last 4-6 weeks. Pain is worse with prolonged sitting. No numbness or tingling. She has occasional weakness in both legs.  I have utilized the care everywhere function in epic to review the outside records available from external health systems.  Review of Systems:  A 10 point review of systems is negative, except for the pertinent positives and negatives detailed in the HPI.  Past Medical History: Past Medical History:  Diagnosis Date   Anxiety    Arthritis    osteo generalized   Cancer ([place]) 2012?   thyroid   Diabetes mellitus without complication ([place])    Gastritis    Headache    migraines   Hyperlipidemia    Hypertension    Hypothyroidism    Personal history of radiation therapy    Sleep apnea    Status post radiation therapy    thyroid    Past Surgical History: Past Surgical History:  Procedure Laterality Date   ABDOMINAL HYSTERECTOMY  2005   COLONOSCOPY  2013?   Dr Donnella Sham   COLONOSCOPY N/A 03/21/2022   Procedure: COLONOSCOPY;  Surgeon: Annamaria Helling, DO;  Location: [facility] ENDOSCOPY;  Service: Gastroenterology;  Laterality: N/A;   ESOPHAGOGASTRODUODENOSCOPY N/A 03/21/2022   Procedure: ESOPHAGOGASTRODUODENOSCOPY (EGD);  Surgeon: Annamaria Helling,  DO;  Location: [facility] ENDOSCOPY;  Service: Gastroenterology;  Laterality: N/A;   FORAMINOTOMY 2 LEVEL  07/25/2016   Procedure: FORAMINOTOMY 2 LEVEL;  Surgeon: Deetta Perla, MD;  Location: [facility] ORS;  Service: Neurosurgery;;  L4-5, L3-4   LUMBAR LAMINECTOMY/DECOMPRESSION MICRODISCECTOMY Left 07/25/2016   Procedure: LUMBAR LAMINECTOMY/DECOMPRESSION MICRODISCECTOMY 2 LEVELS;  Surgeon: Deetta Perla, MD;  Location: [facility] ORS;  Service: Neurosurgery;  Laterality: Left;  L3-4 hemilaminectomy and  L4-5 full laminectomy    OOPHORECTOMY     THYROIDECTOMY  2012?   Dr Kathyrn Sheriff   TONSILLECTOMY     TRANSFORAMINAL LUMBAR INTERBODY FUSION (TLIF) WITH PEDICLE SCREW FIXATION 3 LEVEL Bilateral 04/24/2017   Procedure: TRANSFORAMINAL LUMBAR INTERBODY FUSION (TLIF) WITH PEDICLE SCREW FIXATION 3 LEVEL-L3-S1;  Surgeon: Deetta Perla, MD;  Location: [facility] ORS;  Service: Neurosurgery;  Laterality: Bilateral;    Allergies: Allergies as of 09/29/2022 - Review Complete 09/29/2022  Allergen Reaction Noted   Liraglutide Other (See Comments) 01/23/2017   Macrobid [nitrofurantoin] Other (See Comments) 04/18/2017   Morphine and related Other (See Comments) 11/22/2013   Wellbutrin [bupropion] Other (See Comments) 07/19/2016    Medications: Current Meds  Medication Sig   Ascorbic Acid (VITAMIN C) 100 MG CHEW Chew 100 mg by mouth daily.   Blood Glucose Monitoring Suppl (FIFTY50 GLUCOSE METER 2.0) w/Device KIT Check fasting sugar once daily.  E11.9   Calcium 200 MG TABS Take 200 mg by mouth daily.   celecoxib (CELEBREX) 100 MG capsule Take 100 mg by mouth 2 (two) times daily.   citalopram (CELEXA) 10 MG tablet Take 10 mg by mouth daily.   Cyanocobalamin (RA VITAMIN B-12 TR) 1000 MCG TBCR Take by mouth every morning.    fenofibrate (TRICOR) 48 MG tablet Take 48 mg by mouth daily.   gabapentin (NEURONTIN) 300 MG capsule TK ONE C PO  QAM AND 2 CS Q NIGHT and one at lunch.   lisinopril-hydrochlorothiazide (PRINZIDE,ZESTORETIC) 10-12.5 MG tablet TK 1 T PO QD   MAGNESIUM CARBONATE PO Take 1 tablet by mouth daily.   Melatonin 10 MG CAPS Take 10 mg by mouth at bedtime.   metFORMIN (GLUCOPHAGE) 500 MG tablet 500 mg daily with supper.    montelukast (SINGULAIR) 10 MG tablet Take 10 mg by mouth daily.   Multiple Vitamin (MULTI-VITAMINS) TABS Take 1 tablet by mouth daily.    nortriptyline (PAMELOR) 50 MG capsule Take 50 mg by mouth at bedtime.   RABEprazole (ACIPHEX) 20 MG tablet 20 mg in the morning and at bedtime.   rosuvastatin (CRESTOR) 40  MG tablet Take 40 mg by mouth daily.   SYNTHROID 125 MCG tablet daily before breakfast.    traMADol (ULTRAM) 50 MG tablet Take 50 mg by mouth every 6 (six) hours as needed.   Venlafaxine HCl 75 MG TB24 150 mg daily with breakfast.    Vitamin D, Ergocalciferol, 2000 units CAPS Take by mouth.   Zinc Picolinate POWD Take 50 mg by mouth daily.    Social History: Social History   Tobacco Use   Smoking status: Former    Types: Cigarettes    Quit date: 07/15/2022    Years since quitting: 0.2   Smokeless tobacco: Never  Vaping Use   Vaping Use: Never used  Substance Use Topics   Alcohol use: Not Currently    Comment: occ   Drug use: No    Family Medical History: Family History  Problem Relation Age of Onset   Diabetes Mother    Dementia Father    Breast  cancer Neg Hx     Physical Examination: Vitals:   09/29/22 1528  BP: (!) 150/82    General: Patient is well developed, well nourished, calm, collected, and in no apparent distress. Attention to examination is appropriate.  Neck:   Supple.  Full range of motion.  Respiratory: Patient is breathing without any difficulty.   NEUROLOGICAL:     Awake, alert, oriented to person, place, and time.  Speech is clear and fluent. Fund of knowledge is appropriate.   Cranial Nerves: Pupils equal round and reactive to light.  Facial tone is symmetric.  Facial sensation is symmetric. Shoulder shrug is symmetric. Tongue protrusion is midline.  There is no pronator drift.  ROM of spine: limited - pain with standing.    Strength: Side Biceps Triceps Deltoid Interossei Grip Wrist Ext. Wrist Flex.  R 5 5 5 5 5 5 5  $ L 5 5 5 5 5 5 5   $ Side Iliopsoas Quads Hamstring PF DF EHL  R 5 5 5 5 5 5  $ L 5 5 5 5 5 5   $ Reflexes are 1+ and symmetric at the biceps, triceps, brachioradialis, patella and achilles.   Hoffman's is absent.   Bilateral upper and lower extremity sensation is intact to light touch.    No evidence of dysmetria noted.  Gait  is antalgic.     Medical Decision Making  Imaging: CT L spine 07/22/2022 IMPRESSION: 1. L3-S1 fusion with pseudoarthrosis findings at L3-4 and L5-S1, as described. 2. L2-3 advanced anterior disc degeneration, the right-sided L3 pedicle screw extends to the disc space. 3. Canal and foraminal patency better assessed on contemporaneous MRI.     Electronically Signed   By: Jorje Guild M.D.   On: 07/25/2022 07:53  MRI L spine 07/22/2022 IMPRESSION: 1. Postsurgical changes from L3-S1 posterior spinal fusion and posterior decompression. 2. Unchanged moderate spinal canal stenosis at L2-L3. 3. Moderate to severe left neural foraminal narrowing at L5-S1, slightly progressed compared to prior exam.     Electronically Signed   By: Marin Roberts M.D.   On: 07/24/2022 13:26  I have personally reviewed the images and agree with the above interpretation.  Assessment and Plan: Sherri Dickerson is a pleasant 62 y.o. 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  She has kyphosis at L2-3 and acquired scoliosis from this.  She has lumbar sagittal plane imbalance.  She has failure of her orthopedic implants at L3 and S1.  There is substantial reactive change in her L2 vertebral body due to screw extrusion into the L2-3 disc space.  This has clearly changed over time.    I think she is very symptomatic from this situation.  She has screw extrusion into the L2-3 disc base, which is quite inflammatory and irritating.  She has failed physical therapy.  I recommended L2-3 lateral lumbar interbody fusion to reestablish her lumbar lordosis at L2-3.  We would then proceed with revision of her fusion to address her pseudoarthrosis at L3-4 and L5-S1.  This would require reinstrumentation from L2-S2.  I would also perform posterior column osteotomy at L2-3 to achieve optimal lumbar lordosis as well as left-sided L5-S1 far lateral foraminotomy for decompression of the L5  nerve root.  I discussed the planned procedure at length with the patient, including the risks, benefits, alternatives, and indications. The risks discussed include but are not limited to bleeding, infection, need for reoperation, spinal fluid leak, stroke, vision loss, anesthetic complication, coma,  paralysis, and even death. I also described in detail that improvement was not guaranteed.  The patient expressed understanding of these risks, and asked that we proceed with surgery. I described the surgery in layman's terms, and gave ample opportunity for questions, which were answered to the best of my ability.  I spent a total of 30 minutes in this patient's care today. This time was spent reviewing pertinent records including imaging studies, obtaining and confirming history, performing a directed evaluation, formulating and discussing my recommendations, and documenting the visit within the medical record.    Thank you for involving me in the care of this patient.      Jamilett Ferrante K. Izora Ribas MD, [facility] Neurosurgery

## 2022-09-30 ENCOUNTER — Other Ambulatory Visit: Payer: Self-pay

## 2022-09-30 DIAGNOSIS — Z01818 Encounter for other preprocedural examination: Secondary | ICD-10-CM

## 2022-10-12 ENCOUNTER — Encounter
Admission: RE | Admit: 2022-10-12 | Discharge: 2022-10-12 | Disposition: A | Discharge status: Home / Self Care | Payer: BC Managed Care – PPO | Source: Ambulatory Visit | Attending: Neurosurgery | Admitting: Neurosurgery

## 2022-10-12 ENCOUNTER — Other Ambulatory Visit: Payer: Self-pay

## 2022-10-12 VITALS — BP 148/91 | HR 95 | Resp 18 | Wt 226.6 lb

## 2022-10-12 DIAGNOSIS — Z01812 Encounter for preprocedural laboratory examination: Secondary | ICD-10-CM

## 2022-10-12 DIAGNOSIS — Z01818 Encounter for other preprocedural examination: Secondary | ICD-10-CM | POA: Insufficient documentation

## 2022-10-12 HISTORY — DX: Family history of other specified conditions: Z84.89

## 2022-10-12 HISTORY — DX: Type 2 diabetes mellitus with diabetic neuropathy, unspecified: E11.40

## 2022-10-12 HISTORY — DX: Gastro-esophageal reflux disease without esophagitis: K21.9

## 2022-10-12 HISTORY — DX: Type 2 diabetes mellitus with diabetic polyneuropathy: E11.42

## 2022-10-12 HISTORY — DX: Malignant neoplasm of thyroid gland: C73

## 2022-10-12 HISTORY — DX: Carpal tunnel syndrome, bilateral upper limbs: G56.03

## 2022-10-12 HISTORY — DX: Migraine, unspecified, not intractable, without status migrainosus: G43.909

## 2022-10-12 LAB — CBC
HCT: 38 % (ref 36.0–46.0)
Hemoglobin: 12.9 g/dL (ref 12.0–15.0)
MCH: 31.5 pg (ref 26.0–34.0)
MCHC: 33.9 g/dL (ref 30.0–36.0)
MCV: 92.9 fL (ref 80.0–100.0)
Platelets: 205 10*3/uL (ref 150–400)
RBC: 4.09 MIL/uL (ref 3.87–5.11)
RDW: 12.6 % (ref 11.5–15.5)
WBC: 6.2 10*3/uL (ref 4.0–10.5)
nRBC: 0 % (ref 0.0–0.2)

## 2022-10-12 LAB — TYPE AND SCREEN
ABO/RH(D): B POS
Antibody Screen: NEGATIVE

## 2022-10-12 LAB — BASIC METABOLIC PANEL
Anion gap: 7 (ref 5–15)
BUN: 12 mg/dL (ref 8–23)
CO2: 30 mmol/L (ref 22–32)
Calcium: 9.3 mg/dL (ref 8.9–10.3)
Chloride: 102 mmol/L (ref 98–111)
Creatinine, Ser: 0.88 mg/dL (ref 0.44–1.00)
GFR, Estimated: >60 mL/min (ref >60)
Glucose, Bld: 111 mg/dL — ABNORMAL HIGH (ref 70–99)
Potassium: 3.5 mmol/L (ref 3.5–5.1)
Sodium: 139 mmol/L (ref 135–145)

## 2022-10-12 LAB — URINALYSIS, ROUTINE W REFLEX MICROSCOPIC
Bilirubin Urine: NEGATIVE
Glucose, UA: NEGATIVE mg/dL
Hgb urine dipstick: NEGATIVE
Ketones, ur: NEGATIVE mg/dL
Leukocytes,Ua: NEGATIVE
Nitrite: NEGATIVE
Protein, ur: NEGATIVE mg/dL
Specific Gravity, Urine: 1.015 (ref 1.005–1.030)
pH: 7 (ref 5.0–8.0)

## 2022-10-12 LAB — SURGICAL PCR SCREEN
MRSA, PCR: NEGATIVE
Staphylococcus aureus: POSITIVE — AB

## 2022-10-12 NOTE — Patient Instructions (Addendum)
Your procedure is scheduled on: 10/24/22 - Monday Report to the Registration Desk on the 1st floor of the [facility]. To find out your arrival time, please call [phone] between 1PM - 3PM on: 10/21/22 - Friday If your arrival time is 6:00 am, do not arrive before that time as the [place] entrance doors do not open until 6:00 am.  REMEMBER: Instructions that are not followed completely may result in serious medical risk, up to and including death; or upon the discretion of your surgeon and anesthesiologist your surgery may need to be rescheduled.  Do not eat food after midnight the night before surgery.  No gum chewing or hard candies.  You may however, drink CLEAR liquids up to 2 hours before you are scheduled to arrive for your surgery. Do not drink anything within 2 hours of your scheduled arrival time.  Clear liquids include: - water   One week prior to surgery: Stop Anti-inflammatories (NSAIDS) such as Advil, Aleve, Ibuprofen, Motrin, Naproxen, Naprosyn and Aspirin based products such as Excedrin, Goody's Powder, BC Powder.  Stop beginning 10/17/22, ANY OVER THE COUNTER supplements until after surgery.  You may however, continue to take Tylenol if needed for pain up until the day of surgery.  Continue taking all prescribed medications with the exception of the following:  1.hold metFORMIN (GLUCOPHAGE) beginning 10/22/22.   TAKE ONLY THESE MEDICATIONS THE MORNING OF SURGERY WITH A SIP OF WATER:  RABEprazole (ACIPHEX)  (take one the night before and one on the morning of surgery - helps to prevent nausea after surgery.) celecoxib (CELEBREX)  3.   SYNTHROID    No Alcohol for 24 hours before or after surgery.  No Smoking including e-cigarettes for 24 hours before surgery.  No chewable tobacco products for at least 6 hours before surgery.  No nicotine patches on the day of surgery.  Do not use any "recreational" drugs for at least a week (preferably 2 weeks)  before your surgery.  Please be advised that the combination of cocaine and anesthesia may have negative outcomes, up to and including death. If you test positive for cocaine, your surgery will be cancelled.  On the morning of surgery brush your teeth with toothpaste and water, you may rinse your mouth with mouthwash if you wish. Do not swallow any toothpaste or mouthwash.  Use CHG Soap or wipes as directed on instruction sheet.  Do not wear jewelry, make-up, hairpins, clips or nail polish.  Do not wear lotions, powders, or perfumes.   Do not shave body hair from the neck down 48 hours before surgery.  Contact lenses, hearing aids and dentures may not be worn into surgery.  Do not bring valuables to the hospital. [facility] is not responsible for any missing/lost belongings or valuables.   Notify your doctor if there is any change in your medical condition (cold, fever, infection).  Wear comfortable clothing (specific to your surgery type) to the hospital.  After surgery, you can help prevent lung complications by doing breathing exercises.  Take deep breaths and cough every 1-2 hours. Your doctor may order a device called an Incentive Spirometer to help you take deep breaths. When coughing or sneezing, hold a pillow firmly against your incision with both hands. This is called "splinting." Doing this helps protect your incision. It also decreases belly discomfort.  If you are being admitted to the hospital overnight, leave your suitcase in the car. After surgery it may be brought to your room.  In  case of increased patient census, it may be necessary for you, the patient, to continue your postoperative care in the Same Day Surgery department.  If you are being discharged the day of surgery, you will not be allowed to drive home. You will need a responsible individual to drive you home and stay with you for 24 hours after surgery.   If you are taking public transportation, you will  need to have a responsible individual with you.  Please call the [place] Dept. at [phone] if you have any questions about these instructions.  Surgery Visitation Policy:  Patients undergoing a surgery or procedure may have two family members or support persons with them as long as the person is not COVID-19 positive or experiencing its symptoms.   Inpatient Visitation:    Visiting hours are 7 a.m. to 8 p.m. Up to four visitors are allowed at one time in a patient room. The visitors may rotate out with other people during the day. One designated support person (adult) may remain overnight.  Due to an increase in RSV and influenza rates and associated hospitalizations, children ages 7 and under will not be able to visit patients in [facility] Masks continue to be strongly recommended.    Preparing for Surgery with CHLORHEXIDINE GLUCONATE (CHG) Soap  Chlorhexidine Gluconate (CHG) Soap  o An antiseptic cleaner that kills germs and bonds with the skin to continue killing germs even after washing  o Used for showering the night before surgery and morning of surgery  Before surgery, you can play an important role by reducing the number of germs on your skin.  CHG (Chlorhexidine gluconate) soap is an antiseptic cleanser which kills germs and bonds with the skin to continue killing germs even after washing.  Please do not use if you have an allergy to CHG or antibacterial soaps. If your skin becomes reddened/irritated stop using the CHG.  1. Shower the NIGHT BEFORE SURGERY and the MORNING OF SURGERY with CHG soap.  2. If you choose to wash your hair, wash your hair first as usual with your normal shampoo.  3. After shampooing, rinse your hair and body thoroughly to remove the shampoo.  4. Use CHG as you would any other liquid soap. You can apply CHG directly to the skin and wash gently with a scrungie or a clean washcloth.  5. Apply the CHG soap to  your body only from the neck down. Do not use on open wounds or open sores. Avoid contact with your eyes, ears, mouth, and genitals (private parts). Wash face and genitals (private parts) with your normal soap.  6. Wash thoroughly, paying special attention to the area where your surgery will be performed.  7. Thoroughly rinse your body with warm water.  8. Do not shower/wash with your normal soap after using and rinsing off the CHG soap.  9. Pat yourself dry with a clean towel.  10. Wear clean pajamas to bed the night before surgery.  12. Place clean sheets on your bed the night of your first shower and do not sleep with pets.  13. Shower again with the CHG soap on the day of surgery prior to arriving at the hospital.  14. Do not apply any deodorants/lotions/powders.  15. Please wear clean clothes to the hospital.

## 2022-10-13 ENCOUNTER — Ambulatory Visit: Payer: BC Managed Care – PPO | Admitting: Neurosurgery

## 2022-10-13 LAB — HEMOGLOBIN A1C
Hgb A1c MFr Bld: 6.4 % — ABNORMAL HIGH (ref 4.8–5.6)
Mean Plasma Glucose: 137 mg/dL

## 2022-10-24 ENCOUNTER — Other Ambulatory Visit: Payer: Self-pay

## 2022-10-24 ENCOUNTER — Encounter: Payer: Self-pay | Admitting: Neurosurgery

## 2022-10-24 ENCOUNTER — Inpatient Hospital Stay: Payer: BC Managed Care – PPO

## 2022-10-24 ENCOUNTER — Inpatient Hospital Stay
Admission: RE | Admit: 2022-10-24 | Discharge: 2022-10-27 | DRG: 455 | Disposition: A | Discharge status: Home / Self Care | Payer: BC Managed Care – PPO | Attending: Neurosurgery | Admitting: Neurosurgery

## 2022-10-24 ENCOUNTER — Encounter
Admission: RE | Disposition: A | Discharge status: Home / Self Care | Payer: Self-pay | Source: Home / Self Care | Attending: Neurosurgery

## 2022-10-24 ENCOUNTER — Inpatient Hospital Stay: Payer: BC Managed Care – PPO | Admitting: Urgent Care

## 2022-10-24 ENCOUNTER — Inpatient Hospital Stay: Payer: BC Managed Care – PPO | Admitting: Registered Nurse

## 2022-10-24 DIAGNOSIS — Z79890 Hormone replacement therapy (postmenopausal): Secondary | ICD-10-CM | POA: Diagnosis not present

## 2022-10-24 DIAGNOSIS — Z833 Family history of diabetes mellitus: Secondary | ICD-10-CM

## 2022-10-24 DIAGNOSIS — Z79899 Other long term (current) drug therapy: Secondary | ICD-10-CM | POA: Diagnosis not present

## 2022-10-24 DIAGNOSIS — Z881 Allergy status to other antibiotic agents status: Secondary | ICD-10-CM | POA: Diagnosis not present

## 2022-10-24 DIAGNOSIS — E119 Type 2 diabetes mellitus without complications: Secondary | ICD-10-CM | POA: Diagnosis present

## 2022-10-24 DIAGNOSIS — Z87891 Personal history of nicotine dependence: Secondary | ICD-10-CM

## 2022-10-24 DIAGNOSIS — G4730 Sleep apnea, unspecified: Secondary | ICD-10-CM | POA: Diagnosis present

## 2022-10-24 DIAGNOSIS — M438X9 Other specified deforming dorsopathies, site unspecified: Secondary | ICD-10-CM

## 2022-10-24 DIAGNOSIS — M532X6 Spinal instabilities, lumbar region: Secondary | ICD-10-CM | POA: Diagnosis present

## 2022-10-24 DIAGNOSIS — Z923 Personal history of irradiation: Secondary | ICD-10-CM | POA: Diagnosis not present

## 2022-10-24 DIAGNOSIS — Z820 Family history of epilepsy and other diseases of the nervous system: Secondary | ICD-10-CM | POA: Diagnosis not present

## 2022-10-24 DIAGNOSIS — M159 Polyosteoarthritis, unspecified: Secondary | ICD-10-CM | POA: Diagnosis present

## 2022-10-24 DIAGNOSIS — M48061 Spinal stenosis, lumbar region without neurogenic claudication: Secondary | ICD-10-CM | POA: Diagnosis present

## 2022-10-24 DIAGNOSIS — Z888 Allergy status to other drugs, medicaments and biological substances status: Secondary | ICD-10-CM | POA: Diagnosis not present

## 2022-10-24 DIAGNOSIS — M960 Pseudarthrosis after fusion or arthrodesis: Secondary | ICD-10-CM

## 2022-10-24 DIAGNOSIS — I10 Essential (primary) hypertension: Secondary | ICD-10-CM | POA: Diagnosis present

## 2022-10-24 DIAGNOSIS — M5116 Intervertebral disc disorders with radiculopathy, lumbar region: Secondary | ICD-10-CM | POA: Diagnosis present

## 2022-10-24 DIAGNOSIS — E039 Hypothyroidism, unspecified: Secondary | ICD-10-CM | POA: Diagnosis present

## 2022-10-24 DIAGNOSIS — M5442 Lumbago with sciatica, left side: Secondary | ICD-10-CM

## 2022-10-24 DIAGNOSIS — E785 Hyperlipidemia, unspecified: Secondary | ICD-10-CM | POA: Diagnosis present

## 2022-10-24 DIAGNOSIS — G8929 Other chronic pain: Secondary | ICD-10-CM

## 2022-10-24 DIAGNOSIS — M419 Scoliosis, unspecified: Secondary | ICD-10-CM | POA: Diagnosis present

## 2022-10-24 DIAGNOSIS — Z7984 Long term (current) use of oral hypoglycemic drugs: Secondary | ICD-10-CM | POA: Diagnosis not present

## 2022-10-24 DIAGNOSIS — Z01818 Encounter for other preprocedural examination: Secondary | ICD-10-CM

## 2022-10-24 DIAGNOSIS — T84498D Other mechanical complication of other internal orthopedic devices, implants and grafts, subsequent encounter: Secondary | ICD-10-CM

## 2022-10-24 DIAGNOSIS — Z885 Allergy status to narcotic agent status: Secondary | ICD-10-CM | POA: Diagnosis not present

## 2022-10-24 DIAGNOSIS — Z01812 Encounter for preprocedural laboratory examination: Secondary | ICD-10-CM

## 2022-10-24 DIAGNOSIS — Y838 Other surgical procedures as the cause of abnormal reaction of the patient, or of later complication, without mention of misadventure at the time of the procedure: Secondary | ICD-10-CM | POA: Diagnosis present

## 2022-10-24 DIAGNOSIS — T84498A Other mechanical complication of other internal orthopedic devices, implants and grafts, initial encounter: Secondary | ICD-10-CM

## 2022-10-24 DIAGNOSIS — Z981 Arthrodesis status: Principal | ICD-10-CM

## 2022-10-24 HISTORY — PX: APPLICATION OF INTRAOPERATIVE CT SCAN: SHX6668

## 2022-10-24 HISTORY — PX: ANTERIOR LUMBAR FUSION: SHX1170

## 2022-10-24 HISTORY — PX: FORAMINOTOMY 1 LEVEL: SHX5835

## 2022-10-24 HISTORY — PX: POSTERIOR LUMBAR FUSION 4 LEVEL: SHX6037

## 2022-10-24 LAB — GLUCOSE, CAPILLARY
Glucose-Capillary: 118 mg/dL — ABNORMAL HIGH (ref 70–99)
Glucose-Capillary: 196 mg/dL — ABNORMAL HIGH (ref 70–99)
Glucose-Capillary: 218 mg/dL — ABNORMAL HIGH (ref 70–99)
Glucose-Capillary: 164 mg/dL — ABNORMAL HIGH (ref 70–99)

## 2022-10-24 SURGERY — ANTERIOR LUMBAR FUSION 1 LEVEL
Anesthesia: General | Site: Spine Lumbar

## 2022-10-24 MED ORDER — SURGIRINSE WOUND IRRIGATION SYSTEM - OPTIME
TOPICAL | Status: DC | PRN
  Administered 2022-10-24: 450 mL via TOPICAL

## 2022-10-24 MED ORDER — MIDAZOLAM HCL 2 MG/2ML IJ SOLN
INTRAMUSCULAR | Status: DC | PRN
  Administered 2022-10-24: 2 mg via INTRAVENOUS

## 2022-10-24 MED ORDER — EPHEDRINE 5 MG/ML INJ
INTRAVENOUS | Status: AC
  Filled 2022-10-24: qty 5

## 2022-10-24 MED ORDER — PROPOFOL 1000 MG/100ML IV EMUL
INTRAVENOUS | Status: AC
  Filled 2022-10-24: qty 100

## 2022-10-24 MED ORDER — CEFAZOLIN IN SODIUM CHLORIDE 2-0.9 GM/100ML-% IV SOLN
2.0000 g | Freq: Once | INTRAVENOUS | Status: DC
  Filled 2022-10-24: qty 100

## 2022-10-24 MED ORDER — POLYETHYLENE GLYCOL 3350 17 G PO PACK
17.0000 g | PACK | Freq: Every day | ORAL | Status: DC | PRN
  Administered 2022-10-26: 17 g via ORAL
  Filled 2022-10-24: qty 1

## 2022-10-24 MED ORDER — REMIFENTANIL HCL 1 MG IV SOLR
INTRAVENOUS | Status: AC
  Filled 2022-10-24: qty 1000

## 2022-10-24 MED ORDER — OXYCODONE HCL 5 MG PO TABS
10.0000 mg | ORAL_TABLET | ORAL | Status: DC | PRN
  Administered 2022-10-24 – 2022-10-27 (×5): 10 mg via ORAL
  Filled 2022-10-24 (×5): qty 2

## 2022-10-24 MED ORDER — HYDROMORPHONE HCL 1 MG/ML IJ SOLN
INTRAMUSCULAR | Status: DC | PRN
  Administered 2022-10-24 (×2): .25 mg via INTRAVENOUS

## 2022-10-24 MED ORDER — SURGIFLO WITH THROMBIN (HEMOSTATIC MATRIX KIT) OPTIME
TOPICAL | Status: DC | PRN
  Administered 2022-10-24: 2

## 2022-10-24 MED ORDER — HYDROMORPHONE HCL 1 MG/ML IJ SOLN
INTRAMUSCULAR | Status: AC
  Filled 2022-10-24: qty 1

## 2022-10-24 MED ORDER — PHENYLEPHRINE HCL-NACL 20-0.9 MG/250ML-% IV SOLN
INTRAVENOUS | Status: AC
  Filled 2022-10-24: qty 250

## 2022-10-24 MED ORDER — ONDANSETRON HCL 4 MG/2ML IJ SOLN: 4.0000 mg | Freq: Once | INTRAMUSCULAR | Status: DC | PRN

## 2022-10-24 MED ORDER — DEXAMETHASONE SODIUM PHOSPHATE 10 MG/ML IJ SOLN
INTRAMUSCULAR | Status: DC | PRN
  Administered 2022-10-24: 8 mg via INTRAVENOUS

## 2022-10-24 MED ORDER — LIDOCAINE HCL (PF) 2 % IJ SOLN
INTRAMUSCULAR | Status: AC
  Filled 2022-10-24: qty 5

## 2022-10-24 MED ORDER — VANCOMYCIN HCL IN DEXTROSE 1-5 GM/200ML-% IV SOLN
INTRAVENOUS | Status: AC
  Administered 2022-10-24: 1000 mg via INTRAVENOUS
  Filled 2022-10-24: qty 200

## 2022-10-24 MED ORDER — INSULIN ASPART 100 UNIT/ML IJ SOLN: 0.0000 [IU] | Freq: Every day | INTRAMUSCULAR | Status: DC

## 2022-10-24 MED ORDER — CITALOPRAM HYDROBROMIDE 10 MG PO TABS
20.0000 mg | ORAL_TABLET | Freq: Every day | ORAL | Status: DC
  Administered 2022-10-24 – 2022-10-27 (×4): 20 mg via ORAL
  Filled 2022-10-24 (×4): qty 2

## 2022-10-24 MED ORDER — CEFAZOLIN SODIUM-DEXTROSE 2-4 GM/100ML-% IV SOLN
2.0000 g | INTRAVENOUS | Status: AC
  Administered 2022-10-24: 2 g via INTRAVENOUS

## 2022-10-24 MED ORDER — INSULIN ASPART 100 UNIT/ML IJ SOLN
0.0000 [IU] | Freq: Three times a day (TID) | INTRAMUSCULAR | Status: DC
  Administered 2022-10-24: 5 [IU] via SUBCUTANEOUS
  Administered 2022-10-25 – 2022-10-26 (×3): 2 [IU] via SUBCUTANEOUS
  Filled 2022-10-24 (×4): qty 1

## 2022-10-24 MED ORDER — VANCOMYCIN HCL IN DEXTROSE 1-5 GM/200ML-% IV SOLN: 1000.0000 mg | Freq: Once | INTRAVENOUS | Status: AC

## 2022-10-24 MED ORDER — SODIUM CHLORIDE 0.9 % IV SOLN: INTRAVENOUS | Status: DC

## 2022-10-24 MED ORDER — FENOFIBRATE 54 MG PO TABS
54.0000 mg | ORAL_TABLET | Freq: Every day | ORAL | Status: DC
  Administered 2022-10-24 – 2022-10-27 (×4): 54 mg via ORAL
  Filled 2022-10-24 (×4): qty 1

## 2022-10-24 MED ORDER — LISINOPRIL-HYDROCHLOROTHIAZIDE 10-12.5 MG PO TABS: 1.0000 | ORAL_TABLET | Freq: Every day | ORAL | Status: DC

## 2022-10-24 MED ORDER — KETOROLAC TROMETHAMINE 15 MG/ML IJ SOLN
15.0000 mg | Freq: Four times a day (QID) | INTRAMUSCULAR | Status: AC
  Administered 2022-10-24 – 2022-10-25 (×4): 15 mg via INTRAVENOUS
  Filled 2022-10-24 (×4): qty 1

## 2022-10-24 MED ORDER — PHENYLEPHRINE HCL-NACL 20-0.9 MG/250ML-% IV SOLN
INTRAVENOUS | Status: DC | PRN
  Administered 2022-10-24: 25 ug/min via INTRAVENOUS

## 2022-10-24 MED ORDER — 0.9 % SODIUM CHLORIDE (POUR BTL) OPTIME
TOPICAL | Status: DC | PRN
  Administered 2022-10-24: 500 mL

## 2022-10-24 MED ORDER — VENLAFAXINE HCL ER 75 MG PO CP24
150.0000 mg | ORAL_CAPSULE | Freq: Every day | ORAL | Status: DC
  Administered 2022-10-25 – 2022-10-27 (×3): 150 mg via ORAL
  Filled 2022-10-24 (×3): qty 2

## 2022-10-24 MED ORDER — ALBUMIN HUMAN 5 % IV SOLN
INTRAVENOUS | Status: AC
  Filled 2022-10-24: qty 250

## 2022-10-24 MED ORDER — SODIUM CHLORIDE 0.9% FLUSH
3.0000 mL | INTRAVENOUS | Status: DC | PRN
  Administered 2022-10-24: 3 mL via INTRAVENOUS

## 2022-10-24 MED ORDER — ALBUMIN HUMAN 25 % IV SOLN
12.5000 g | Freq: Once | INTRAVENOUS | Status: AC
  Administered 2022-10-24: 12.5 g via INTRAVENOUS

## 2022-10-24 MED ORDER — REMIFENTANIL HCL 1 MG IV SOLR
INTRAVENOUS | Status: DC | PRN
  Administered 2022-10-24: .15 ug/kg/min via INTRAVENOUS

## 2022-10-24 MED ORDER — BUPIVACAINE-EPINEPHRINE (PF) 0.5% -1:200000 IJ SOLN
INTRAMUSCULAR | Status: DC | PRN
  Administered 2022-10-24: 10 mL via PERINEURAL
  Administered 2022-10-24: 2 mL via PERINEURAL

## 2022-10-24 MED ORDER — VANCOMYCIN HCL 1000 MG IV SOLR
INTRAVENOUS | Status: DC | PRN
  Administered 2022-10-24: 1000 mg

## 2022-10-24 MED ORDER — NORTRIPTYLINE HCL 25 MG PO CAPS
50.0000 mg | ORAL_CAPSULE | Freq: Every day | ORAL | Status: DC
  Administered 2022-10-24 – 2022-10-26 (×3): 50 mg via ORAL
  Filled 2022-10-24 (×3): qty 2

## 2022-10-24 MED ORDER — MENTHOL 3 MG MT LOZG: 1.0000 | LOZENGE | OROMUCOSAL | Status: DC | PRN

## 2022-10-24 MED ORDER — PHENYLEPHRINE HCL (PRESSORS) 10 MG/ML IV SOLN
INTRAVENOUS | Status: DC | PRN
  Administered 2022-10-24 (×2): 240 ug via INTRAVENOUS
  Administered 2022-10-24: 200 ug via INTRAVENOUS
  Administered 2022-10-24: 80 ug via INTRAVENOUS
  Administered 2022-10-24 (×2): 160 ug via INTRAVENOUS
  Administered 2022-10-24: 120 ug via INTRAVENOUS
  Administered 2022-10-24: 240 ug via INTRAVENOUS
  Administered 2022-10-24: 200 ug via INTRAVENOUS

## 2022-10-24 MED ORDER — FENTANYL CITRATE (PF) 100 MCG/2ML IJ SOLN
INTRAMUSCULAR | Status: AC
  Administered 2022-10-24: 25 ug via INTRAVENOUS
  Filled 2022-10-24: qty 2

## 2022-10-24 MED ORDER — BUPIVACAINE LIPOSOME 1.3 % IJ SUSP
INTRAMUSCULAR | Status: AC
  Filled 2022-10-24: qty 20

## 2022-10-24 MED ORDER — KETAMINE HCL 10 MG/ML IJ SOLN
INTRAMUSCULAR | Status: DC | PRN
  Administered 2022-10-24 (×2): 10 mg via INTRAVENOUS
  Administered 2022-10-24 (×3): 20 mg via INTRAVENOUS

## 2022-10-24 MED ORDER — LIDOCAINE HCL (CARDIAC) PF 100 MG/5ML IV SOSY
PREFILLED_SYRINGE | INTRAVENOUS | Status: DC | PRN
  Administered 2022-10-24: 100 mg via INTRAVENOUS

## 2022-10-24 MED ORDER — BUPIVACAINE HCL (PF) 0.5 % IJ SOLN
INTRAMUSCULAR | Status: AC
  Filled 2022-10-24: qty 60

## 2022-10-24 MED ORDER — SODIUM CHLORIDE FLUSH 0.9 % IV SOLN
INTRAVENOUS | Status: AC
  Filled 2022-10-24: qty 20

## 2022-10-24 MED ORDER — MIDAZOLAM HCL 2 MG/2ML IJ SOLN
INTRAMUSCULAR | Status: AC
  Filled 2022-10-24: qty 2

## 2022-10-24 MED ORDER — GABAPENTIN 300 MG PO CAPS
600.0000 mg | ORAL_CAPSULE | Freq: Every day | ORAL | Status: DC
  Administered 2022-10-24 – 2022-10-26 (×3): 600 mg via ORAL
  Filled 2022-10-24 (×3): qty 2

## 2022-10-24 MED ORDER — CHLORHEXIDINE GLUCONATE 0.12 % MT SOLN: 15.0000 mL | Freq: Once | OROMUCOSAL | Status: AC

## 2022-10-24 MED ORDER — ACETAMINOPHEN 10 MG/ML IV SOLN
INTRAVENOUS | Status: AC
  Filled 2022-10-24: qty 100

## 2022-10-24 MED ORDER — ALBUMIN HUMAN 5 % IV SOLN: 12.5000 g | Freq: Once | INTRAVENOUS | Status: AC

## 2022-10-24 MED ORDER — PHENYLEPHRINE 80 MCG/ML (10ML) SYRINGE FOR IV PUSH (FOR BLOOD PRESSURE SUPPORT)
PREFILLED_SYRINGE | INTRAVENOUS | Status: AC
  Filled 2022-10-24: qty 10

## 2022-10-24 MED ORDER — EPHEDRINE SULFATE (PRESSORS) 50 MG/ML IJ SOLN
INTRAMUSCULAR | Status: AC
  Administered 2022-10-24: 10 mg via INTRAVENOUS
  Filled 2022-10-24: qty 1

## 2022-10-24 MED ORDER — ALBUMIN HUMAN 5 % IV SOLN
INTRAVENOUS | Status: AC
  Administered 2022-10-24: 12.5 g via INTRAVENOUS
  Filled 2022-10-24: qty 250

## 2022-10-24 MED ORDER — HYDROCHLOROTHIAZIDE 12.5 MG PO TABS
12.5000 mg | ORAL_TABLET | Freq: Every day | ORAL | Status: DC
  Administered 2022-10-24 – 2022-10-27 (×4): 12.5 mg via ORAL
  Filled 2022-10-24 (×4): qty 1

## 2022-10-24 MED ORDER — CHLORHEXIDINE GLUCONATE 0.12 % MT SOLN
OROMUCOSAL | Status: AC
  Administered 2022-10-24: 15 mL via OROMUCOSAL
  Filled 2022-10-24: qty 15

## 2022-10-24 MED ORDER — TRAZODONE HCL 100 MG PO TABS
100.0000 mg | ORAL_TABLET | Freq: Every day | ORAL | Status: DC
  Administered 2022-10-24 – 2022-10-26 (×3): 100 mg via ORAL
  Filled 2022-10-24 (×3): qty 1

## 2022-10-24 MED ORDER — MAGNESIUM CITRATE PO SOLN: 1.0000 | Freq: Once | ORAL | Status: DC | PRN

## 2022-10-24 MED ORDER — ACETAMINOPHEN 500 MG PO TABS
1000.0000 mg | ORAL_TABLET | Freq: Four times a day (QID) | ORAL | Status: AC
  Administered 2022-10-24 – 2022-10-25 (×4): 1000 mg via ORAL
  Filled 2022-10-24 (×4): qty 2

## 2022-10-24 MED ORDER — SUCCINYLCHOLINE CHLORIDE 200 MG/10ML IV SOSY
PREFILLED_SYRINGE | INTRAVENOUS | Status: DC | PRN
  Administered 2022-10-24: 120 mg via INTRAVENOUS

## 2022-10-24 MED ORDER — METHOCARBAMOL 500 MG PO TABS
500.0000 mg | ORAL_TABLET | Freq: Four times a day (QID) | ORAL | Status: DC | PRN
  Administered 2022-10-24 – 2022-10-26 (×2): 500 mg via ORAL
  Filled 2022-10-24 (×2): qty 1

## 2022-10-24 MED ORDER — SENNA 8.6 MG PO TABS
1.0000 | ORAL_TABLET | Freq: Two times a day (BID) | ORAL | Status: DC
  Administered 2022-10-24 – 2022-10-27 (×6): 8.6 mg via ORAL
  Filled 2022-10-24 (×6): qty 1

## 2022-10-24 MED ORDER — SUCCINYLCHOLINE CHLORIDE 200 MG/10ML IV SOSY
PREFILLED_SYRINGE | INTRAVENOUS | Status: AC
  Filled 2022-10-24: qty 10

## 2022-10-24 MED ORDER — ONDANSETRON HCL 4 MG/2ML IJ SOLN: 4.0000 mg | Freq: Four times a day (QID) | INTRAMUSCULAR | Status: DC | PRN

## 2022-10-24 MED ORDER — ROSUVASTATIN CALCIUM 10 MG PO TABS
40.0000 mg | ORAL_TABLET | Freq: Every day | ORAL | Status: DC
  Administered 2022-10-24 – 2022-10-26 (×3): 40 mg via ORAL
  Filled 2022-10-24 (×4): qty 4

## 2022-10-24 MED ORDER — PROPOFOL 10 MG/ML IV BOLUS
INTRAVENOUS | Status: DC | PRN
  Administered 2022-10-24: 180 mg via INTRAVENOUS
  Administered 2022-10-24: 50 mg via INTRAVENOUS
  Administered 2022-10-24 (×2): 100 mg via INTRAVENOUS

## 2022-10-24 MED ORDER — SODIUM CHLORIDE 0.9% FLUSH
3.0000 mL | Freq: Two times a day (BID) | INTRAVENOUS | Status: DC
  Administered 2022-10-24 – 2022-10-26 (×5): 3 mL via INTRAVENOUS

## 2022-10-24 MED ORDER — DOCUSATE SODIUM 100 MG PO CAPS
100.0000 mg | ORAL_CAPSULE | Freq: Two times a day (BID) | ORAL | Status: DC
  Administered 2022-10-24 – 2022-10-27 (×6): 100 mg via ORAL
  Filled 2022-10-24 (×6): qty 1

## 2022-10-24 MED ORDER — EPHEDRINE SULFATE (PRESSORS) 50 MG/ML IJ SOLN
10.0000 mg | Freq: Once | INTRAMUSCULAR | Status: AC
  Administered 2022-10-24: 10 mg via INTRAVENOUS

## 2022-10-24 MED ORDER — OXYCODONE HCL 5 MG PO TABS
5.0000 mg | ORAL_TABLET | ORAL | Status: DC | PRN
  Administered 2022-10-24 – 2022-10-26 (×2): 5 mg via ORAL
  Filled 2022-10-24 (×2): qty 1

## 2022-10-24 MED ORDER — ACETAMINOPHEN 10 MG/ML IV SOLN
INTRAVENOUS | Status: DC | PRN
  Administered 2022-10-24: 1000 mg via INTRAVENOUS

## 2022-10-24 MED ORDER — ORAL CARE MOUTH RINSE: 15.0000 mL | Freq: Once | OROMUCOSAL | Status: AC

## 2022-10-24 MED ORDER — PANTOPRAZOLE SODIUM 40 MG PO TBEC
40.0000 mg | DELAYED_RELEASE_TABLET | Freq: Every day | ORAL | Status: DC
  Administered 2022-10-25 – 2022-10-27 (×3): 40 mg via ORAL
  Filled 2022-10-24 (×3): qty 1

## 2022-10-24 MED ORDER — METHOCARBAMOL 1000 MG/10ML IJ SOLN
500.0000 mg | Freq: Four times a day (QID) | INTRAVENOUS | Status: DC | PRN
  Filled 2022-10-24: qty 5

## 2022-10-24 MED ORDER — SODIUM CHLORIDE 0.9 % IV SOLN: 250.0000 mL | INTRAVENOUS | Status: DC

## 2022-10-24 MED ORDER — ORAL CARE MOUTH RINSE: 15.0000 mL | OROMUCOSAL | Status: DC | PRN

## 2022-10-24 MED ORDER — FENTANYL CITRATE (PF) 100 MCG/2ML IJ SOLN
25.0000 ug | INTRAMUSCULAR | Status: DC | PRN
  Administered 2022-10-24 (×3): 25 ug via INTRAVENOUS

## 2022-10-24 MED ORDER — LEVOTHYROXINE SODIUM 137 MCG PO TABS
137.0000 ug | ORAL_TABLET | Freq: Every day | ORAL | Status: DC
  Administered 2022-10-25 – 2022-10-27 (×3): 137 ug via ORAL
  Filled 2022-10-24 (×3): qty 1

## 2022-10-24 MED ORDER — ONDANSETRON HCL 4 MG PO TABS: 4.0000 mg | ORAL_TABLET | Freq: Four times a day (QID) | ORAL | Status: DC | PRN

## 2022-10-24 MED ORDER — SODIUM CHLORIDE (PF) 0.9 % IJ SOLN
INTRAMUSCULAR | Status: DC | PRN
  Administered 2022-10-24: 60 mL via INTRAMUSCULAR

## 2022-10-24 MED ORDER — EPHEDRINE SULFATE (PRESSORS) 50 MG/ML IJ SOLN
INTRAMUSCULAR | Status: DC | PRN
  Administered 2022-10-24: 5 mg via INTRAVENOUS
  Administered 2022-10-24: 10 mg via INTRAVENOUS
  Administered 2022-10-24 (×2): 5 mg via INTRAVENOUS
  Administered 2022-10-24: 12.5 mg via INTRAVENOUS
  Administered 2022-10-24: 10 mg via INTRAVENOUS
  Administered 2022-10-24: 5 mg via INTRAVENOUS
  Administered 2022-10-24: 15 mg via INTRAVENOUS
  Administered 2022-10-24: 2.5 mg via INTRAVENOUS
  Administered 2022-10-24: 10 mg via INTRAVENOUS
  Administered 2022-10-24 (×2): 5 mg via INTRAVENOUS
  Administered 2022-10-24: 7.5 mg via INTRAVENOUS

## 2022-10-24 MED ORDER — FENTANYL CITRATE (PF) 100 MCG/2ML IJ SOLN
INTRAMUSCULAR | Status: DC | PRN
  Administered 2022-10-24: 100 ug via INTRAVENOUS

## 2022-10-24 MED ORDER — LISINOPRIL 10 MG PO TABS
10.0000 mg | ORAL_TABLET | Freq: Every day | ORAL | Status: DC
  Administered 2022-10-24 – 2022-10-27 (×4): 10 mg via ORAL
  Filled 2022-10-24 (×4): qty 1

## 2022-10-24 MED ORDER — FENTANYL CITRATE (PF) 100 MCG/2ML IJ SOLN
INTRAMUSCULAR | Status: AC
  Filled 2022-10-24: qty 2

## 2022-10-24 MED ORDER — KETAMINE HCL 50 MG/5ML IJ SOSY
PREFILLED_SYRINGE | INTRAMUSCULAR | Status: AC
  Filled 2022-10-24: qty 5

## 2022-10-24 MED ORDER — VANCOMYCIN HCL 1000 MG IV SOLR
INTRAVENOUS | Status: AC
  Filled 2022-10-24: qty 20

## 2022-10-24 MED ORDER — EPINEPHRINE PF 1 MG/ML IJ SOLN
INTRAMUSCULAR | Status: AC
  Filled 2022-10-24: qty 1

## 2022-10-24 MED ORDER — PHENOL 1.4 % MT LIQD: 1.0000 | OROMUCOSAL | Status: DC | PRN

## 2022-10-24 MED ORDER — BISACODYL 10 MG RE SUPP: 10.0000 mg | Freq: Every day | RECTAL | Status: DC | PRN

## 2022-10-24 MED ORDER — CEFAZOLIN SODIUM-DEXTROSE 2-4 GM/100ML-% IV SOLN
INTRAVENOUS | Status: AC
  Filled 2022-10-24: qty 100

## 2022-10-24 MED ORDER — ONDANSETRON HCL 4 MG/2ML IJ SOLN
INTRAMUSCULAR | Status: DC | PRN
  Administered 2022-10-24 (×2): 4 mg via INTRAVENOUS

## 2022-10-24 MED ORDER — PROPOFOL 500 MG/50ML IV EMUL
INTRAVENOUS | Status: DC | PRN
  Administered 2022-10-24: 175 ug/kg/min via INTRAVENOUS

## 2022-10-24 MED ORDER — ENOXAPARIN SODIUM 40 MG/0.4ML IJ SOSY
40.0000 mg | PREFILLED_SYRINGE | INTRAMUSCULAR | Status: DC
  Administered 2022-10-25 – 2022-10-27 (×3): 40 mg via SUBCUTANEOUS
  Filled 2022-10-24 (×3): qty 0.4

## 2022-10-24 MED ORDER — ALBUMIN HUMAN 25 % IV SOLN
INTRAVENOUS | Status: AC
  Filled 2022-10-24: qty 50

## 2022-10-24 SURGICAL SUPPLY — 83 items
ADH SKN CLS APL DERMABOND .7 (GAUZE/BANDAGES/DRESSINGS) ×3
AGENT HMST KT MTR STRL THRMB (HEMOSTASIS) ×6
ALLOGRAFT BONE FIBER KORE 10CC (Bone Implant) IMPLANT
ALLOGRAFT BONESTRIP KORE 2.5X5 (Bone Implant) IMPLANT
APL PRP STRL LF DISP 70% ISPRP (MISCELLANEOUS) ×6
BASIN KIT SINGLE STR (MISCELLANEOUS) ×3 IMPLANT
BUR NEURO DRILL SOFT 3.0X3.8M (BURR) ×3 IMPLANT
CHLORAPREP W/TINT 26 (MISCELLANEOUS) ×3 IMPLANT
CNTNR URN SCR LID CUP LEK RST (MISCELLANEOUS) ×3 IMPLANT
CONT SPEC 4OZ STRL OR WHT (MISCELLANEOUS) ×3
CORD BIP STRL DISP 12FT (MISCELLANEOUS) IMPLANT
CORD LIGHT LATERIAL X LIFT (MISCELLANEOUS) IMPLANT
COVERAGE SUPP BRAINLAB NG SPNE (MISCELLANEOUS) IMPLANT
COVERAGE SUPPORT SPINE BRAINLB (MISCELLANEOUS)
DERMABOND ADVANCED .7 DNX12 (GAUZE/BANDAGES/DRESSINGS) ×3 IMPLANT
DRAPE 3D C-ARM OEC (DRAPES) IMPLANT
DRAPE C ARM PK CFD 31 SPINE (DRAPES) ×3 IMPLANT
DRAPE C-ARMOR (DRAPES) IMPLANT
DRAPE INCISE IOBAN 66X45 STRL (DRAPES) IMPLANT
DRAPE LAPAROTOMY 100X77 ABD (DRAPES) ×3 IMPLANT
DRAPE MICROSCOPE SPINE 48X150 (DRAPES) ×3 IMPLANT
DRAPE SCAN PATIENT (DRAPES) ×3 IMPLANT
DRAPE SURG 17X11 SM STRL (DRAPES) ×3 IMPLANT
DRSG OPSITE POSTOP 3X4 (GAUZE/BANDAGES/DRESSINGS) IMPLANT
ELECT EZSTD 165MM 6.5IN (MISCELLANEOUS)
ELECT REM PT RETURN 9FT ADLT (ELECTROSURGICAL) ×6
ELECTRODE EZSTD 165MM 6.5IN (MISCELLANEOUS) IMPLANT
ELECTRODE REM PT RTRN 9FT ADLT (ELECTROSURGICAL) ×3 IMPLANT
EX-PIN ORTHOLOCK NAV 4X150 (PIN) IMPLANT
FEE CVG SUPP BRAINLAB NG SPNE (MISCELLANEOUS) IMPLANT
FORCEPS BPLR BAYO 10IN 1.0TIP (ORTHOPEDIC DISPOSABLE SUPPLIES) IMPLANT
GAUZE 4X4 16PLY ~~LOC~~+RFID DBL (SPONGE) IMPLANT
GLOVE BIOGEL PI IND STRL 8.5 (GLOVE) ×3 IMPLANT
GLOVE SURG SYN 6.5 ES PF (GLOVE) ×21 IMPLANT
GLOVE SURG SYN 6.5 PF PI (GLOVE) ×6 IMPLANT
GLOVE SURG SYN 8.5  E (GLOVE) ×9
GLOVE SURG SYN 8.5 E (GLOVE) ×9 IMPLANT
GLOVE SURG SYN 8.5 PF PI (GLOVE) ×9 IMPLANT
GLOVE SURG UNDER POLY LF SZ6.5 (GLOVE) ×3 IMPLANT
GLOVE SURG UNDER POLY LF SZ8.5 (GLOVE) ×3 IMPLANT
GOWN SRG LRG LVL 4 IMPRV REINF (GOWNS) ×3 IMPLANT
GOWN SRG XL LVL 3 NONREINFORCE (GOWNS) ×3 IMPLANT
GOWN STRL NON-REIN TWL XL LVL3 (GOWNS) ×3
GOWN STRL REIN LRG LVL4 (GOWNS) ×3
HEMOVAC 400CC 10FR (MISCELLANEOUS) IMPLANT
KIT DILATOR XLIF 5 (KITS) IMPLANT
KIT DISP MARS 3V (KITS) IMPLANT
KIT SPINAL PRONEVIEW (KITS) ×3 IMPLANT
MANIFOLD NEPTUNE II (INSTRUMENTS) ×3 IMPLANT
MARKER SKIN DUAL TIP RULER LAB (MISCELLANEOUS) ×3 IMPLANT
MARKER SPHERE PSV REFLC 13MM (MARKER) ×21 IMPLANT
MODULE NVM5 NEXT GEN EMG (NEEDLE) IMPLANT
NDL SAFETY ECLIP 18X1.5 (MISCELLANEOUS) ×3 IMPLANT
NS IRRIG 1000ML POUR BTL (IV SOLUTION) ×3 IMPLANT
PACK LAMINECTOMY NEURO (CUSTOM PROCEDURE TRAY) ×3 IMPLANT
PAD ARMBOARD 7.5X6 YLW CONV (MISCELLANEOUS) ×3 IMPLANT
PREVENA INCISION MGT 90 150 (MISCELLANEOUS) IMPLANT
ROD RELINE 5.5X500MM STRAIGHT (Rod) IMPLANT
ROD RELINE LORD 5.5X80MM (Rod) IMPLANT
SCREW LOCK RELINE 5.5 TULIP (Screw) IMPLANT
SCREW RELINE O POLY 8.5X50MM (Screw) IMPLANT
SCREW RELINE-0 POLY 9.5X50 (Screw) IMPLANT
SCREW RELINE-O POLY 6.5X45 (Screw) IMPLANT
SCREW RELINE-O POLY 7.5X45 (Screw) IMPLANT
SCREW RELINE-O POLY 7.5X50 (Screw) ×6 IMPLANT
SCREW RLINE PLY 2S 50X7.5XPA (Screw) IMPLANT
SCREW SPINAL 8.5X80 RELINE (Screw) IMPLANT
SOLUTION IRRIG SURGIPHOR (IV SOLUTION) ×3 IMPLANT
SPACER HEDRON 10D 18X50X11 (Spacer) IMPLANT
STAPLER SKIN PROX 35W (STAPLE) IMPLANT
SURGIFLO W/THROMBIN 8M KIT (HEMOSTASIS) ×3 IMPLANT
SUT DVC VLOC 3-0 CL 6 P-12 (SUTURE) ×3 IMPLANT
SUT ETHILON 3-0 FS-10 30 BLK (SUTURE) ×6
SUT VIC AB 0 CT1 27 (SUTURE) ×12
SUT VIC AB 0 CT1 27XCR 8 STRN (SUTURE) ×3 IMPLANT
SUT VIC AB 2-0 CT1 18 (SUTURE) ×3 IMPLANT
SUTURE EHLN 3-0 FS-10 30 BLK (SUTURE) IMPLANT
SYR 10ML LL (SYRINGE) ×6 IMPLANT
SYR 30ML LL (SYRINGE) ×6 IMPLANT
SYR 3ML LL SCALE MARK (SYRINGE) ×3 IMPLANT
TRAP FLUID SMOKE EVACUATOR (MISCELLANEOUS) ×3 IMPLANT
TROCAR INSERT W/PEDICLE NDL (TROCAR) IMPLANT
WATER STERILE IRR 1000ML POUR (IV SOLUTION) ×6 IMPLANT

## 2022-10-24 NOTE — Transfer of Care (Signed)
Immediate Anesthesia Transfer of Care Note  Patient: Sherri Dickerson  Procedure(s) Performed: L2-3 LATERAL LUMBAR INTERBODY FUSION (NUVASIVE), L2-3 POSTERIOR COLUMN OSTEOTOMY (Flank) L2-S2 POSTERIOR SPINAL FUSION (Spine Lumbar) LEFT L5-S1 FORAMINOTOMY (Spine Lumbar) APPLICATION OF INTRAOPERATIVE CT SCAN (Back)  Patient Location: PACU  Anesthesia Type:General  Level of Consciousness: drowsy  Airway & Oxygen Therapy: Patient Spontanous Breathing and Patient connected to face mask oxygen  Post-op Assessment: Report given to RN and Post -op Vital signs reviewed and stable  Post vital signs: Reviewed and stable  Last Vitals:  Vitals Value Taken Time  BP 87/58 10/24/22 1357  Temp 37.3 C 10/24/22 1353  Pulse 84 10/24/22 1400  Resp 17 10/24/22 1400  SpO2 99 % 10/24/22 1400  Vitals shown include unvalidated device data.  Last Pain:  Vitals:   10/24/22 1353  TempSrc:   PainSc: Asleep         Complications: No notable events documented. Dr. Erenest Rasher notified of low blood pressures, will given more fluid and albumin in PACU and re-assess.

## 2022-10-24 NOTE — Anesthesia Preprocedure Evaluation (Signed)
Anesthesia Evaluation  Patient identified by MRN, date of birth, ID band Patient awake    Reviewed: Allergy & Precautions, H&P , NPO status , Patient's Chart, lab work & pertinent test results, reviewed documented beta blocker date and time   Airway Mallampati: III  TM Distance: >3 FB Neck ROM: full    Dental  (+) Teeth Intact   Pulmonary sleep apnea and Continuous Positive Airway Pressure Ventilation , Patient abstained from smoking., former smoker   Pulmonary exam normal        Cardiovascular Exercise Tolerance: Poor hypertension, On Medications negative cardio ROS Normal cardiovascular exam Rhythm:regular Rate:Normal     Neuro/Psych  Headaches  Anxiety      Neuromuscular disease  negative psych ROS   GI/Hepatic Neg liver ROS,GERD  Medicated,,  Endo/Other  diabetesHypothyroidism  Morbid obesity  Renal/GU negative Renal ROS  negative genitourinary   Musculoskeletal   Abdominal   Peds  Hematology negative hematology ROS (+)   Anesthesia Other Findings Past Medical History: No date: Anxiety No date: Arthritis No date: Carpal tunnel syndrome, bilateral No date: Diabetes mellitus without complication (HCC) No date: Diabetic neuropathy (HCC) No date: Family history of adverse reaction to anesthesia     Comment:  daughter woke up during colonoscopy No date: Gastritis No date: GERD (gastroesophageal reflux disease) 08/23/2022: History of 2019 novel coronavirus disease (COVID-19) No date: Hyperlipidemia No date: Hypertension No date: Hypothyroidism No date: Migraines No date: Personal history of radiation therapy No date: Sleep apnea No date: Status post radiation therapy     Comment:  thyroid 2012?: Thyroid cancer ([place]) No date: Type 2 diabetes mellitus with peripheral neuropathy ([place]) Past Surgical History: 2005: ABDOMINAL HYSTERECTOMY 2013?: COLONOSCOPY     Comment:  Dr Donnella Sham 03/21/2022: COLONOSCOPY;  N/A     Comment:  Procedure: COLONOSCOPY;  Surgeon: Annamaria Helling,              DO;  Location: [facility] ENDOSCOPY;  Service:               Gastroenterology;  Laterality: N/A; 03/21/2022: ESOPHAGOGASTRODUODENOSCOPY; N/A     Comment:  Procedure: ESOPHAGOGASTRODUODENOSCOPY (EGD);  Surgeon:               Annamaria Helling, DO;  Location: [facility] ENDOSCOPY;                Service: Gastroenterology;  Laterality: N/A; 07/25/2016: FORAMINOTOMY 2 LEVEL     Comment:  Procedure: FORAMINOTOMY 2 LEVEL;  Surgeon: Deetta Perla,               MD;  Location: [facility] ORS;  Service: Neurosurgery;;  L4-5,               L3-4 07/25/2016: LUMBAR LAMINECTOMY/DECOMPRESSION MICRODISCECTOMY; Left     Comment:  Procedure: LUMBAR LAMINECTOMY/DECOMPRESSION               MICRODISCECTOMY 2 LEVELS;  Surgeon: Deetta Perla, MD;                Location: [facility] ORS;  Service: Neurosurgery;  Laterality:               Left;  L3-4 hemilaminectomy and  L4-5 full laminectomy No date: NASAL SINUS SURGERY     Comment:  x 2 No date: OOPHORECTOMY 2012?: THYROIDECTOMY     Comment:  Dr Kathyrn Sheriff No date: TONSILLECTOMY 04/24/2017: TRANSFORAMINAL LUMBAR INTERBODY FUSION (TLIF) WITH  PEDICLE SCREW FIXATION 3 LEVEL; Bilateral     Comment:  Procedure:  TRANSFORAMINAL LUMBAR INTERBODY FUSION (TLIF)              WITH PEDICLE SCREW FIXATION 3 LEVEL-L3-S1;  Surgeon:               Deetta Perla, MD;  Location: [facility] ORS;  Service:               Neurosurgery;  Laterality: Bilateral; No date: tubes in ears     Comment:  when she was 62 years old BMI    Body Mass Index: 36.32 kg/m     Reproductive/Obstetrics negative OB ROS                             Anesthesia Physical Anesthesia Plan  ASA: 3  Anesthesia Plan: General ETT   Post-op Pain Management:    Induction:   PONV Risk Score and Plan: 4 or greater  Airway Management Planned:   Additional Equipment:   Intra-op Plan:   Post-operative Plan:    Informed Consent: I have reviewed the patients History and Physical, chart, labs and discussed the procedure including the risks, benefits and alternatives for the proposed anesthesia with the patient or authorized representative who has indicated his/her understanding and acceptance.     Dental Advisory Given  Plan Discussed with: CRNA  Anesthesia Plan Comments:        Anesthesia Quick Evaluation

## 2022-10-24 NOTE — Interval H&P Note (Signed)
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  The various methods of treatment have been discussed with the patient and family. After consideration of risks, benefits and other options for treatment, the patient has consented to  Procedure(s): L2-3 LATERAL LUMBAR INTERBODY FUSION (NUVASIVE), L2-3 POSTERIOR COLUMN OSTEOTOMY (N/A) L2-S2 POSTERIOR SPINAL FUSION (N/A) LEFT L5-S1 FORAMINOTOMY (N/A) APPLICATION OF INTRAOPERATIVE CT SCAN (N/A) as a surgical intervention.  The patient's history has been reviewed, patient examined, no change in status, stable for surgery.  I have reviewed the patient's chart and labs.  Questions were answered to the patient's satisfaction.    Heart sounds normal no MRG. Chest Clear to Auscultation Bilaterally.   Ria Redcay

## 2022-10-24 NOTE — Op Note (Addendum)
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   Preoperative Note:   Risks of surgery discussed include: infection, bleeding, stroke, coma, death, paralysis, CSF leak, nerve/spinal cord injury, numbness, tingling, weakness, complex regional pain syndrome, recurrent stenosis and/or disc herniation, vascular injury, development of instability, neck/back pain, need for further surgery, persistent symptoms, development of deformity, and the risks of anesthesia. The patient understood these risks and agreed to proceed.  NAME OF ANTERIOR PROCEDURE:               1. Anterior lumbar interbody fusion via a right lateral retroperitoneal approach at L2/3 2. Placement of a Lordotic Globus Hedron interbody cage, filled with Demineralized Bone Matrix  NAME OF POSTERIOR PROCEDURE 1. Posterior instrumentation using Nuvasive Reline Instrumentation, L2-S2 2. Posterolateral fusion, L2-4 and L5/S1, utilizing demineralized bone matrix 3. Use of Stereotaxis 4. Instrumentation to the pelvis via S2 alar iliac screws.   PROCEDURE:  Patient was brought to the operating room, intubated, turned to the lateral position.  All pressure points were checked and double-checked.  The patient was prepped and draped in the standard fashion. Prior to prepping,  fluoroscopy was brought in and the patient was positioned with a large bump under the contralateral side between the iliac crest and rib cage, allowing the area between the iliac crest and the lateral aspect of the rib cage to open and increase the ability to reach inferiorly, to facilitate entry into the disc space.  The incision was marked upon the skin both the location of the disc space as well as the superior most aspect of the iliac crest.  Based on the identification of the disc space an incision was prepared, marked upon the skin and eventually was used for our lateral incision.  The fluoroscopy was turned into a cross table A/P image in order to confirm that the patient's spine remained in a perpendicular trajectory to the floor without rotation.  Once confirming that all the pressure points were checked and double-checked and the patient remained in sturdy position strapped down in this slightly jack-knifed lateral position, the patient was prepped and draped in standard fashion.  The skin was injected with local anesthetic, then incised until the abdominal wall fascia was noted.  I bluntly dissected posteriorly until we were able to identify the posterior musculature near petit's triangle.  At this point, using primarily blunt dissection with our finger aided with a metzenbaum scissor, were able to enter the retroperitoneal cavity.  The retroperitoneal potential space was opened further until palpating out the psoas muscle, the medial aspect of the iliac crest, the medial aspect of the last rib and continued to define the retroperitoneal space with blunt dissection in order to facilitate safe placement of our dilators.    While protecting by dissecting directly onto a finger in the retroperitoneum, the retroperitoneal space was entered safely from the lateral incision and the initial dilator placed onto the muscle belly of the psoas.  While directly stimulating the dilator and after radiographically  confirming our location relative to the disc space, I placed the dilator through the psoas.  The dilators were stimulated to ensure remaining safely away from any of the lumbar plexus nerves; the dilators were repositioned until no pathologic stimulation was appreciated.  Once I had confirmed the location of our initial dilator radiographically, a K-wire secured the dilator into the L2/3 disc space and confirmed position under A/P and lateral fluoroscopy.  At this point, I dilated up with direct stimulation to confirm lack of pathologic stimulation.  Once all the dilators were in position, I placed in the retractor and secured it onto the table, locked into position and confirmed under A/P and lateral fluoroscopy to confirm our approach angle to the disc space as well as location relative to the disc space.  I then placed the muscle stimulator in through the working channel down to the vertebral body, stimulating the entire lateral surface of the vertebral body and any of the visualized psoas muscle that was adjacent to the retractor, confirming again the safe passage to the psoas before we began performing the discectomy.  At this point, we began our discectomy at L2/3.  The disc was incised laterally throughout the extent of our exposure. Using a combination of pituitary rongeurs, Kerrison rongeurs, rasps, curettes of various sorts, we were able to begin to clean out the disc space.  Once we had cleaned out the majority of the disc space, we then cut the lateral annulus with a cob, breaking the lateral annual attachments on the contralateral side by subtly working the cob through the annulus while using flouroscopy.  Care was taken not to extend further than required after cutting the annular attachments.  After this had been performed, we prepared the endplates for placement of our graft, sized a graft to the disc space by serially dilating up in trial sizes until we confirmed that our graft would be well  positioned, allowing distraction while maintaining good grip.  This was confirmed under A/P and lateral fluoroscopy in order to ensure its placement as an eventual trial for placement of our final graft.  We irrigated with bacteriostatic saline.  Once confirmed placement, the Hedron implant filled with allograft was impacted into position at L2/3.   Through a combination of intradiscal distraction and anterior releasing, we were able to correct the anterior deformity during disc preparation and placement of the graft.  At this point, final radiographs were performed, and we began closure.  The wound was closed using 0 Vicryl interrupted suture in the fascia and 2-0 Vicryl inverted suture were placed in the subcutaneous tissue and dermis. 3-0 monocryl was used for final closure. Dermabond was used to close the skin.    After closing the anterior part in layers, the patient was repositioned into prone position.  All pressure points were checked and double-checked.  The posterior operative site was prepped and draped in standard fashion.  The prior incision was identified.  The incision was injected with local anesthetic and the timeout was confirmed.  The incision was opened sharply and extended superiorly and inferiorly to allow for additional instrumentation.  The soft tissues were divided and the paraspinous muscles reflected until the prior instrumentation from L3-S1 was identified.  This was removed without incident.  The exposure was extended to L2 and asked to including the transverse processes at each level.  The self-retaining retractors were replaced.  At this point, the prior implants were removed  and the sizes noted.  The stereotactic array was placed at L2.  Stereotactic images were acquired and registered to the patient.  Using the stereotactic drill guide, a new tract was drilled at L2 bilaterally.  6.5 x 45 mm screws were placed bilaterally at this level.  At L3, 2 new tracts were drilled as  the prior tracts were haloed.  7.5 x 45 mm screws were placed bilaterally at L3.  At L4-S1, the prior tracts were utilized.  7.5 x 45 mm screws were placed at L4, 7.5 x 50 mm screws had L5, and an 8.5 x 50 mm screw was placed on the right at S1.  An 9.5 x 50 mm screw was placed on the left at S1.  We then utilized the stereotactic system to drill a starting point through the sacrum into the ilium across the sacroiliac joint for placement of S2 alar iliac screws.  This was drilled to 60 mm, and then extended with pedicle finder probe to 80 mm.  8.5 x 80 mm screws were placed bilaterally crossing the sacroiliac joint, thus terminating the construct in the pelvis.  We then turned our attention to the left L5-S1 foraminotomy.  The left L5-S1 facet joint was fully identified.  Using a high-speed drill, the left L5 inferior articulating process was removed.  The superior portion of the left S1 superior articulating process was also removed.  The medial aspect of the facet joint was thinned until a small portion of cortical bone remained.  This was then carefully dissected free from the ligamentum flavum and removed.  The ligament flavum was exposed and removed on the left side.  The left L5 and S1 nerve roots were identified.  The left S1 nerve root was decompressed.  The left L5 nerve root was decompressed until no further compression was identified.  Please note that this left L5-S1 foraminotomy was performed via a trans facet and partially transpedicular route.  At L2/3, the drill was used to remove the inferior articulating process of L2 bilaterally.  The superior articulating process of L3 was removed bilaterally.  The ligamentum flavum was removed in entirety. The leading edge of L3 was removed.  At this point, the entire facet joint had been removed bilaterally, and a chevron-shaped closing osteotomy had been made in the posterior column.  Thus, a single column osteotomy was completed at L2/3.  Rods were  measured and shaped.  The rods were then secured with locking caps.  The posterior column osteotomy at L2-3 was carefully compressed to close this osteotomy.  The locking caps were then final tightened.  The stereotactic C-arm was then brought back into the field and 3D images were acquired which confirmed appropriate placement of implants.   At this point we turned our attention to closure.  Hemostasis was achieved.  The operative site was copiously irrigated.  The high-speed drill was used to decorticate L2-S2.  Combination of allograft and autograft was placed to allow for arthrodesis.  Vancomycin powder was placed on the implants.  We then placed 2 drains in the subfascial area.  The incision was closed in layers with Monocryl on the skin.  A negative pressure dressing was placed on the incision.  The patient was then handed over to anesthesia for extubation.  All counts were correct x 2 at the end of the case.    Cooper Render PA assisted in the entire procedure. An assistant was required for this procedure due to the complexity.  The assistant  provided assistance in tissue manipulation and suction, and was required for the successful and safe performance of the procedure. I performed the critical portions of the procedure.   Meade Maw MD Neurosurgery

## 2022-10-24 NOTE — Anesthesia Postprocedure Evaluation (Signed)
Anesthesia Post Note  Patient: Sherri Dickerson  Procedure(s) Performed: L2-3 LATERAL LUMBAR INTERBODY FUSION (NUVASIVE), L2-3 POSTERIOR COLUMN OSTEOTOMY (Flank) L2-S2 POSTERIOR SPINAL FUSION (Spine Lumbar) LEFT L5-S1 FORAMINOTOMY (Spine Lumbar) APPLICATION OF INTRAOPERATIVE CT SCAN (Back)  Patient location during evaluation: PACU Anesthesia Type: General Level of consciousness: awake and alert, oriented and patient cooperative Pain management: pain level controlled Vital Signs Assessment: post-procedure vital signs reviewed and stable Respiratory status: spontaneous breathing, nonlabored ventilation and respiratory function stable Cardiovascular status: blood pressure returned to baseline and stable Postop Assessment: adequate PO intake Anesthetic complications: no   No notable events documented.   Last Vitals:  Vitals:   10/24/22 1500 10/24/22 1510  BP: (!) 105/56 105/75  Pulse: 88 90  Resp: (!) 26 14  Temp: (!) 36.2 C   SpO2: 97% 98%    Last Pain:  Vitals:   10/24/22 1510  TempSrc:   PainSc: [place]

## 2022-10-24 NOTE — Anesthesia Procedure Notes (Signed)
Procedure Name: Intubation Date/Time: 10/24/2022 7:35 AM  Performed by: Lia Foyer, CRNAPre-anesthesia Checklist: Patient identified, Emergency Drugs available, Suction available and Patient being monitored Patient Re-evaluated:Patient Re-evaluated prior to induction Oxygen Delivery Method: Circle system utilized Preoxygenation: Pre-oxygenation with 100% oxygen Induction Type: IV induction Ventilation: Mask ventilation without difficulty Laryngoscope Size: McGraph and 3 Grade View: Grade II Tube type: Oral Tube size: 7.5 mm Number of attempts: 1 Airway Equipment and Method: Stylet, Oral airway and Video-laryngoscopy Placement Confirmation: ETT inserted through vocal cords under direct vision, positive ETCO2 and breath sounds checked- equal and bilateral Secured at: 22 cm Tube secured with: Tape Dental Injury: Teeth and Oropharynx as per pre-operative assessment

## 2022-10-25 ENCOUNTER — Encounter: Payer: Self-pay | Admitting: Neurosurgery

## 2022-10-25 LAB — CBC
HCT: 25.5 % — ABNORMAL LOW (ref 36.0–46.0)
Hemoglobin: 8.5 g/dL — ABNORMAL LOW (ref 12.0–15.0)
MCH: 31.6 pg (ref 26.0–34.0)
MCHC: 33.3 g/dL (ref 30.0–36.0)
MCV: 94.8 fL (ref 80.0–100.0)
Platelets: 139 10*3/uL — ABNORMAL LOW (ref 150–400)
RBC: 2.69 MIL/uL — ABNORMAL LOW (ref 3.87–5.11)
RDW: 12.8 % (ref 11.5–15.5)
WBC: 9.6 10*3/uL (ref 4.0–10.5)
nRBC: 0 % (ref 0.0–0.2)

## 2022-10-25 LAB — GLUCOSE, CAPILLARY
Glucose-Capillary: 134 mg/dL — ABNORMAL HIGH (ref 70–99)
Glucose-Capillary: 102 mg/dL — ABNORMAL HIGH (ref 70–99)
Glucose-Capillary: 112 mg/dL — ABNORMAL HIGH (ref 70–99)
Glucose-Capillary: 130 mg/dL — ABNORMAL HIGH (ref 70–99)

## 2022-10-25 NOTE — Plan of Care (Signed)
  Problem: Education: Goal: Ability to describe self-care measures that may prevent or decrease complications (Diabetes Survival Skills Education) will improve Outcome: Progressing Goal: Individualized Educational Video(s) Outcome: Progressing   Problem: Coping: Goal: Ability to adjust to condition or change in health will improve Outcome: Progressing   Problem: Fluid Volume: Goal: Ability to maintain a balanced intake and output will improve Outcome: Progressing   Problem: Health Behavior/Discharge Planning: Goal: Ability to identify and utilize available resources and services will improve Outcome: Progressing Goal: Ability to manage health-related needs will improve Outcome: Progressing   Problem: Metabolic: Goal: Ability to maintain appropriate glucose levels will improve Outcome: Progressing   Problem: Nutritional: Goal: Maintenance of adequate nutrition will improve Outcome: Progressing Goal: Progress toward achieving an optimal weight will improve Outcome: Progressing   Problem: Skin Integrity: Goal: Risk for impaired skin integrity will decrease Outcome: Progressing   Problem: Tissue Perfusion: Goal: Adequacy of tissue perfusion will improve Outcome: Progressing   Problem: Education: Goal: Ability to verbalize activity precautions or restrictions will improve Outcome: Progressing Goal: Knowledge of the prescribed therapeutic regimen will improve Outcome: Progressing Goal: Understanding of discharge needs will improve Outcome: Progressing   Problem: Activity: Goal: Ability to avoid complications of mobility impairment will improve Outcome: Progressing Goal: Ability to tolerate increased activity will improve Outcome: Progressing Goal: Will remain free from falls Outcome: Progressing   Problem: Bowel/Gastric: Goal: Gastrointestinal status for postoperative course will improve Outcome: Progressing   Problem: Clinical Measurements: Goal: Ability to  maintain clinical measurements within normal limits will improve Outcome: Progressing Goal: Postoperative complications will be avoided or minimized Outcome: Progressing Goal: Diagnostic test results will improve Outcome: Progressing   Problem: Pain Management: Goal: Pain level will decrease Outcome: Progressing   Problem: Skin Integrity: Goal: Will show signs of wound healing Outcome: Progressing   Problem: Health Behavior/Discharge Planning: Goal: Identification of resources available to assist in meeting health care needs will improve Outcome: Progressing   Problem: Bladder/Genitourinary: Goal: Urinary functional status for postoperative course will improve Outcome: Progressing   Problem: Education: Goal: Knowledge of General Education information will improve Description: Including pain rating scale, medication(s)/side effects and non-pharmacologic comfort measures Outcome: Progressing   Problem: Health Behavior/Discharge Planning: Goal: Ability to manage health-related needs will improve Outcome: Progressing   Problem: Clinical Measurements: Goal: Ability to maintain clinical measurements within normal limits will improve Outcome: Progressing Goal: Will remain free from infection Outcome: Progressing Goal: Diagnostic test results will improve Outcome: Progressing Goal: Respiratory complications will improve Outcome: Progressing Goal: Cardiovascular complication will be avoided Outcome: Progressing   Problem: Activity: Goal: Risk for activity intolerance will decrease Outcome: Progressing   Problem: Nutrition: Goal: Adequate nutrition will be maintained Outcome: Progressing   Problem: Coping: Goal: Level of anxiety will decrease Outcome: Progressing   Problem: Elimination: Goal: Will not experience complications related to bowel motility Outcome: Progressing Goal: Will not experience complications related to urinary retention Outcome: Progressing    Problem: Pain Managment: Goal: General experience of comfort will improve Outcome: Progressing   Problem: Safety: Goal: Ability to remain free from injury will improve Outcome: Progressing   Problem: Skin Integrity: Goal: Risk for impaired skin integrity will decrease Outcome: Progressing   

## 2022-10-25 NOTE — Plan of Care (Signed)
  Problem: Education: Goal: Ability to describe self-care measures that may prevent or decrease complications (Diabetes Survival Skills Education) will improve Outcome: Progressing   Problem: Coping: Goal: Ability to adjust to condition or change in health will improve Outcome: Progressing   Problem: Fluid Volume: Goal: Ability to maintain a balanced intake and output will improve Outcome: Progressing   Problem: Health Behavior/Discharge Planning: Goal: Ability to identify and utilize available resources and services will improve Outcome: Progressing Goal: Ability to manage health-related needs will improve Outcome: Progressing   Problem: Metabolic: Goal: Ability to maintain appropriate glucose levels will improve Outcome: Progressing   Problem: Nutritional: Goal: Maintenance of adequate nutrition will improve Outcome: Progressing Goal: Progress toward achieving an optimal weight will improve Outcome: Progressing   Problem: Skin Integrity: Goal: Risk for impaired skin integrity will decrease Outcome: Progressing   Problem: Tissue Perfusion: Goal: Adequacy of tissue perfusion will improve Outcome: Progressing   Problem: Education: Goal: Ability to verbalize activity precautions or restrictions will improve Outcome: Progressing Goal: Knowledge of the prescribed therapeutic regimen will improve Outcome: Progressing Goal: Understanding of discharge needs will improve Outcome: Progressing   Problem: Activity: Goal: Ability to avoid complications of mobility impairment will improve Outcome: Progressing Goal: Ability to tolerate increased activity will improve Outcome: Progressing Goal: Will remain free from falls Outcome: Progressing   Problem: Bowel/Gastric: Goal: Gastrointestinal status for postoperative course will improve Outcome: Progressing   Problem: Clinical Measurements: Goal: Ability to maintain clinical measurements within normal limits will  improve Outcome: Progressing Goal: Postoperative complications will be avoided or minimized Outcome: Progressing Goal: Diagnostic test results will improve Outcome: Progressing   Problem: Pain Management: Goal: Pain level will decrease Outcome: Progressing   Problem: Skin Integrity: Goal: Will show signs of wound healing Outcome: Progressing   Problem: Health Behavior/Discharge Planning: Goal: Identification of resources available to assist in meeting health care needs will improve Outcome: Progressing   Problem: Bladder/Genitourinary: Goal: Urinary functional status for postoperative course will improve Outcome: Progressing   Problem: Education: Goal: Knowledge of General Education information will improve Description: Including pain rating scale, medication(s)/side effects and non-pharmacologic comfort measures Outcome: Progressing   Problem: Health Behavior/Discharge Planning: Goal: Ability to manage health-related needs will improve Outcome: Progressing   Problem: Clinical Measurements: Goal: Ability to maintain clinical measurements within normal limits will improve Outcome: Progressing Goal: Will remain free from infection Outcome: Progressing Goal: Diagnostic test results will improve Outcome: Progressing Goal: Respiratory complications will improve Outcome: Progressing Goal: Cardiovascular complication will be avoided Outcome: Progressing   Problem: Activity: Goal: Risk for activity intolerance will decrease Outcome: Progressing   Problem: Nutrition: Goal: Adequate nutrition will be maintained Outcome: Progressing   Problem: Coping: Goal: Level of anxiety will decrease Outcome: Progressing   Problem: Elimination: Goal: Will not experience complications related to bowel motility Outcome: Progressing Goal: Will not experience complications related to urinary retention Outcome: Progressing   Problem: Pain Managment: Goal: General experience of  comfort will improve Outcome: Progressing   Problem: Safety: Goal: Ability to remain free from injury will improve Outcome: Progressing   Problem: Skin Integrity: Goal: Risk for impaired skin integrity will decrease Outcome: Progressing

## 2022-10-25 NOTE — Progress Notes (Signed)
Brief Nutrition Note  Case discussed with nutritional services department. Pt with multiple documented allergies (peanut butter flavor, soybeans, and aspartame). Pt with limited menu options and pt shares that these are not true allergies. Allergies have been updated.   Loistine Chance, RD, LDN, [place] Registered Dietitian II Certified Diabetes Care and Education Specialist Please refer to [facility] for RD and/or RD on-call/weekend/after hours pager

## 2022-10-25 NOTE — Evaluation (Signed)
Occupational Therapy Evaluation Patient Details Name: Sherri Dickerson MRN: [id number] DOB: Nov 03, 1960 Today's Date: 10/25/2022   History of Present Illness Sherri Dickerson is a 62 y.o. 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 Now s/p Posterolateral fusion, L2-4 and L5/S1 on 10/24/22   Clinical Impression   Sherri Dickerson was seen for OT evaluation this date. Prior to hospital admission, pt was IND including working. Pt lives with spouse and daugther. Pt currently requires MIN A don underwear, assist for pulling up over rear. MIN cues don LSO seated. MOD I log rolling for bed mobility. SBA + RW sit<>stand and ~60 ft mobility. Pt educated in functional application of back precautions, compression sock mgmt, and DME recs. Pt verbalized understanding, all education complete, will sign off. Upon hospital discharge, recommend no OT follow up.   Recommendations for follow up therapy are one component of a multi-disciplinary discharge planning process, led by the attending physician.  Recommendations may be updated based on patient status, additional functional criteria and insurance authorization.   Follow Up Recommendations  No OT follow up     Assistance Recommended at Discharge Set up Supervision/Assistance  Patient can return home with the following A little help with bathing/dressing/bathroom;Help with stairs or ramp for entrance    Functional Status Assessment  Patient has had a recent decline in their functional status and demonstrates the ability to make significant improvements in function in a reasonable and predictable amount of time.  Equipment Recommendations  None recommended by OT    Recommendations for Other Services       Precautions / Restrictions Precautions Precautions: Fall;Back Restrictions Weight Bearing Restrictions: No      Mobility Bed Mobility Overal bed mobility: Modified Independent              General bed mobility comments: log rolls no cues    Transfers Overall transfer level: Needs assistance Equipment used: Rolling walker (2 wheels) Transfers: Sit to/from Stand Sit to Stand: Supervision                  Balance Overall balance assessment: Needs assistance Sitting-balance support: No upper extremity supported, Feet supported Sitting balance-Leahy Scale: Normal     Standing balance support: No upper extremity supported, During functional activity Standing balance-Leahy Scale: Good                             ADL either performed or assessed with clinical judgement   ADL Overall ADL's : Needs assistance/impaired                                       General ADL Comments: MIN A don underwear, assist for pulling up over rear. MIN cues don LSO seated. SBA + RW for simulated toilet t/f      Pertinent Vitals/Pain Pain Assessment Pain Assessment: No/denies pain     Hand Dominance     Extremity/Trunk Assessment Upper Extremity Assessment Upper Extremity Assessment: Overall WFL for tasks assessed   Lower Extremity Assessment Lower Extremity Assessment: Overall WFL for tasks assessed       Communication Communication Communication: No difficulties   Cognition Arousal/Alertness: Awake/alert Behavior During Therapy: WFL for tasks assessed/performed Overall Cognitive Status: Within Functional Limits for tasks assessed  Home Living Family/patient expects to be discharged to:: Private residence Living Arrangements: Spouse/significant other;Children Available Help at Discharge: Family Type of Home: House Home Access: Stairs to enter Technical brewer of Steps: 4-5 Entrance Stairs-Rails: Left Home Layout: One level     Bathroom Shower/Tub: Chief Strategy Officer: [place] (4 wheels);Shower seat;Grab bars - toilet           Prior Functioning/Environment Prior Level of Function : Independent/Modified Independent;Working/employed             Mobility Comments: works at [employer] at [employer] job          OT Problem List: Decreased range of motion;Decreased activity tolerance         OT Goals(Current goals can be found in the care plan section) Acute Rehab OT Goals Patient Stated Goal: to go home OT Goal Formulation: With patient/family Time For Goal Achievement: 11/08/22 Potential to Achieve Goals: Good   AM-PAC OT "6 Clicks" Daily Activity     Outcome Measure Help from another person eating meals?: None Help from another person taking care of personal grooming?: None Help from another person toileting, which includes using toliet, bedpan, or urinal?: None Help from another person bathing (including washing, rinsing, drying)?: A Little Help from another person to put on and taking off regular upper body clothing?: None Help from another person to put on and taking off regular lower body clothing?: A Little 6 Click Score: 22   End of Session    Activity Tolerance: Patient tolerated treatment well Patient left: in chair;with call bell/phone within reach;with family/visitor present  OT Visit Diagnosis: Unsteadiness on feet (R26.81)                Time: [id number] OT Time Calculation (min): 20 min Charges:  OT General Charges $OT Visit: 1 Visit OT Evaluation $OT Eval Low Complexity: 1 Low OT Treatments $Self Care/Home Management : 8-22 mins  Dessie Coma, M.S. OTR/L  10/25/22, 9:53 AM  ascom [phone]

## 2022-10-25 NOTE — Evaluation (Signed)
Physical Therapy Evaluation Patient Details Name: Sherri Dickerson MRN: [id number] DOB: 07-14-1961 Today's Date: 10/25/2022  History of Present Illness  Sherri Dickerson is a 62 y.o. 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 Now s/p Posterolateral fusion, L2-4 and L5/S1 on 10/24/22    Clinical Impression  Pt received in Semi-Fowler's position and agreeable to therapy.  Pt stating she just got back in bed, but is ready to begin therapy.  Pt performs log roll with good technique and does need verbal cuing as she has good recall from prior sessions and from prior surgery.  Pt performs transfers well and only needs assistance with drains/vac.  Otherwise, pt is able to mobilize modified independently with the use of the FWW.  Pt has adequate speed and is able to perform stair training with use of the rails as well.  Pt returned to room with all needs met and husband in room.  Pt left with call bell within reach.  Patient is is modified independent with all aspects of mobility, all education completed, and time is given to address all questions/concerns. No additional skilled PT services needed at this time, PT signing off. PT recommends daily ambulation ad lib or with nursing staff as needed to prevent deconditioning.          Recommendations for follow up therapy are one component of a multi-disciplinary discharge planning process, led by the attending physician.  Recommendations may be updated based on patient status, additional functional criteria and insurance authorization.  Follow Up Recommendations No PT follow up      Assistance Recommended at Discharge PRN  Patient can return home with the following  A little help with walking and/or transfers;A little help with bathing/dressing/bathroom    Equipment Recommendations None recommended by PT  Recommendations for Other Services       Functional Status Assessment Patient has had a  recent decline in their functional status and demonstrates the ability to make significant improvements in function in a reasonable and predictable amount of time.     Precautions / Restrictions Precautions Precautions: Fall;Back Restrictions Weight Bearing Restrictions: No      Mobility  Bed Mobility Overal bed mobility: Modified Independent             General bed mobility comments: log rolls no cues    Transfers Overall transfer level: Needs assistance Equipment used: Rolling walker (2 wheels) Transfers: Sit to/from Stand Sit to Stand: Supervision                Ambulation/Gait Ambulation/Gait assistance: Modified independent (Device/Increase time) Gait Distance (Feet): 200 Feet Assistive device: Rolling walker (2 wheels) Gait Pattern/deviations: WFL(Within Functional Limits) Gait velocity: Good speed with use of the walker.     General Gait Details: Pt ambulated to the therapy gym and back to room.  Stairs            Wheelchair Mobility    Modified Rankin (Stroke Patients Only)       Balance Overall balance assessment: Needs assistance Sitting-balance support: No upper extremity supported, Feet supported Sitting balance-Leahy Scale: Normal     Standing balance support: No upper extremity supported, During functional activity Standing balance-Leahy Scale: Good                               Pertinent Vitals/Pain Pain Assessment Pain Assessment: No/denies pain    Home  Living Family/patient expects to be discharged to:: Private residence Living Arrangements: Spouse/significant other;Children Available Help at Discharge: Family Type of Home: House Home Access: Stairs to enter Entrance Stairs-Rails: Left Entrance Stairs-Number of Steps: 4-5   Home Layout: One level Home Equipment: Rollator (4 wheels);Shower seat;Grab bars - toilet      Prior Function Prior Level of Function : Independent/Modified  Independent;Working/employed             Mobility Comments: works at [employer] at [employer] job       [employer]   Dominant Hand: Left    Extremity/Trunk Assessment   Upper Extremity Assessment Upper Extremity Assessment: Overall WFL for tasks assessed    Lower Extremity Assessment Lower Extremity Assessment: Overall WFL for tasks assessed       Communication   Communication: No difficulties  Cognition Arousal/Alertness: Awake/alert Behavior During Therapy: WFL for tasks assessed/performed Overall Cognitive Status: Within Functional Limits for tasks assessed                                          General Comments      Exercises     Assessment/Plan    PT Assessment Patient does not need any further PT services  PT Problem List         PT Treatment Interventions Gait training;Stair training;Functional mobility training;Therapeutic activities;Therapeutic exercise;Balance training;Neuromuscular re-education    PT Goals (Current goals can be found in the Care Plan section)  Acute Rehab PT Goals PT Goal Formulation: All assessment and education complete, DC therapy    Frequency Other (Comment) (pt is d/c in house.)     Co-evaluation               AM-PAC PT "6 Clicks" Mobility  Outcome Measure Help needed turning from your back to your side while in a flat bed without using bedrails?: None Help needed moving from lying on your back to sitting on the side of a flat bed without using bedrails?: None Help needed moving to and from a bed to a chair (including a wheelchair)?: None Help needed standing up from a chair using your arms (e.g., wheelchair or bedside chair)?: None Help needed to walk in hospital room?: None Help needed climbing 3-5 steps with a railing? : None 6 Click Score: 24    End of Session Equipment Utilized During Treatment: Gait belt;Back brace Activity Tolerance: Patient tolerated treatment well;No increased  pain Patient left: in bed;with call bell/phone within reach;with family/visitor present Nurse Communication: Mobility status PT Visit Diagnosis: Muscle weakness (generalized) (M62.81)    Time:  [id number] PT Time Calculation (min) (ACUTE ONLY): 18 min   Charges:   PT Evaluation $PT Eval Low Complexity: 1 Low          Gwenlyn Saran, PT, DPT Physical Therapist - [facility]  10/25/22, 2:37 PM   Christie Nottingham 10/25/2022, 2:33 PM

## 2022-10-25 NOTE — Progress Notes (Signed)
Progress Note  History: Sherri Dickerson is s/p L2-3 XLIF and L2-S2 fusion.   POD1: Pt doing well this morning with minimal complaints. Reports good pain control  Physical Exam: Vitals:   10/24/22 2359 10/25/22 0819  BP: (!) 110/57 132/65  Pulse: 92 94  Resp: 20 18  Temp: 98.2 F (36.8 C) 98.2 F (36.8 C)  SpO2: 98% 100%    AA Ox3 CNI Incision covered with wound vac Strength:MAEW. Ambulating the halls with PT HV output 390 since surgery  Data:  Other tests/results: none  Assessment/Plan:  Sherri Dickerson is a 62 y.o presenting with sagittal plane imbalance, chronic bilateral low back pain with left-sided sciatica and pseudoarthrosis s/p lumbar fusion.   - mobilize - pain control - DVT prophylaxis - PTOT - will continue HV for now  Cooper Render PA-C Department of Neurosurgery

## 2022-10-26 LAB — GLUCOSE, CAPILLARY
Glucose-Capillary: 128 mg/dL — ABNORMAL HIGH (ref 70–99)
Glucose-Capillary: 99 mg/dL (ref 70–99)
Glucose-Capillary: 126 mg/dL — ABNORMAL HIGH (ref 70–99)
Glucose-Capillary: 113 mg/dL — ABNORMAL HIGH (ref 70–99)

## 2022-10-26 NOTE — Progress Notes (Signed)
Progress Note  History: Sherri Dickerson is s/p L2-3 XLIF and L2-S2 fusion.   POD2: Pt without complaints this morning POD1: Pt doing well this morning with minimal complaints. Reports good pain control  Physical Exam: Vitals:   10/25/22 2312 10/26/22 0331  BP: (!) 102/54 128/73  Pulse: 98 94  Resp: 16 16  Temp: 98.5 F (36.9 C) 98.6 F (37 C)  SpO2: 96% 92%    AA Ox3 CNI Incision covered with wound vac Strength:5/5 throughout BLE HV: left 60 right 75 yesterday  Data:  Other tests/results: none  Assessment/Plan:  Sherri Dickerson is a 62 y.o presenting with sagittal plane imbalance, chronic bilateral low back pain with left-sided sciatica and pseudoarthrosis s/p lumbar fusion.   - mobilize - pain control - DVT prophylaxis - PTOT - Will discuss removal of HV today  Cooper Render PA-C Department of Neurosurgery

## 2022-10-26 NOTE — TOC CM/SW Note (Signed)
  Transition of Care (TOC) Screening Note   Patient Details  Name: Sherri Dickerson Date of Birth: Mar 03, 1961   Transition of Care [facility] CM/SW Contact:    Candie Chroman, LCSW Phone Number: 10/26/2022, 8:22 AM    Transition of Care Department [facility]) has reviewed patient and no TOC needs have been identified at this time. We will continue to monitor patient advancement through interdisciplinary progression rounds. If new patient transition needs arise, please place a TOC consult.

## 2022-10-26 NOTE — Plan of Care (Signed)
  Problem: Education: Goal: Ability to describe self-care measures that may prevent or decrease complications (Diabetes Survival Skills Education) will improve Outcome: Progressing Goal: Individualized Educational Video(s) Outcome: Progressing   Problem: Coping: Goal: Ability to adjust to condition or change in health will improve Outcome: Progressing   Problem: Fluid Volume: Goal: Ability to maintain a balanced intake and output will improve Outcome: Progressing   Problem: Health Behavior/Discharge Planning: Goal: Ability to identify and utilize available resources and services will improve Outcome: Progressing Goal: Ability to manage health-related needs will improve Outcome: Progressing   Problem: Metabolic: Goal: Ability to maintain appropriate glucose levels will improve Outcome: Progressing   Problem: Nutritional: Goal: Maintenance of adequate nutrition will improve Outcome: Progressing Goal: Progress toward achieving an optimal weight will improve Outcome: Progressing   Problem: Skin Integrity: Goal: Risk for impaired skin integrity will decrease Outcome: Progressing   Problem: Tissue Perfusion: Goal: Adequacy of tissue perfusion will improve Outcome: Progressing   Problem: Education: Goal: Ability to verbalize activity precautions or restrictions will improve Outcome: Progressing Goal: Knowledge of the prescribed therapeutic regimen will improve Outcome: Progressing Goal: Understanding of discharge needs will improve Outcome: Progressing   Problem: Activity: Goal: Ability to avoid complications of mobility impairment will improve Outcome: Progressing Goal: Ability to tolerate increased activity will improve Outcome: Progressing Goal: Will remain free from falls Outcome: Progressing   Problem: Bowel/Gastric: Goal: Gastrointestinal status for postoperative course will improve Outcome: Progressing   Problem: Clinical Measurements: Goal: Ability to  maintain clinical measurements within normal limits will improve Outcome: Progressing Goal: Postoperative complications will be avoided or minimized Outcome: Progressing Goal: Diagnostic test results will improve Outcome: Progressing   Problem: Pain Management: Goal: Pain level will decrease Outcome: Progressing   Problem: Skin Integrity: Goal: Will show signs of wound healing Outcome: Progressing   Problem: Health Behavior/Discharge Planning: Goal: Identification of resources available to assist in meeting health care needs will improve Outcome: Progressing   Problem: Bladder/Genitourinary: Goal: Urinary functional status for postoperative course will improve Outcome: Progressing   Problem: Education: Goal: Knowledge of General Education information will improve Description: Including pain rating scale, medication(s)/side effects and non-pharmacologic comfort measures Outcome: Progressing   Problem: Health Behavior/Discharge Planning: Goal: Ability to manage health-related needs will improve Outcome: Progressing   Problem: Clinical Measurements: Goal: Ability to maintain clinical measurements within normal limits will improve Outcome: Progressing Goal: Will remain free from infection Outcome: Progressing Goal: Diagnostic test results will improve Outcome: Progressing Goal: Respiratory complications will improve Outcome: Progressing Goal: Cardiovascular complication will be avoided Outcome: Progressing   Problem: Activity: Goal: Risk for activity intolerance will decrease Outcome: Progressing   Problem: Nutrition: Goal: Adequate nutrition will be maintained Outcome: Progressing   Problem: Coping: Goal: Level of anxiety will decrease Outcome: Progressing   Problem: Elimination: Goal: Will not experience complications related to bowel motility Outcome: Progressing Goal: Will not experience complications related to urinary retention Outcome: Progressing    Problem: Pain Managment: Goal: General experience of comfort will improve Outcome: Progressing   Problem: Safety: Goal: Ability to remain free from injury will improve Outcome: Progressing   Problem: Skin Integrity: Goal: Risk for impaired skin integrity will decrease Outcome: Progressing   

## 2022-10-27 LAB — GLUCOSE, CAPILLARY: Glucose-Capillary: 89 mg/dL (ref 70–99)

## 2022-10-27 MED ORDER — SENNA 8.6 MG PO TABS: 1.0000 | ORAL_TABLET | Freq: Two times a day (BID) | ORAL | 0 refills | Status: DC

## 2022-10-27 MED ORDER — METHOCARBAMOL 500 MG PO TABS: 500.0000 mg | ORAL_TABLET | Freq: Four times a day (QID) | ORAL | 0 refills | Status: DC | PRN

## 2022-10-27 MED ORDER — OXYCODONE HCL 5 MG PO TABS: 5.0000 mg | ORAL_TABLET | ORAL | 0 refills | Status: AC | PRN

## 2022-10-27 NOTE — Discharge Instructions (Addendum)
Your surgeon has performed an operation on your lumbar spine (low back) to relieve pressure on one or more nerves. Many times, patients feel better immediately after surgery and can "overdo it." Even if you feel well, it is important that you follow these activity guidelines. If you do not let your back heal properly from the surgery, you can increase the chance of return of your symptoms. The following are instructions to help in your recovery once you have been discharged from the hospital.   Activity    No bending, lifting, or twisting ("BLT"). Avoid lifting objects heavier than 10 pounds (gallon milk jug).  Where possible, avoid household activities that involve lifting, bending, pushing, or pulling such as laundry, vacuuming, grocery shopping, and childcare. Try to arrange for help from friends and family for these activities while your back heals.  Increase physical activity slowly as tolerated.  Taking short walks is encouraged, but avoid strenuous exercise. Do not jog, run, bicycle, lift weights, or participate in any other exercises unless specifically allowed by your doctor. Avoid prolonged sitting, including car rides.  Talk to your doctor before resuming sexual activity.  You should not drive until cleared by your doctor.  Until released by your doctor, you should not return to work or school.  You should rest at home and let your body heal.   You may shower two days after your surgery.  After showering, lightly dab your incision dry. Do not take a tub bath or go swimming for 3 weeks, or until approved by your doctor at your follow-up appointment.  If you smoke, we strongly recommend that you quit.  Smoking has been proven to interfere with normal healing in your back and will dramatically reduce the success rate of your surgery. Please contact [facility] (800-QUIT-NOW) and use the resources at www.[facility].com for assistance in stopping smoking.  Surgical Incision   If you have  a dressing on your incision, you may remove it three days after your surgery. Keep your incision area clean and dry.  If you have staples or stitches on your incision, you should have a follow up scheduled for removal. If you do not have staples or stitches, you will have steri-strips (small pieces of surgical tape) or Dermabond glue. The steri-strips/glue should begin to peel away within about a week (it is fine if the steri-strips fall off before then). If the strips are still in place one week after your surgery, you may gently remove them.  Diet            You may return to your usual diet. Be sure to stay hydrated.  When to Contact Us  Although your surgery and recovery will likely be uneventful, you may have some residual numbness, aches, and pains in your back and/or legs. This is normal and should improve in the next few weeks.  However, should you experience any of the following, contact us immediately: New numbness or weakness Pain that is progressively getting worse, and is not relieved by your pain medications or rest Bleeding, redness, swelling, pain, or drainage from surgical incision Chills or flu-like symptoms Fever greater than 101.0 F (38.3 C) Problems with bowel or bladder functions Difficulty breathing or shortness of breath Warmth, tenderness, or swelling in your calf  Contact Information During office hours (Monday-Friday 9 am to 5 pm), please call your physician at [phone] and ask for Sherri Dickerson After hours and weekends, please call [phone] and speak with the neurosurgeon on call For a   life-threatening emergency, call 911 

## 2022-10-27 NOTE — Progress Notes (Signed)
Nursing Discharge Note   Admit Date: 10/24/2022  Discharge date: 10/27/2022   Sherri Dickerson is to be discharged home per MD order.  AVS completed. Reviewed with patient and family at bedside. Highlighted copy provided for patient to take home.  Patient/caregiver able to verbalize understanding of discharge instructions. PIV removed. Patient stable upon discharge.   Discharge Instructions     Incentive spirometry RT   Complete by: As directed    Remove dressing in 24 hours   Complete by: As directed       Allergies as of 10/27/2022       Reactions   Liraglutide Other (See Comments)   History of thyroid cancer   Macrobid [nitrofurantoin] Other (See Comments)   Flu like symptoms   Morphine And Related Other (See Comments)   Severe head aches.   Wellbutrin [bupropion] Other (See Comments)   Suicidal thoughts        Medication List     TAKE these medications    Calcium 200 MG Tabs Take 200 mg by mouth daily.   celecoxib 100 MG capsule Commonly known as: CELEBREX Take 100 mg by mouth 2 (two) times daily.   citalopram 10 MG tablet Commonly known as: CELEXA Take 20 mg by mouth daily.   fenofibrate 48 MG tablet Commonly known as: TRICOR Take 48 mg by mouth daily.   Fifty50 Glucose Meter 2.0 w/Device Kit Check fasting sugar once daily.  E11.9   gabapentin 600 MG tablet Commonly known as: NEURONTIN Take 900 mg by mouth at bedtime. 1.5 tablets at bedtime   levothyroxine 137 MCG tablet Commonly known as: SYNTHROID Take 137 mcg by mouth daily before breakfast.   lisinopril-hydrochlorothiazide 10-12.5 MG tablet Commonly known as: ZESTORETIC TK 1 T PO QD   MAGNESIUM CARBONATE PO Take 1 tablet by mouth daily.   Melatonin 10 MG Caps Take 10 mg by mouth at bedtime.   metFORMIN 500 MG tablet Commonly known as: GLUCOPHAGE 1,000 mg daily with supper.   methocarbamol 500 MG tablet Commonly known as: ROBAXIN Take 1 tablet (500 mg total) by mouth every 6 (six) hours  as needed for muscle spasms.   Multi-Vitamins Tabs Take 1 tablet by mouth daily.   nortriptyline 50 MG capsule Commonly known as: PAMELOR Take 50 mg by mouth at bedtime.   oxyCODONE 5 MG immediate release tablet Commonly known as: Oxy IR/ROXICODONE Take 1-2 tablets (5-10 mg total) by mouth every 4 (four) hours as needed for up to 5 days for moderate pain ((score 4 to 6)).   RA Vitamin B-12 TR 1000 MCG Tbcr Generic drug: Cyanocobalamin Take by mouth every morning.   RABEprazole 20 MG tablet Commonly known as: ACIPHEX 20 mg in the morning and at bedtime.   rosuvastatin 40 MG tablet Commonly known as: CRESTOR Take 40 mg by mouth daily.   senna 8.6 MG Tabs tablet Commonly known as: SENOKOT Take 1 tablet (8.6 mg total) by mouth 2 (two) times daily.   traZODone 100 MG tablet Commonly known as: DESYREL Take 100 mg by mouth at bedtime.   venlafaxine XR 150 MG 24 hr capsule Commonly known as: EFFEXOR-XR Take 150 mg by mouth daily with breakfast.   Vitamin C 100 MG Chew Chew 100 mg by mouth daily.   Vitamin D (Ergocalciferol) 50 MCG (2000 UT) Caps Take by mouth.   Zinc Picolinate Powd Take 50 mg by mouth daily.         Discharge Instructions/ Education: Discharge instructions given to patient/family  with verbalized understanding. Discharge education completed with patient/family including: follow up instructions, medication list, discharge activities, and limitations if indicated.  Patient instructed to return to Emergency Department, call 911, or call MD for any changes in condition.  Patient escorted via wheelchair to lobby and discharged home via private automobile.

## 2022-10-27 NOTE — Progress Notes (Signed)
Progress Note  History: Sherri Dickerson is s/p L2-3 XLIF and L2-S2 fusion.   POD3: Pt doing well this morning and eager to go home POD2: Pt without complaints this morning POD1: Pt doing well this morning with minimal complaints. Reports good pain control  Physical Exam: Vitals:   10/26/22 1535 10/26/22 2257  BP: (!) 141/64 (!) 123/58  Pulse: 97 91  Resp: 17 16  Temp: 98.9 F (37.2 C) 98.3 F (36.8 C)  SpO2: 98% 96%    AA Ox3 CNI Incision covered with wound vac Strength:5/5 throughout BLE HV: 60 over the last 24 hours  Data:  Other tests/results: none  Assessment/Plan:  Sherri Dickerson is a 62 y.o presenting with sagittal plane imbalance, chronic bilateral low back pain with left-sided sciatica and pseudoarthrosis s/p lumbar fusion.   - mobilize - pain control - DVT prophylaxis - PTOT - Will remove right HV this morning.  - dispo planning underway  Cooper Render PA-C Department of Neurosurgery

## 2022-10-27 NOTE — Progress Notes (Signed)
Neurosurgery PA Derrill Kay at bedside. Removed hemovac drain and Wound Vac at this time. Patient tolerated well. Dressing applied at both sites.

## 2022-10-27 NOTE — Plan of Care (Signed)
  Problem: Education: Goal: Ability to describe self-care measures that may prevent or decrease complications (Diabetes Survival Skills Education) will improve Outcome: Progressing Goal: Individualized Educational Video(s) Outcome: Progressing   Problem: Coping: Goal: Ability to adjust to condition or change in health will improve Outcome: Progressing   Problem: Fluid Volume: Goal: Ability to maintain a balanced intake and output will improve Outcome: Progressing   Problem: Health Behavior/Discharge Planning: Goal: Ability to identify and utilize available resources and services will improve Outcome: Progressing Goal: Ability to manage health-related needs will improve Outcome: Progressing   Problem: Metabolic: Goal: Ability to maintain appropriate glucose levels will improve Outcome: Progressing   Problem: Nutritional: Goal: Maintenance of adequate nutrition will improve Outcome: Progressing Goal: Progress toward achieving an optimal weight will improve Outcome: Progressing   Problem: Skin Integrity: Goal: Risk for impaired skin integrity will decrease Outcome: Progressing   Problem: Tissue Perfusion: Goal: Adequacy of tissue perfusion will improve Outcome: Progressing   Problem: Education: Goal: Ability to verbalize activity precautions or restrictions will improve Outcome: Progressing Goal: Knowledge of the prescribed therapeutic regimen will improve Outcome: Progressing Goal: Understanding of discharge needs will improve Outcome: Progressing   Problem: Activity: Goal: Ability to avoid complications of mobility impairment will improve Outcome: Progressing Goal: Ability to tolerate increased activity will improve Outcome: Progressing Goal: Will remain free from falls Outcome: Progressing   Problem: Bowel/Gastric: Goal: Gastrointestinal status for postoperative course will improve Outcome: Progressing   Problem: Clinical Measurements: Goal: Ability to  maintain clinical measurements within normal limits will improve Outcome: Progressing Goal: Postoperative complications will be avoided or minimized Outcome: Progressing Goal: Diagnostic test results will improve Outcome: Progressing   Problem: Pain Management: Goal: Pain level will decrease Outcome: Progressing   Problem: Skin Integrity: Goal: Will show signs of wound healing Outcome: Progressing   Problem: Health Behavior/Discharge Planning: Goal: Identification of resources available to assist in meeting health care needs will improve Outcome: Progressing   Problem: Bladder/Genitourinary: Goal: Urinary functional status for postoperative course will improve Outcome: Progressing   Problem: Education: Goal: Knowledge of General Education information will improve Description: Including pain rating scale, medication(s)/side effects and non-pharmacologic comfort measures Outcome: Progressing   Problem: Health Behavior/Discharge Planning: Goal: Ability to manage health-related needs will improve Outcome: Progressing   Problem: Clinical Measurements: Goal: Ability to maintain clinical measurements within normal limits will improve Outcome: Progressing Goal: Will remain free from infection Outcome: Progressing Goal: Diagnostic test results will improve Outcome: Progressing Goal: Respiratory complications will improve Outcome: Progressing Goal: Cardiovascular complication will be avoided Outcome: Progressing   Problem: Activity: Goal: Risk for activity intolerance will decrease Outcome: Progressing   Problem: Nutrition: Goal: Adequate nutrition will be maintained Outcome: Progressing   Problem: Coping: Goal: Level of anxiety will decrease Outcome: Progressing   Problem: Elimination: Goal: Will not experience complications related to bowel motility Outcome: Progressing Goal: Will not experience complications related to urinary retention Outcome: Progressing    Problem: Pain Managment: Goal: General experience of comfort will improve Outcome: Progressing   Problem: Safety: Goal: Ability to remain free from injury will improve Outcome: Progressing   Problem: Skin Integrity: Goal: Risk for impaired skin integrity will decrease Outcome: Progressing   

## 2022-10-27 NOTE — Discharge Summary (Signed)
Discharge Summary  Patient ID: Sherri Dickerson MRN: [id number] DOB/AGE: 1961/07/06 62 y.o.  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  Her intraoperative course was uncomplicated and she was admitted for therapy evaluation, drain output monitoring, and pain control.  She did well with therapy and was deemed appropriate for discharge home.  Her drains were removed on 3/13 and [id number] without complications.  Her incision remained intact throughout her hospital stay.  She was discharged home on postop day 3 with medications for pain, muscle relaxer, and stool softener with plan to follow-up outpatient in about 2 weeks.  Consults: None  Significant Diagnostic Studies: none  Treatments: surgery: as above. Please see separately dictated operative report for further details.   Discharge Exam: Blood pressure 120/65, pulse 87, temperature 98.2 F (36.8 C), resp. rate 18, height '5\' 6"'$  (1.676 m), weight 102.1 kg, SpO2 97 %. AA Ox3 CNI Incision covered with wound vac Strength:5/5 throughout BLE Incision c/d/I   Disposition: Discharge disposition: 01-Home or Self Care       Discharge Instructions     Incentive spirometry RT   Complete by: As directed    Remove dressing in 24 hours   Complete by: As directed       Allergies as of 10/27/2022        Reactions   Liraglutide Other (See Comments)   History of thyroid cancer   Macrobid [nitrofurantoin] Other (See Comments)   Flu like symptoms   Morphine And Related Other (See Comments)   Severe head aches.   Wellbutrin [bupropion] Other (See Comments)   Suicidal thoughts        Medication List     TAKE these medications    Calcium 200 MG Tabs Take 200 mg by mouth daily.   celecoxib 100 MG capsule Commonly known as: CELEBREX Take 100 mg by mouth 2 (two) times daily.   citalopram 10 MG tablet Commonly known as: CELEXA Take 20 mg by mouth daily.   fenofibrate 48 MG tablet Commonly known as: TRICOR Take 48 mg by mouth daily.   Fifty50 Glucose Meter 2.0 w/Device Kit Check fasting sugar once daily.  E11.9   gabapentin 600 MG tablet Commonly known as: NEURONTIN Take 900 mg by mouth at bedtime. 1.5 tablets at bedtime   levothyroxine 137 MCG tablet Commonly known as: SYNTHROID Take 137 mcg by mouth daily before breakfast.   lisinopril-hydrochlorothiazide 10-12.5 MG tablet Commonly known as: ZESTORETIC TK 1 T PO QD   MAGNESIUM CARBONATE PO Take 1 tablet by mouth daily.   Melatonin 10 MG Caps Take 10 mg by mouth at bedtime.   metFORMIN 500 MG tablet Commonly known as: GLUCOPHAGE 1,000 mg daily with supper.   methocarbamol 500 MG tablet Commonly known as: ROBAXIN Take 1 tablet (500 mg total) by mouth every 6 (six) hours as needed for muscle spasms.   Multi-Vitamins Tabs Take 1 tablet by mouth daily.   nortriptyline 50  MG capsule Commonly known as: PAMELOR Take 50 mg by mouth at bedtime.   oxyCODONE 5 MG immediate release tablet Commonly known as: Oxy IR/ROXICODONE Take 1-2 tablets (5-10 mg total) by mouth every 4 (four) hours as needed for up to 5 days for moderate pain ((score 4 to 6)).   RA Vitamin B-12 TR 1000 MCG Tbcr Generic drug: Cyanocobalamin Take by mouth every morning.   RABEprazole 20 MG tablet Commonly known as: ACIPHEX 20 mg in  the morning and at bedtime.   rosuvastatin 40 MG tablet Commonly known as: CRESTOR Take 40 mg by mouth daily.   senna 8.6 MG Tabs tablet Commonly known as: SENOKOT Take 1 tablet (8.6 mg total) by mouth 2 (two) times daily.   traZODone 100 MG tablet Commonly known as: DESYREL Take 100 mg by mouth at bedtime.   venlafaxine XR 150 MG 24 hr capsule Commonly known as: EFFEXOR-XR Take 150 mg by mouth daily with breakfast.   Vitamin C 100 MG Chew Chew 100 mg by mouth daily.   Vitamin D (Ergocalciferol) 50 MCG (2000 UT) Caps Take by mouth.   Zinc Picolinate Powd Take 50 mg by mouth daily.        Follow-up Information     Loleta Dicker, PA Follow up on 11/08/2022.   Specialty: Neurosurgery Contact information: [address] [place] [address] [phone]                 Signed: Loleta Dicker 10/27/2022, 9:01 AM

## 2022-11-07 ENCOUNTER — Emergency Department
Admission: EM | Admit: 2022-11-07 | Discharge: 2022-11-07 | Disposition: A | Discharge status: Home / Self Care | Payer: BC Managed Care – PPO | Attending: Emergency Medicine | Admitting: Emergency Medicine

## 2022-11-07 ENCOUNTER — Other Ambulatory Visit: Payer: Self-pay

## 2022-11-07 DIAGNOSIS — N390 Urinary tract infection, site not specified: Secondary | ICD-10-CM

## 2022-11-07 DIAGNOSIS — R350 Frequency of micturition: Secondary | ICD-10-CM | POA: Diagnosis present

## 2022-11-07 LAB — URINALYSIS, ROUTINE W REFLEX MICROSCOPIC
Bacteria, UA: NONE SEEN
Bilirubin Urine: NEGATIVE
Glucose, UA: NEGATIVE mg/dL
Hgb urine dipstick: NEGATIVE
Ketones, ur: NEGATIVE mg/dL
Nitrite: POSITIVE — AB
Protein, ur: >=300 mg/dL — AB
Specific Gravity, Urine: 1.018 (ref 1.005–1.030)
WBC, UA: >50 WBC/hpf (ref 0–5)
pH: 7 (ref 5.0–8.0)

## 2022-11-07 MED ORDER — CEFDINIR 300 MG PO CAPS
300.0000 mg | ORAL_CAPSULE | Freq: Once | ORAL | Status: AC
  Administered 2022-11-07: 300 mg via ORAL
  Filled 2022-11-07: qty 1

## 2022-11-07 MED ORDER — KETOROLAC TROMETHAMINE 10 MG PO TABS: 10.0000 mg | ORAL_TABLET | Freq: Four times a day (QID) | ORAL | 0 refills | Status: DC | PRN

## 2022-11-07 MED ORDER — CEFDINIR 300 MG PO CAPS: 300.0000 mg | ORAL_CAPSULE | Freq: Two times a day (BID) | ORAL | 0 refills | Status: AC

## 2022-11-07 MED ORDER — KETOROLAC TROMETHAMINE 30 MG/ML IJ SOLN
30.0000 mg | Freq: Once | INTRAMUSCULAR | Status: AC
  Administered 2022-11-07: 30 mg via INTRAMUSCULAR
  Filled 2022-11-07: qty 1

## 2022-11-07 NOTE — ED Notes (Signed)
Pt A&O x4, no obvious distress noted, respirations regular/unlabored. Pt verbalizes understanding of discharge instructions. Pt able to ambulate from ED independently.   

## 2022-11-07 NOTE — ED Provider Notes (Signed)
[facility] Provider Note   Event Date/Time   First MD Initiated Contact with Patient 11/07/22 2143     (approximate) History  Urinary Frequency  HPI Sherri Dickerson is a 62 y.o. female with recent spinal fusion surgery who presents complaining of suprapubic abdominal pain with dysuria that began 24 hours prior to arrival and is associated with increased frequency as well.  Patient also states that she is having lumbar back pain that is been stable postsurgically.  Patient denies any urinary hesitancy or incontinence.  Patient denies any abnormal drainage from recent surgical incision site ROS: Patient currently denies any vision changes, tinnitus, difficulty speaking, facial droop, sore throat, chest pain, shortness of breath, nausea/vomiting/diarrhea, or weakness/numbness/paresthesias in any extremity   Physical Exam  Triage Vital Signs: ED Triage Vitals [11/07/22 2139]  Enc Vitals Group     BP 139/79     Pulse Rate (!) 115     Resp 18     Temp 97.9 F (36.6 C)     Temp src      SpO2 97 %     Weight 225 lb (102.1 kg)     Height 5\' 6"  (1.676 m)     Head Circumference      Peak Flow      Pain Score 10     Pain Loc      Pain Edu?      Excl. in [place]?    Most recent vital signs: Vitals:   11/07/22 2139  BP: 139/79  Pulse: (!) 115  Resp: 18  Temp: 97.9 F (36.6 C)  SpO2: 97%   General: Awake, oriented x4. CV:  Good peripheral perfusion.  Resp:  Normal effort.  Abd:  No distention.  Suprapubic tenderness to palpation Other:  Obese middle-aged Caucasian female laying in bed in no acute distress.  Well-healing midline lumbar spinal surgical scar without surrounding erythema or induration and no active drainage ED Results / Procedures / Treatments  Labs (all labs ordered are listed, but only abnormal results are displayed) Labs Reviewed  URINALYSIS, ROUTINE W REFLEX MICROSCOPIC - Abnormal; Notable for the following components:      Result Value    Color, Urine AMBER (*)    APPearance HAZY (*)    Protein, ur >=300 (*)    Nitrite POSITIVE (*)    Leukocytes,Ua TRACE (*)    All other components within normal limits   PROCEDURES: Critical Care performed: No .1-3 Lead EKG Interpretation  Performed by: Naaman Plummer, MD Authorized by: Naaman Plummer, MD     Interpretation: normal     ECG rate:  71   ECG rate assessment: normal     Rhythm: sinus rhythm     Ectopy: none     Conduction: normal    MEDICATIONS ORDERED IN ED: Medications  cefdinir (OMNICEF) capsule 300 mg (has no administration in time range)  ketorolac (TORADOL) 30 MG/ML injection 30 mg (30 mg Intramuscular Given 11/07/22 2211)   IMPRESSION / MDM / ASSESSMENT AND PLAN / ED COURSE  I reviewed the triage vital signs and the nursing notes.                             The patient is on the cardiac monitor to evaluate for evidence of arrhythmia and/or significant heart rate changes. Patient's presentation is most consistent with acute presentation with potential threat to life or bodily function. Not  Pregnant. Unlikely TOA, Ovarian Torsion, PID, gonorrhea/chlamydia. Low suspicion for Infected Urolithiasis, AAA, Cholecystitis, Pancreatitis, SBO, Appendicitis, or other acute abdomen.  Rx: Cefdinir 300 mg BID for 5 days Disposition: Discharge home. SRP discussed. Advise follow up with primary care provider within 24-72 hours.   FINAL CLINICAL IMPRESSION(S) / ED DIAGNOSES   Final diagnoses:  Urinary tract infection without hematuria, site unspecified   Rx / DC Orders   ED Discharge Orders          Ordered    cefdinir (OMNICEF) 300 MG capsule  2 times daily        11/07/22 2312    ketorolac (TORADOL) 10 MG tablet  Every 6 hours PRN       Note to Pharmacy: Patient given an IM/IV loading dose in emergency department   11/07/22 2313           Note:  This document was prepared using Dragon voice recognition software and may include unintentional dictation  errors.   Naaman Plummer, MD 11/07/22 [phone]

## 2022-11-07 NOTE — ED Triage Notes (Signed)
To triage via wheelchair with c/o urinary frequency and pain starting today.  Recently had back surgery x 2 weeks ago and is unsure if sx are related. Spinal fusion L2 to sacrum.

## 2022-11-08 ENCOUNTER — Encounter: Payer: Self-pay | Admitting: Neurosurgery

## 2022-11-08 ENCOUNTER — Ambulatory Visit (INDEPENDENT_AMBULATORY_CARE_PROVIDER_SITE_OTHER): Payer: BC Managed Care – PPO | Admitting: Neurosurgery

## 2022-11-08 VITALS — BP 124/80 | HR 92 | Temp 98.1°F | Ht 66.0 in | Wt 225.0 lb

## 2022-11-08 DIAGNOSIS — M5442 Lumbago with sciatica, left side: Secondary | ICD-10-CM

## 2022-11-08 DIAGNOSIS — Z09 Encounter for follow-up examination after completed treatment for conditions other than malignant neoplasm: Secondary | ICD-10-CM

## 2022-11-08 DIAGNOSIS — M438X9 Other specified deforming dorsopathies, site unspecified: Secondary | ICD-10-CM

## 2022-11-08 DIAGNOSIS — M960 Pseudarthrosis after fusion or arthrodesis: Secondary | ICD-10-CM

## 2022-11-08 DIAGNOSIS — Z981 Arthrodesis status: Secondary | ICD-10-CM

## 2022-11-08 DIAGNOSIS — G8929 Other chronic pain: Secondary | ICD-10-CM

## 2022-11-08 DIAGNOSIS — T84498D Other mechanical complication of other internal orthopedic devices, implants and grafts, subsequent encounter: Secondary | ICD-10-CM

## 2022-11-08 NOTE — Progress Notes (Signed)
   REFERRING PHYSICIAN:  Donnamarie Rossetti, [place],  [place] [address]  DOS: 10/24/22 L2-3 XLIF, L2-S2 PSF. Left L5-S1 foraminotomy. L2-3 PCO  HISTORY OF PRESENT ILLNESS: Sherri Dickerson is approximately 2 weeks status post lumbar decompression fusion. she is doing very well postoperatively.  She does have some numbness and tingling in her right thigh but states that this has improved postoperatively.  She did have dysuria and was seen in the ER on 02/07/2023 and given antibiotics for 5 days for this.  Otherwise she is doing well.  She is currently controlling her pain largely with Tylenol.  PHYSICAL EXAMINATION:  General: Patient is well developed, well nourished, calm, collected, and in no apparent distress.   NEUROLOGICAL:  General: In no acute distress.   Awake, alert, oriented to person, place, and time.  Pupils equal round and reactive to light.  Facial tone is symmetric.  Tongue protrusion is midline.  There is no pronator drift.   Strength:            Side Iliopsoas Quads Hamstring PF DF EHL  R 5 5 5 5 5 5   L 5 5 5 5 5 5    Incisions c/d/i and healing well.  There are areas of scabbing but no obvious signs of infection or wound dehiscence.   ROS (Neurologic):  Negative except as noted above  IMAGING: No interval imaging to review  ASSESSMENT/PLAN:  Sherri Dickerson is doing well approximately 2 weeks after lumbar decompression and fusion.  Overall she is doing very well postoperatively despite some continued back pain and numbness and tingling in her right thigh.  We discussed activity escalation and I have advised the patient to lift up to 10 pounds until 6 weeks after surgery, then increase up to 25 pounds until 12 weeks after surgery.  After 12 weeks post-op, the patient advised to increase activity as tolerated. she will follow up with Dr. Izora Ribas in 4 weeks with lumbar x-rays prior.  She was encouraged to call the office in the interim should she  have any questions or concerns.  She expressed understanding was in agreement with this plan.  Cooper Render PA-C Department of neurosurgery

## 2022-12-05 ENCOUNTER — Other Ambulatory Visit: Payer: Self-pay

## 2022-12-05 DIAGNOSIS — M48061 Spinal stenosis, lumbar region without neurogenic claudication: Secondary | ICD-10-CM

## 2022-12-05 DIAGNOSIS — G8929 Other chronic pain: Secondary | ICD-10-CM

## 2022-12-06 ENCOUNTER — Ambulatory Visit
Admission: RE | Admit: 2022-12-06 | Discharge: 2022-12-06 | Disposition: A | Discharge status: Home / Self Care | Payer: BC Managed Care – PPO | Source: Ambulatory Visit | Attending: Neurosurgery | Admitting: Neurosurgery

## 2022-12-06 ENCOUNTER — Ambulatory Visit (INDEPENDENT_AMBULATORY_CARE_PROVIDER_SITE_OTHER): Payer: BC Managed Care – PPO | Admitting: Neurosurgery

## 2022-12-06 ENCOUNTER — Encounter: Payer: Self-pay | Admitting: Neurosurgery

## 2022-12-06 VITALS — BP 130/80 | Temp 98.1°F | Ht 66.0 in | Wt 225.0 lb

## 2022-12-06 DIAGNOSIS — G8929 Other chronic pain: Secondary | ICD-10-CM | POA: Diagnosis present

## 2022-12-06 DIAGNOSIS — Z09 Encounter for follow-up examination after completed treatment for conditions other than malignant neoplasm: Secondary | ICD-10-CM

## 2022-12-06 DIAGNOSIS — M48061 Spinal stenosis, lumbar region without neurogenic claudication: Secondary | ICD-10-CM | POA: Insufficient documentation

## 2022-12-06 DIAGNOSIS — M960 Pseudarthrosis after fusion or arthrodesis: Secondary | ICD-10-CM

## 2022-12-06 DIAGNOSIS — M5442 Lumbago with sciatica, left side: Secondary | ICD-10-CM | POA: Insufficient documentation

## 2022-12-06 DIAGNOSIS — M438X9 Other specified deforming dorsopathies, site unspecified: Secondary | ICD-10-CM

## 2022-12-06 NOTE — Progress Notes (Signed)
   REFERRING PHYSICIAN:  Wilford Corner, Pa-c [address] [place],  [place] [address]  DOS: 10/24/22 L2-3 XLIF, L2-S2 PSF. Left L5-S1 foraminotomy. L2-3 PCO  HISTORY OF PRESENT ILLNESS: Sherri Dickerson is status post lumbar decompression fusion. she is doing very well postoperatively.     PHYSICAL EXAMINATION:  General: Patient is well developed, well nourished, calm, collected, and in no apparent distress.   NEUROLOGICAL:  General: In no acute distress.   Awake, alert, oriented to person, place, and time.  Pupils equal round and reactive to light.  Facial tone is symmetric.  Tongue protrusion is midline.  There is no pronator drift.   Strength:            Side Iliopsoas Quads Hamstring PF DF EHL  R L Incisions c/d/i and healing well.  There are areas of scabbing but no obvious signs of infection or wound dehiscence.   ROS (Neurologic):  Negative except as noted above  IMAGING: No complications noted  ASSESSMENT/PLAN:  Sherri Dickerson is doing well after lumbar decompression and fusion.  Overall she is doing very well postoperatively though she has some continued pain.  I think she is making good progress.  We reviewed her activity limitations.  I will see her back in clinic in approximately 6 weeks.    She is not ready to return to work.  Venetia Night Department of neurosurgery

## 2022-12-29 ENCOUNTER — Other Ambulatory Visit: Payer: Self-pay

## 2022-12-29 DIAGNOSIS — K760 Fatty (change of) liver, not elsewhere classified: Secondary | ICD-10-CM

## 2023-01-10 ENCOUNTER — Ambulatory Visit
Admission: RE | Admit: 2023-01-10 | Discharge: 2023-01-10 | Disposition: A | Discharge status: Home / Self Care | Payer: BC Managed Care – PPO | Source: Ambulatory Visit | Attending: Family Medicine | Admitting: Family Medicine

## 2023-01-10 DIAGNOSIS — K760 Fatty (change of) liver, not elsewhere classified: Secondary | ICD-10-CM | POA: Diagnosis present

## 2023-01-11 ENCOUNTER — Other Ambulatory Visit: Payer: Self-pay

## 2023-01-11 DIAGNOSIS — G8929 Other chronic pain: Secondary | ICD-10-CM

## 2023-01-12 ENCOUNTER — Ambulatory Visit (INDEPENDENT_AMBULATORY_CARE_PROVIDER_SITE_OTHER): Payer: BC Managed Care – PPO | Admitting: Neurosurgery

## 2023-01-12 ENCOUNTER — Encounter: Payer: Self-pay | Admitting: Neurosurgery

## 2023-01-12 ENCOUNTER — Ambulatory Visit
Admission: RE | Admit: 2023-01-12 | Discharge: 2023-01-12 | Disposition: A | Discharge status: Home / Self Care | Payer: BC Managed Care – PPO | Source: Ambulatory Visit | Attending: Neurosurgery | Admitting: Neurosurgery

## 2023-01-12 VITALS — BP 130/82 | Temp 98.2°F | Ht 66.0 in | Wt 225.0 lb

## 2023-01-12 DIAGNOSIS — G8929 Other chronic pain: Secondary | ICD-10-CM

## 2023-01-12 DIAGNOSIS — M5442 Lumbago with sciatica, left side: Secondary | ICD-10-CM | POA: Insufficient documentation

## 2023-01-12 DIAGNOSIS — Z981 Arthrodesis status: Secondary | ICD-10-CM

## 2023-01-12 DIAGNOSIS — M48061 Spinal stenosis, lumbar region without neurogenic claudication: Secondary | ICD-10-CM

## 2023-01-12 NOTE — Progress Notes (Signed)
   REFERRING PHYSICIAN:  Wilford Corner, Pa-c [address] [place],  [place] [address]  DOS: 10/24/22 L2-3 XLIF, L2-S2 PSF. Left L5-S1 foraminotomy. L2-3 PCO  HISTORY OF PRESENT ILLNESS:  Sherri Dickerson is a 62 year old presenting today for 6-week follow-up after lumbar decompression and fusion.  She has since returned to work which is caused some increase in her back pain last week however she states it has improved some this week.  She also describes some pelvic discomfort particularly with sitting for long periods of time.  She is otherwise doing well and reports overall improvement from her preoperative symptoms.  LOV Dr. Myer Haff 12/06/22 Keylah B Stokke is status post lumbar decompression fusion. she is doing very well postoperatively.     PHYSICAL EXAMINATION:  General: Patient is well developed, well nourished, calm, collected, and in no apparent distress.   NEUROLOGICAL:  General: In no acute distress.   Awake, alert, oriented to person, place, and time.  Pupils equal round and reactive to light.  Strength:            Side Iliopsoas Quads Hamstring PF DF EHL  R 5 5 5 5 5 5   L 5 5 5 5 5 5    Incisions well-healed  ROS (Neurologic):  Negative except as noted above  IMAGING: 01/11/22 lumbar xrays  Without evidence of hardware malfunction  ASSESSMENT/PLAN:  Arrington B Hallberg is doing well after lumbar decompression and fusion.  She is doing relatively well despite some continued pain in her back.  I expressed that this is likely getting back into her routine with work and to improve with time.  She can resume her normal activities as tolerated.  We will see her back in 3 months with lumbar x-rays prior.  She was encouraged to call the office in the interim should she have any questions or concerns.  She expressed understanding and was in agreement with this plan.  I spent a total of 25 minutes in both face-to-face and non-face-to-face activities for this visit on the  date of this encounter and review of imaging, discussion of symptoms, physical exam, and documentation.   Manning Charity PA-C Department of neurosurgery

## 2023-04-12 ENCOUNTER — Other Ambulatory Visit: Payer: Self-pay

## 2023-04-12 DIAGNOSIS — M48061 Spinal stenosis, lumbar region without neurogenic claudication: Secondary | ICD-10-CM

## 2023-04-12 DIAGNOSIS — G8929 Other chronic pain: Secondary | ICD-10-CM

## 2023-04-13 ENCOUNTER — Encounter: Payer: Self-pay | Admitting: Neurosurgery

## 2023-04-13 ENCOUNTER — Ambulatory Visit
Admission: RE | Admit: 2023-04-13 | Discharge: 2023-04-13 | Disposition: A | Discharge status: Home / Self Care | Payer: BC Managed Care – PPO | Attending: Neurosurgery | Admitting: Neurosurgery

## 2023-04-13 ENCOUNTER — Ambulatory Visit
Admission: RE | Admit: 2023-04-13 | Discharge: 2023-04-13 | Disposition: A | Discharge status: Home / Self Care | Payer: BC Managed Care – PPO | Source: Ambulatory Visit | Attending: Neurosurgery | Admitting: Neurosurgery

## 2023-04-13 ENCOUNTER — Ambulatory Visit (INDEPENDENT_AMBULATORY_CARE_PROVIDER_SITE_OTHER): Payer: BC Managed Care – PPO | Admitting: Neurosurgery

## 2023-04-13 VITALS — BP 130/82 | Ht 66.0 in | Wt 225.0 lb

## 2023-04-13 DIAGNOSIS — Z09 Encounter for follow-up examination after completed treatment for conditions other than malignant neoplasm: Secondary | ICD-10-CM | POA: Diagnosis not present

## 2023-04-13 DIAGNOSIS — M48061 Spinal stenosis, lumbar region without neurogenic claudication: Secondary | ICD-10-CM

## 2023-04-13 DIAGNOSIS — G8929 Other chronic pain: Secondary | ICD-10-CM | POA: Diagnosis present

## 2023-04-13 DIAGNOSIS — M5442 Lumbago with sciatica, left side: Secondary | ICD-10-CM | POA: Diagnosis present

## 2023-04-13 NOTE — Progress Notes (Signed)
   REFERRING PHYSICIAN:  No referring provider defined for this encounter.  DOS: 10/24/22 L2-3 XLIF, L2-S2 PSF. Left L5-S1 foraminotomy. L2-3 PCO  HISTORY OF PRESENT ILLNESS:  Sherri Dickerson is a 62 year old presenting today for follow-up after lumbar decompression and fusion.    She continues to have intermittent right-sided and left-sided pain in her back.  PHYSICAL EXAMINATION:  General: Patient is well developed, well nourished, calm, collected, and in no apparent distress.   NEUROLOGICAL:  General: In no acute distress.   Awake, alert, oriented to person, place, and time.  Pupils equal round and reactive to light.  Strength:            Side Iliopsoas Quads Hamstring PF DF EHL  R 5 5 5 5 5 5   L 5 5 5 5 5 5    Incisions well-healed  ROS (Neurologic):  Negative except as noted above  IMAGING: 01/11/22 lumbar xrays  Without evidence of hardware malfunction  April 13, 2023-stable  ASSESSMENT/PLAN:  Sherri Dickerson is doing well after lumbar decompression and fusion.  She continues to have some intermittent pain.  I am hopeful that this will continue to improve as she heals from surgery.  I would like to see her back in 6 months with x-rays.     I spent a total of 10 minutes in both face-to-face and non-face-to-face activities for this visit on the date of this encounter and review of imaging, discussion of symptoms, physical exam, and documentation.   Venetia Night MD Department of neurosurgery

## 2023-05-15 ENCOUNTER — Other Ambulatory Visit: Payer: Self-pay | Admitting: Neurosurgery

## 2023-05-16 NOTE — Telephone Encounter (Signed)
Robaxin sent to pharmacy. Please let her know.

## 2023-05-16 NOTE — Telephone Encounter (Signed)
I left a detailed message informing the patient her medication has been sent to the pharmacy.

## 2023-08-23 ENCOUNTER — Other Ambulatory Visit: Payer: Self-pay

## 2023-08-23 DIAGNOSIS — G8929 Other chronic pain: Secondary | ICD-10-CM

## 2023-08-24 ENCOUNTER — Ambulatory Visit: Payer: BC Managed Care – PPO | Admitting: Neurosurgery

## 2023-08-24 ENCOUNTER — Ambulatory Visit
Admission: RE | Admit: 2023-08-24 | Discharge: 2023-08-24 | Disposition: A | Discharge status: Home / Self Care | Payer: BC Managed Care – PPO | Source: Ambulatory Visit | Attending: Neurosurgery | Admitting: Neurosurgery

## 2023-08-24 ENCOUNTER — Encounter: Payer: Self-pay | Admitting: Neurosurgery

## 2023-08-24 ENCOUNTER — Ambulatory Visit
Admission: RE | Admit: 2023-08-24 | Discharge: 2023-08-24 | Disposition: A | Discharge status: Home / Self Care | Payer: BC Managed Care – PPO | Attending: Neurosurgery | Admitting: Neurosurgery

## 2023-08-24 VITALS — BP 166/94 | Ht 66.0 in | Wt 225.0 lb

## 2023-08-24 DIAGNOSIS — G8929 Other chronic pain: Secondary | ICD-10-CM

## 2023-08-24 DIAGNOSIS — G894 Chronic pain syndrome: Secondary | ICD-10-CM

## 2023-08-24 DIAGNOSIS — M48061 Spinal stenosis, lumbar region without neurogenic claudication: Secondary | ICD-10-CM

## 2023-08-24 DIAGNOSIS — M5442 Lumbago with sciatica, left side: Secondary | ICD-10-CM | POA: Diagnosis present

## 2023-08-24 NOTE — Progress Notes (Signed)
   REFERRING PHYSICIAN:  Cyrus Selinda Moose, Pa-c [address] [place],  [place] [address]  DOS: 10/24/22 L2-3 XLIF, L2-S2 PSF. Left L5-S1 foraminotomy. L2-3 PCO  HISTORY OF PRESENT ILLNESS:  Sherri Dickerson is a 63 year old presenting today for follow-up after lumbar decompression and fusion.  She continues to have pain.  She has difficulty with staying in 1 position for an extended length of time.  She did well when she was off work over the holidays, but has had trouble since that time.  Her work is greatly exacerbating her pain.   PHYSICAL EXAMINATION:  General: Patient is well developed, well nourished, calm, collected, and in no apparent distress.   NEUROLOGICAL:  General: In no acute distress.   Awake, alert, oriented to person, place, and time.  Pupils equal round and reactive to light.  Strength:            Side Iliopsoas Quads Hamstring PF DF EHL  R 5 5 5 5 5 5   L 4+ 5 5 5 5 5    Incisions well-healed  ROS (Neurologic):  Negative except as noted above  IMAGING: 01/11/22 lumbar xrays  Without evidence of hardware malfunction  April 13, 2023-stable  Aug 24, 2023 - stable  ASSESSMENT/PLAN:  Sherri Dickerson is doing fair after lumbar decompression and fusion.  I think she should continue her spinal cord stimulator placement.  I have given her information.  If she elects she would like to do that, we will refer her for thoracic MRI scan, psychology evaluation, and to pain management for stimulator evaluation.    I spent a total of 10 minutes in both face-to-face and non-face-to-face activities for this visit on the date of this encounter and review of imaging, discussion of symptoms, physical exam, and documentation.   Reeves Daisy MD Department of neurosurgery

## 2023-09-10 ENCOUNTER — Other Ambulatory Visit: Payer: Self-pay | Admitting: Orthopedic Surgery

## 2023-09-12 ENCOUNTER — Telehealth: Payer: Self-pay | Admitting: Neurosurgery

## 2023-09-12 ENCOUNTER — Other Ambulatory Visit: Payer: Self-pay

## 2023-09-12 DIAGNOSIS — G8929 Other chronic pain: Secondary | ICD-10-CM

## 2023-09-12 DIAGNOSIS — G894 Chronic pain syndrome: Secondary | ICD-10-CM

## 2023-09-12 NOTE — Telephone Encounter (Signed)
Per Dr. Lucienne Capers last note, "If she elects she would like to do that, we will refer her for thoracic MRI scan, psychology evaluation, and to pain management for stimulator evaluation".

## 2023-09-12 NOTE — Telephone Encounter (Signed)
I called patient to let her know that the next steps would be getting an MRI of the thoracic spine, seeing psych for evaluation, and seeing the pain management for evaluation for Spinal Cord Stimulator.

## 2023-09-12 NOTE — Telephone Encounter (Signed)
Patient is calling to let our office know that she would like to proceed with getting a SCS.

## 2023-09-13 NOTE — Telephone Encounter (Signed)
Emailed information to patient- she could not get into [employer]

## 2023-09-27 ENCOUNTER — Telehealth: Payer: Self-pay | Admitting: Neurosurgery

## 2023-09-27 NOTE — Telephone Encounter (Signed)
Error

## 2023-10-02 ENCOUNTER — Ambulatory Visit
Admission: RE | Admit: 2023-10-02 | Discharge: 2023-10-02 | Disposition: A | Discharge status: Home / Self Care | Payer: BC Managed Care – PPO | Source: Ambulatory Visit | Attending: Neurosurgery | Admitting: Neurosurgery

## 2023-10-02 DIAGNOSIS — G8929 Other chronic pain: Secondary | ICD-10-CM | POA: Diagnosis present

## 2023-10-02 DIAGNOSIS — M5442 Lumbago with sciatica, left side: Secondary | ICD-10-CM | POA: Diagnosis present

## 2023-10-02 DIAGNOSIS — G894 Chronic pain syndrome: Secondary | ICD-10-CM | POA: Diagnosis present

## 2023-10-10 ENCOUNTER — Ambulatory Visit: Payer: BC Managed Care – PPO | Admitting: Student in an Organized Health Care Education/Training Program

## 2023-10-12 ENCOUNTER — Ambulatory Visit: Payer: BC Managed Care – PPO | Admitting: Neurosurgery

## 2023-10-26 ENCOUNTER — Ambulatory Visit
Admission: RE | Admit: 2023-10-26 | Discharge: 2023-10-26 | Disposition: A | Discharge status: Home / Self Care | Source: Ambulatory Visit | Attending: Student in an Organized Health Care Education/Training Program | Admitting: Student in an Organized Health Care Education/Training Program

## 2023-10-26 ENCOUNTER — Ambulatory Visit
Payer: BC Managed Care – PPO | Attending: Student in an Organized Health Care Education/Training Program | Admitting: Student in an Organized Health Care Education/Training Program

## 2023-10-26 ENCOUNTER — Encounter: Payer: Self-pay | Admitting: Student in an Organized Health Care Education/Training Program

## 2023-10-26 VITALS — BP 165/97 | HR 86 | Temp 97.5°F | Ht 66.0 in | Wt 230.0 lb

## 2023-10-26 DIAGNOSIS — M5416 Radiculopathy, lumbar region: Secondary | ICD-10-CM | POA: Diagnosis present

## 2023-10-26 DIAGNOSIS — G894 Chronic pain syndrome: Secondary | ICD-10-CM | POA: Diagnosis present

## 2023-10-26 DIAGNOSIS — M961 Postlaminectomy syndrome, not elsewhere classified: Secondary | ICD-10-CM | POA: Diagnosis present

## 2023-10-26 DIAGNOSIS — G8929 Other chronic pain: Secondary | ICD-10-CM

## 2023-10-26 NOTE — Progress Notes (Signed)
 Patient: Sherri Dickerson  Service Category: E/M  Provider: Edward Jolly, MD  DOB: 1961/04/06  DOS: 10/26/2023  Referring Provider: Venetia Night, MD  MRN: [id number]  Setting: Ambulatory outpatient  PCP: Wilford Corner, PA-C  Type: New Patient  Specialty: Interventional Pain Management    Location: Office  Delivery: Face-to-face     Primary Reason(s) for Visit: Encounter for initial evaluation of one or more chronic problems (new to examiner) potentially causing chronic pain, and posing a threat to normal musculoskeletal function. (Level of risk: High) CC: Back Pain (lower)  HPI  Sherri Dickerson is a 63 y.o. year old, female patient, who comes for the first time to our practice referred by Venetia Night, MD for our initial evaluation of her chronic pain. 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 Today she comes in for evaluation of her Back Pain (lower)  Pain Assessment: Location: Lower Back Radiating: denies Onset: More than a month ago Duration: Chronic pain Quality: Aching, Burning, Constant, Sharp Severity: 10-Worst pain ever/10 (subjective, self-reported pain score)  Effect on ADL: limits adls Timing: Constant Modifying factors: nothing BP: (!) 165/97  HR: 86  Onset and Duration: Present longer than 3 months Cause of pain: Unknown Severity: No change since onset, NAS-11 at its worse: 10/10, NAS-11 at its best: 5/10, and NAS-11 on the average: 10/10 Timing: Not influenced by the time of the day Aggravating Factors: Bending, Kneeling, Motion, Prolonged sitting, Prolonged standing, Stooping , Twisting, Walking, Walking uphill, and Working Alleviating  Factors:  nothing Associated Problems: Fatigue, Numbness, Tingling, and Weakness Quality of Pain: Aching, Burning, Constant, Pulsating, Sharp, Throbbing, Uncomfortable, and Work related Previous Examinations or Tests: Bone scan, CT scan, Endoscopy, MRI scan, and X-rays Previous Treatments: Epidural steroid injections, Narcotic medications, Physical Therapy, Relaxation therapy, Steroid treatments by mouth, Strengthening exercises, and Stretching exercises  Sherri Dickerson is being evaluated for possible interventional pain management therapies for the treatment of her chronic pain.    History of Present Illness   The patient presents with chronic low back pain radiating to the left leg. She was referred by Dr. Myer Haff for consideration of a spinal cord stimulator.  She experiences chronic low back pain that radiates to the left leg, often reaching the knee. The pain is described as a 'pulse' in the kneecap area and can extend down the leg but is usually concentrated around the knee.  She has a history of multiple spine surgeries, with the most recent one performed a year ago. Despite the surgery, she continues to experience pain. Surgical hx: 10/24/22 L2-3 XLIF, L2-S2 PSF. Left L5-S1 foraminotomy. L2-3 PCO   In February, during a visit to her primary care physician for an ear issue, she experienced severe leg pain and received a steroid shot, followed by a course of prednisone starting the next day.  She has undergone a psychological evaluation and a thoracic MRI in February for SCS planning, with no findings on the MRI that would contraindicate perc SCS trial.       Meds   Current Outpatient Medications:    Ascorbic Acid (VITAMIN C) 100 MG CHEW, Chew 100 mg by mouth daily., Disp: , Rfl:    Blood Glucose Monitoring Suppl (FIFTY50 GLUCOSE METER 2.0) w/Device KIT, Check fasting sugar  once daily.  E11.9, Disp: , Rfl:    Calcium 200 MG TABS, Take 200 mg by mouth daily., Disp: , Rfl:    celecoxib  (CELEBREX) 100 MG capsule, Take 100 mg by mouth 2 (two) times daily., Disp: , Rfl:    citalopram (CELEXA) 10 MG tablet, Take 20 mg by mouth daily., Disp: , Rfl:    Cyanocobalamin (RA VITAMIN B-12 TR) 1000 MCG TBCR, Take by mouth every morning. , Disp: , Rfl:    fenofibrate (TRICOR) 48 MG tablet, Take 48 mg by mouth daily., Disp: , Rfl:    gabapentin (NEURONTIN) 600 MG tablet, Take 900 mg by mouth at bedtime. 1.5 tablets at bedtime, Disp: , Rfl: 11   levothyroxine (SYNTHROID) 137 MCG tablet, Take 137 mcg by mouth daily before breakfast., Disp: , Rfl: 4   lisinopril-hydrochlorothiazide (PRINZIDE,ZESTORETIC) 10-12.5 MG tablet, TK 1 T PO QD, Disp: , Rfl: 4   MAGNESIUM CARBONATE PO, Take 1 tablet by mouth daily., Disp: , Rfl:    Melatonin 10 MG CAPS, Take 10 mg by mouth at bedtime., Disp: , Rfl:    metFORMIN (GLUCOPHAGE) 500 MG tablet, 1,000 mg daily with supper., Disp: , Rfl: 12   methocarbamol (ROBAXIN) 500 MG tablet, TAKE 1 TABLET(500 MG) BY MOUTH EVERY 6 HOURS AS NEEDED FOR MUSCLE SPASMS, Disp: 120 tablet, Rfl: 0   milk thistle 175 MG tablet, Take 175 mg by mouth daily., Disp: , Rfl:    Multiple Vitamin (MULTI-VITAMINS) TABS, Take 1 tablet by mouth daily. , Disp: , Rfl:    nortriptyline (PAMELOR) 50 MG capsule, Take 50 mg by mouth at bedtime., Disp: , Rfl:    penicillin v potassium (VEETID) 500 MG tablet, Take 500 mg by mouth 4 (four) times daily., Disp: , Rfl:    RABEprazole (ACIPHEX) 20 MG tablet, 20 mg in the morning and at bedtime., Disp: , Rfl: 0   rosuvastatin (CRESTOR) 40 MG tablet, Take 40 mg by mouth daily., Disp: , Rfl:    traZODone (DESYREL) 100 MG tablet, Take 100 mg by mouth at bedtime., Disp: , Rfl:    venlafaxine XR (EFFEXOR-XR) 150 MG 24 hr capsule, Take 150 mg by mouth daily with breakfast., Disp: , Rfl: 11   Vitamin D, Ergocalciferol, 2000 units CAPS, Take by mouth., Disp: , Rfl:    Zinc Picolinate POWD, Take 50 mg by mouth daily., Disp: , Rfl:   Imaging Review   MR THORACIC  SPINE WO CONTRAST  Narrative CLINICAL DATA:  Evaluation for stenosis prior to spinal cord stimulator  EXAM: MRI THORACIC SPINE WITHOUT CONTRAST  TECHNIQUE: Multiplanar, multisequence MR imaging of the thoracic spine was performed. No intravenous contrast was administered.  COMPARISON:  None Available.  FINDINGS: Alignment:  No significant listhesis.  Vertebrae: Vertebral body heights are maintained. No marrow edema. No suspicious lesion.  Cord:  No abnormal signal.  Paraspinal and other soft tissues: Unremarkable.  Disc levels:  Scattered small disc bulges and protrusions. For example, central protrusions at T4-T5, T5-T6, and T6-T7; right paracentral protrusion at T7-T8; left paracentral protrusions at T8-T9 and T9-T10. minor canal stenosis. Multilevel mild facet arthropathy. No high-grade foraminal stenosis.  IMPRESSION: Multilevel degenerative changes without high-grade stenosis.   Electronically Signed By: Guadlupe Spanish M.D. On: 10/24/2023 09:49   MR LUMBAR SPINE WO CONTRAST  Narrative CLINICAL DATA:  Lumbar radiculopathy  EXAM: MRI LUMBAR SPINE WITHOUT CONTRAST  TECHNIQUE: Multiplanar, multisequence MR imaging of the lumbar spine was performed. No intravenous contrast was administered.  COMPARISON:  MRI  L Spine 05/27/20  FINDINGS: Segmentation:  Standard.  Alignment: Mild retrolisthesis of L3 on L4 and L4 on L5, unchanged compared to prior exam.  Vertebrae: Postsurgical changes from L3-S1 posterior spinal fusion. No evidence of fracture. No discitis. No worrisome lesions.  Conus medullaris and cauda equina: Conus extends to the L1 level. Conus and cauda equina appear normal.  Paraspinal and other soft tissues: Negative.  Disc levels:  T12-L1: Minimal disc bulge. No spinal canal stenosis. Mild bilateral facet degenerative change. No neural foraminal stenosis.  L1-L2: Minimal disc bulge. Mild bilateral facet degenerative change. No spinal  canal stenosis. No neural foraminal stenosis.  L2-L3: Circumferential disc bulge. Short pedicles. Mild bilateral facet degenerative change. Ligamentum flavum hypertrophy. Moderate spinal canal narrowing. Mild left neural foraminal stenosis.  L3-L4: Fused level. Redemonstrated mild retrolisthesis. Posterior decompression. No significant spinal canal narrowing. Mild left neural foraminal narrowing.  L4-L5: Fused level. Status post posterior decompression. No significant disc bulge. No spinal canal stenosis. Mild bilateral neural foraminal narrowing.  L5-S1: Fused level. Status post posterior decompression. Ligamentum flavum hypertrophy. Circumferential disc bulge. Epidural lipomatosis. No bony spinal canal narrowing. There is moderate to severe narrowing of the thecal sac at this level. Severe left and moderate right neural foraminal narrowing, progressed compared to prior exam.  IMPRESSION: 1. Postsurgical changes from L3-S1 posterior spinal fusion and posterior decompression. 2. Unchanged moderate spinal canal stenosis at L2-L3. 3. Moderate to severe left neural foraminal narrowing at L5-S1, slightly progressed compared to prior exam.   Electronically Signed By: Lorenza Cambridge M.D. On: 07/24/2022 13:26   Narrative CLINICAL DATA:  Lumbar radiculopathy.  Back surgery December 2017  EXAM: MRI LUMBAR SPINE WITHOUT AND WITH CONTRAST  TECHNIQUE: Multiplanar and multiecho pulse sequences of the lumbar spine were obtained without and with intravenous contrast.  CONTRAST:  20mL MULTIHANCE GADOBENATE DIMEGLUMINE 529 MG/ML IV SOLN  COMPARISON:  Lumbar MRI 10/28/2015  FINDINGS: Segmentation:  Normal  Alignment: Mild retrolisthesis L2-3, L3-4. 7 mm retrolisthesis L4-5 . Progression of kyphosis in the upper lumbar spine.  Vertebrae:  Negative for fracture or mass.  Conus medullaris: Extends to the T12-L1 level and appears normal.  Paraspinal and other soft tissues: Interval  development of fluid collection in the posterior soft tissues in the midline extending into the subcutaneous tissues. There is a tail of fluid extending toward the lamina at L4-5. The fluid collection measures 17 x 45 x 62 mm and has peripheral enhancement.  Disc levels:  L1-2:  Negative  L2-3: Mild retrolisthesis. Disc bulging and facet degeneration. Mild spinal stenosis.  L3-4: Mild retrolisthesis. Diffuse disc bulging and facet degeneration. Mild spinal stenosis. Subarticular stenosis bilaterally  L4-5: 7 mm retrolisthesis has progressed in the interval. Interval decompression. There remains moderate spinal stenosis. Bilateral facet hypertrophy. Marked subarticular and foraminal encroachment bilaterally with compression of the L4 and L5 nerve roots left greater than right. Posterior fluid collection at this level may represent a CSF leak.  L5-S1:  Mild disc and facet degeneration without stenosis.  IMPRESSION: Mild spinal stenosis L2-3 and L3-4  Interval decompression at L4-5. There remains moderate spinal stenosis and marked subarticular and foraminal stenosis bilaterally left greater than right. Posterior fluid collection this level may communicate with the thecal sac and could represent a CSF leak. Other possibilities would include a seroma and infection.   Electronically Signed By: Marlan Palau M.D. On: 12/13/2016 16:42    CT LUMBAR SPINE WO CONTRAST  Narrative CLINICAL DATA:  Low back pain with symptoms persisting over  6 weeks of treatment.  EXAM: CT LUMBAR SPINE WITHOUT CONTRAST  TECHNIQUE: Multidetector CT imaging of the lumbar spine was performed without intravenous contrast administration. Multiplanar CT image reconstructions were also generated.  RADIATION DOSE REDUCTION: This exam was performed according to the departmental dose-optimization program which includes automated exposure control, adjustment of the mA and/or kV according to patient  size and/or use of iterative reconstruction technique.  COMPARISON:  Lumbar MRI from earlier the same day  FINDINGS: Segmentation: 5 lumbar type vertebrae  Alignment: Mild L3-4 retrolisthesis.  Vertebrae: No acute fracture or aggressive bone lesion. Generalized osteopenia.  L3-L5 PLIF. No solid arthrodesis is seen at the L3-4 level where there is cage subsidence and loosening of the L3 pedicle screw at least on the right, tip reaching the degenerated L2-3 disc space. Solid arthrodesis is seen at L4-5. There is posterior-lateral fusion hardware at L5-S1 with prominent bilateral L5 pedicle screw loosening and no bridging bone.  Paraspinal and other soft tissues: No perispinal mass or inflammation noted.  Disc levels: Canal and foraminal patency better seen on contemporaneous MRI, bony foraminal stenosis is most notable at L5-S1.  IMPRESSION: 1. L3-S1 fusion with pseudoarthrosis findings at L3-4 and L5-S1, as described. 2. L2-3 advanced anterior disc degeneration, the right-sided L3 pedicle screw extends to the disc space. 3. Canal and foraminal patency better assessed on contemporaneous MRI.   Electronically Signed By: Tiburcio Pea M.D. On: 07/25/2022 07:53    CT Lumbar Spine W Contrast  Narrative CLINICAL DATA:  Low back pain.  Bilateral leg pain.  EXAM: LUMBAR MYELOGRAM  FLUOROSCOPY TIME:  1 min and 3 seconds.  PROCEDURE: After thorough discussion of risks and benefits of the procedure including bleeding, infection, injury to nerves, blood vessels, adjacent structures as well as headache and CSF leak, written and oral informed consent was obtained. Consent was obtained by Dr. Davonna Belling. Time out form was completed.  Patient was positioned prone on the fluoroscopy table. Local anesthesia was provided with 1% lidocaine without epinephrine after prepped and draped in the usual sterile fashion. Puncture was performed at L3 using a 3 1/2 inch 22-gauge  spinal needle via left paramedian approach. Using a single pass through the dura, the needle was placed within the thecal sac, with return of clear CSF. 15 mL of Omnipaque-180 was injected into the thecal sac, with normal opacification of the nerve roots and cauda equina consistent with free flow within the subarachnoid space.  I personally performed the lumbar puncture and administered the intrathecal contrast. I also personally supervised acquisition of the myelogram images.  TECHNIQUE: Contiguous axial images were obtained through the Lumbar spine after the intrathecal infusion of infusion. Coronal and sagittal reconstructions were obtained of the axial image sets.  COMPARISON:  None  FINDINGS: LUMBAR MYELOGRAM FINDINGS:  Good opacification lumbar subarachnoid space. Mild stenosis L4-5 related to slight anterolisthesis and posterior element hypertrophy. Left greater than right L5 nerve root impingement noted with patient standing.  Upright lateral views demonstrate 3 mm anterolisthesis in neutral and flexion, decrease and 1 mm in extension. Shallow ventral defects at L4-5, L3-4, and and L2-3 appear noncompressive. No worrisome osseous lesions.  CT LUMBAR MYELOGRAM FINDINGS:  Normal conus. No aneurysmal dilatation. Minor atherosclerotic change with calcification. No paravertebral masses.  The individual disc spaces were examined with axial images as follows:  L1-L2:  Normal.  L2-L3:  Mild bulge.  No impingement.  L3-L4:  Mild bulge.  No impingement.  L4-L5: Mild disc space narrowing greater on the  left with Schmorl's node formation and sclerosis. Mild anterolisthesis. Mild bulge. Mild facet arthropathy. Left greater than right L5 nerve root effacement.  L5-S1:  Mild bulge.  Mild facet arthropathy.  No impingement.  IMPRESSION: LUMBAR MYELOGRAM IMPRESSION:  Slight dynamic instability L4-5 with mild stenosis and left greater than right L5 nerve root  effacement.  CT LUMBAR MYELOGRAM IMPRESSION:  Mild multifactorial stenosis L4-5 related to mild bulge and slip. Posterior element hypertrophy. Correlate clinically for left greater than right L5 nerve root compression.   Electronically Signed By: Davonna Belling M.D. On: 11/22/2013 13:03   Narrative CLINICAL DATA:  Chronic bilateral LOWER back pain.  Fusion.  EXAM: LUMBAR SPINE - 2-3 VIEW  COMPARISON:  04/13/2023  FINDINGS: Remote posterior fusion of L2-S2. The hardware is intact and unchanged. Stable appearance of anterior compression at L2 and L3. Stable grade 1 retrolisthesis of L3 on L4.  There is no acute fracture.  No lytic or blastic lesions.  IMPRESSION: 1. Remote posterior fusion of L2-S2. No evidence for acute abnormality. 2. Stable appearance of anterior compression at L2 and L3. 3. Stable grade 1 retrolisthesis of L3 on L4.   Electronically Signed By: Norva Pavlov M.D. On: 08/29/2023 18:02   Narrative CLINICAL DATA:  Low back pain.  Bilateral leg pain.  EXAM: LUMBAR MYELOGRAM  FLUOROSCOPY TIME:  1 min and 3 seconds.  PROCEDURE: After thorough discussion of risks and benefits of the procedure including bleeding, infection, injury to nerves, blood vessels, adjacent structures as well as headache and CSF leak, written and oral informed consent was obtained. Consent was obtained by Dr. Davonna Belling. Time out form was completed.  Patient was positioned prone on the fluoroscopy table. Local anesthesia was provided with 1% lidocaine without epinephrine after prepped and draped in the usual sterile fashion. Puncture was performed at L3 using a 3 1/2 inch 22-gauge spinal needle via left paramedian approach. Using a single pass through the dura, the needle was placed within the thecal sac, with return of clear CSF. 15 mL of Omnipaque-180 was injected into the thecal sac, with normal opacification of the nerve roots and cauda equina consistent  with free flow within the subarachnoid space.  I personally performed the lumbar puncture and administered the intrathecal contrast. I also personally supervised acquisition of the myelogram images.  TECHNIQUE: Contiguous axial images were obtained through the Lumbar spine after the intrathecal infusion of infusion. Coronal and sagittal reconstructions were obtained of the axial image sets.  COMPARISON:  None  FINDINGS: LUMBAR MYELOGRAM FINDINGS:  Good opacification lumbar subarachnoid space. Mild stenosis L4-5 related to slight anterolisthesis and posterior element hypertrophy. Left greater than right L5 nerve root impingement noted with patient standing.  Upright lateral views demonstrate 3 mm anterolisthesis in neutral and flexion, decrease and 1 mm in extension. Shallow ventral defects at L4-5, L3-4, and and L2-3 appear noncompressive. No worrisome osseous lesions.  CT LUMBAR MYELOGRAM FINDINGS:  Normal conus. No aneurysmal dilatation. Minor atherosclerotic change with calcification. No paravertebral masses.  The individual disc spaces were examined with axial images as follows:  L1-L2:  Normal.  L2-L3:  Mild bulge.  No impingement.  L3-L4:  Mild bulge.  No impingement.  L4-L5: Mild disc space narrowing greater on the left with Schmorl's node formation and sclerosis. Mild anterolisthesis. Mild bulge. Mild facet arthropathy. Left greater than right L5 nerve root effacement.  L5-S1:  Mild bulge.  Mild facet arthropathy.  No impingement.  IMPRESSION: LUMBAR MYELOGRAM IMPRESSION:  Slight dynamic instability L4-5  with mild stenosis and left greater than right L5 nerve root effacement.  CT LUMBAR MYELOGRAM IMPRESSION:  Mild multifactorial stenosis L4-5 related to mild bulge and slip. Posterior element hypertrophy. Correlate clinically for left greater than right L5 nerve root compression.   Electronically Signed By: Davonna Belling M.D. On: 11/22/2013  13:03   Complexity Note: Imaging results reviewed.                         ROS  Cardiovascular: High blood pressure Pulmonary or Respiratory: Smoking, Snoring , and Temporary stoppage of breathing during sleep Neurological: No reported neurological signs or symptoms such as seizures, abnormal skin sensations, urinary and/or fecal incontinence, being born with an abnormal open spine and/or a tethered spinal cord Psychological-Psychiatric: Depressed Gastrointestinal: No reported gastrointestinal signs or symptoms such as vomiting or evacuating blood, reflux, heartburn, alternating episodes of diarrhea and constipation, inflamed or scarred liver, or pancreas or irrregular and/or infrequent bowel movements Genitourinary: No reported renal or genitourinary signs or symptoms such as difficulty voiding or producing urine, peeing blood, non-functioning kidney, kidney stones, difficulty emptying the bladder, difficulty controlling the flow of urine, or chronic kidney disease Hematological: No reported hematological signs or symptoms such as prolonged bleeding, low or poor functioning platelets, bruising or bleeding easily, hereditary bleeding problems, low energy levels due to low hemoglobin or being anemic Endocrine: High blood sugar controlled without the use of insulin (NIDDM) and thyropid cancer Rheumatologic: Joint aches and or swelling due to excess weight (Osteoarthritis) Musculoskeletal: Negative for myasthenia gravis, muscular dystrophy, multiple sclerosis or malignant hyperthermia Work History: Working full time  Allergies  Sherri Dickerson is allergic to liraglutide, macrobid [nitrofurantoin], morphine and codeine, and wellbutrin [bupropion].  Laboratory Chemistry Profile   Renal Lab Results  Component Value Date   BUN 12 10/12/2022   CREATININE 0.88 10/12/2022   GFRAA >60 04/26/2017   GFRNONAA >60 10/12/2022   PROTEINUR >=300 (A) 11/07/2022     Electrolytes Lab Results  Component Value  Date   NA 139 10/12/2022   K 3.5 10/12/2022   CL 102 10/12/2022   CALCIUM 9.3 10/12/2022     Hepatic Lab Results  Component Value Date   AST 29 08/28/2013   ALT 45 08/28/2013   ALBUMIN 4.0 08/28/2013   ALKPHOS 71 08/28/2013     ID Lab Results  Component Value Date   SARSCOV2NAA Not Detected 06/10/2021   STAPHAUREUS POSITIVE (A) 10/12/2022   MRSAPCR NEGATIVE 10/12/2022     Bone No results found for: "VD25OH", "VD125OH2TOT", "[id number]", "QM5784ON6", "25OHVITD1", "25OHVITD2", "25OHVITD3", "TESTOFREE", "TESTOSTERONE"   Endocrine Lab Results  Component Value Date   GLUCOSE 111 (H) 10/12/2022   GLUCOSEU NEGATIVE 11/07/2022   HGBA1C 6.4 (H) 10/12/2022     Neuropathy Lab Results  Component Value Date   HGBA1C 6.4 (H) 10/12/2022     CNS No results found for: "COLORCSF", "APPEARCSF", "RBCCOUNTCSF", "WBCCSF", "POLYSCSF", "LYMPHSCSF", "EOSCSF", "PROTEINCSF", "GLUCCSF", "JCVIRUS", "CSFOLI", "IGGCSF", "LABACHR", "ACETBL"   Inflammation (CRP: Acute  ESR: Chronic) No results found for: "CRP", "ESRSEDRATE", "LATICACIDVEN"   Rheumatology No results found for: "RF", "ANA", "LABURIC", "URICUR", "LYMEIGGIGMAB", "LYMEABIGMQN", "HLAB27"   Coagulation Lab Results  Component Value Date   INR 0.96 04/18/2017   LABPROT 12.7 04/18/2017   APTT 26 04/18/2017   PLT 139 (L) 10/25/2022     Cardiovascular Lab Results  Component Value Date   HGB 8.5 (L) 10/25/2022   HCT 25.5 (L) 10/25/2022     Screening Lab Results  Component Value Date   SARSCOV2NAA Not Detected 06/10/2021   STAPHAUREUS POSITIVE (A) 10/12/2022   MRSAPCR NEGATIVE 10/12/2022     Cancer No results found for: "CEA", "CA125", "LABCA2"   Allergens No results found for: "ALMOND", "APPLE", "ASPARAGUS", "AVOCADO", "BANANA", "BARLEY", "BASIL", "BAYLEAF", "GREENBEAN", "LIMABEAN", "WHITEBEAN", "BEEFIGE", "REDBEET", "BLUEBERRY", "BROCCOLI", "CABBAGE", "MELON", "CARROT", "CASEIN", "CASHEWNUT", "CAULIFLOWER", "CELERY"      Note: Lab results reviewed.  PFSH  Drug: Sherri Dickerson  reports no history of drug use. Alcohol:  reports that she does not currently use alcohol. Tobacco:  reports that she quit smoking about 15 months ago. Her smoking use included cigarettes. She has never used smokeless tobacco. Medical:  has a past medical history of Anxiety, Arthritis, Carpal tunnel syndrome, bilateral, Diabetes mellitus without complication (HCC), Diabetic neuropathy (HCC), Family history of adverse reaction to anesthesia, Gastritis, GERD (gastroesophageal reflux disease), History of 2019 novel coronavirus disease (COVID-19) (08/23/2022), Hyperlipidemia, Hypertension, Hypothyroidism, Migraines, Personal history of radiation therapy, Sleep apnea, Status post radiation therapy, Thyroid cancer (HCC) (2012?), and Type 2 diabetes mellitus with peripheral neuropathy (HCC). Family: family history includes Dementia in her father; Diabetes in her mother.  Past Surgical History:  Procedure Laterality Date   ABDOMINAL HYSTERECTOMY  2005   ANTERIOR LUMBAR FUSION N/A 10/24/2022   Procedure: L2-3 LATERAL LUMBAR INTERBODY FUSION (NUVASIVE), L2-3 POSTERIOR COLUMN OSTEOTOMY;  Surgeon: Venetia Night, MD;  Location: [facility] ORS;  Service: Neurosurgery;  Laterality: N/A;   APPLICATION OF INTRAOPERATIVE CT SCAN N/A 10/24/2022   Procedure: APPLICATION OF INTRAOPERATIVE CT SCAN;  Surgeon: Venetia Night, MD;  Location: [facility];  Service: Neurosurgery;  Laterality: N/A;   COLONOSCOPY  2013?   Dr Ricki Rodriguez   COLONOSCOPY N/A 03/21/2022   Procedure: COLONOSCOPY;  Surgeon: Jaynie Collins, DO;  Location: [facility] ENDOSCOPY;  Service: Gastroenterology;  Laterality: N/A;   ESOPHAGOGASTRODUODENOSCOPY N/A 03/21/2022   Procedure: ESOPHAGOGASTRODUODENOSCOPY (EGD);  Surgeon: Jaynie Collins, DO;  Location: [facility];  Service: Gastroenterology;  Laterality: N/A;   FORAMINOTOMY 1 LEVEL N/A 10/24/2022   Procedure: LEFT L5-S1 FORAMINOTOMY;   Surgeon: Venetia Night, MD;  Location: [facility];  Service: Neurosurgery;  Laterality: N/A;   FORAMINOTOMY 2 LEVEL  07/25/2016   Procedure: FORAMINOTOMY 2 LEVEL;  Surgeon: Lucy Chris, MD;  Location: [facility];  Service: Neurosurgery;;  L4-5, L3-4   LUMBAR LAMINECTOMY/DECOMPRESSION MICRODISCECTOMY Left 07/25/2016   Procedure: LUMBAR LAMINECTOMY/DECOMPRESSION MICRODISCECTOMY 2 LEVELS;  Surgeon: Lucy Chris, MD;  Location: [facility];  Service: Neurosurgery;  Laterality: Left;  L3-4 hemilaminectomy and  L4-5 full laminectomy   NASAL SINUS SURGERY     x 2   OOPHORECTOMY     POSTERIOR LUMBAR FUSION 4 LEVEL N/A 10/24/2022   Procedure: L2-S2 POSTERIOR SPINAL FUSION;  Surgeon: Venetia Night, MD;  Location: [facility];  Service: Neurosurgery;  Laterality: N/A;   THYROIDECTOMY  2012?   Dr Elenore Rota   TONSILLECTOMY     TRANSFORAMINAL LUMBAR INTERBODY FUSION (TLIF) WITH PEDICLE SCREW FIXATION 3 LEVEL Bilateral 04/24/2017   Procedure: TRANSFORAMINAL LUMBAR INTERBODY FUSION (TLIF) WITH PEDICLE SCREW FIXATION 3 LEVEL-L3-S1;  Surgeon: Lucy Chris, MD;  Location: [facility];  Service: Neurosurgery;  Laterality: Bilateral;   tubes in ears     when she was 63 years old   Active Ambulatory Problems    Diagnosis Date Noted   Rectal pain 09/18/2015   Gluteal pain 09/18/2015   Spinal stenosis 04/24/2017   S/P spinal fusion 10/24/2022   Pseudarthrosis after fusion or arthrodesis 10/24/2022   Chronic bilateral low back pain  with left-sided sciatica 10/24/2022   Mechanical complication of internal orthopedic implant (HCC) 10/24/2022   Sagittal plane imbalance 10/24/2022   Lumbar post-laminectomy syndrome 10/26/2023   Chronic radicular lumbar pain 10/26/2023   Chronic pain syndrome 10/26/2023   Resolved Ambulatory Problems    Diagnosis Date Noted   No Resolved Ambulatory Problems   Past Medical History:  Diagnosis Date   Anxiety    Arthritis    Carpal tunnel syndrome, bilateral    Diabetes mellitus  without complication (HCC)    Diabetic neuropathy (HCC)    Family history of adverse reaction to anesthesia    Gastritis    GERD (gastroesophageal reflux disease)    History of 2019 novel coronavirus disease (COVID-19) 08/23/2022   Hyperlipidemia    Hypertension    Hypothyroidism    Migraines    Personal history of radiation therapy    Sleep apnea    Status post radiation therapy    Thyroid cancer (HCC) 2012?   Type 2 diabetes mellitus with peripheral neuropathy (HCC)    Constitutional Exam  General appearance: Well nourished, well developed, and well hydrated. In no apparent acute distress Vitals:   10/26/23 1101  BP: (!) 165/97  Pulse: 86  Temp: (!) 97.5 F (36.4 C)  SpO2: 100%  Weight: 230 lb (104.3 kg)  Height: 5\' 6"  (1.676 m)   BMI Assessment: Estimated body mass index is 37.12 kg/m as calculated from the following:   Height as of this encounter: 5\' 6"  (1.676 m).   Weight as of this encounter: 230 lb (104.3 kg).  BMI interpretation table: BMI level Category Range association with higher incidence of chronic pain  <18 kg/m2 Underweight   18.5-24.9 kg/m2 Ideal body weight   25-29.9 kg/m2 Overweight Increased incidence by 20%  30-34.9 kg/m2 Obese (Class I) Increased incidence by 68%  35-39.9 kg/m2 Severe obesity (Class II) Increased incidence by 136%  >40 kg/m2 Extreme obesity (Class III) Increased incidence by 254%   Patient's current BMI Ideal Body weight  Body mass index is 37.12 kg/m. Ideal body weight: 59.3 kg (130 lb 11.7 oz) Adjusted ideal body weight: 77.3 kg (170 lb 7 oz)   BMI Readings from Last 4 Encounters:  10/26/23 37.12 kg/m  08/24/23 36.32 kg/m  04/13/23 36.32 kg/m  01/12/23 36.32 kg/m   Wt Readings from Last 4 Encounters:  10/26/23 230 lb (104.3 kg)  08/24/23 225 lb (102.1 kg)  04/13/23 225 lb (102.1 kg)  01/12/23 225 lb (102.1 kg)    Psych/Mental status: Alert, oriented x 3 (person, place, & time)       Eyes: PERLA Respiratory: No  evidence of acute respiratory distress  Thoracic Spine Area Exam  Skin & Axial Inspection: No masses, redness, or swelling Alignment: Symmetrical Functional ROM: Unrestricted ROM Stability: No instability detected Muscle Tone/Strength: Functionally intact. No obvious neuro-muscular anomalies detected. Sensory (Neurological): Unimpaired Muscle strength & Tone: No palpable anomalies  Lumbar Spine Area Exam  Skin & Axial Inspection: Well healed scar from previous spine surgery detected Alignment: Symmetrical Functional ROM: Pain restricted ROM affecting both sides LEFT>RIGHT Stability: No instability detected Muscle Tone/Strength: Functionally intact. No obvious neuro-muscular anomalies detected. Sensory (Neurological): Dermatomal pain pattern  Gait & Posture Assessment  Ambulation: Unassisted Gait: Relatively normal for age and body habitus Posture: WNL  Lower Extremity Exam    Side: Right lower extremity  Side: Left lower extremity  Stability: No instability observed          Stability: No instability observed  Skin & Extremity Inspection: Skin color, temperature, and hair growth are WNL. No peripheral edema or cyanosis. No masses, redness, swelling, asymmetry, or associated skin lesions. No contractures.  Skin & Extremity Inspection: Skin color, temperature, and hair growth are WNL. No peripheral edema or cyanosis. No masses, redness, swelling, asymmetry, or associated skin lesions. No contractures.  Functional ROM: Unrestricted ROM                  Functional ROM: Pain restricted ROM for hip and knee joints          Muscle Tone/Strength: Functionally intact. No obvious neuro-muscular anomalies detected.  Muscle Tone/Strength: Functionally intact. No obvious neuro-muscular anomalies detected.  Sensory (Neurological): Unimpaired        Sensory (Neurological): Dermatomal pain pattern        DTR: Patellar: deferred today Achilles: deferred today Plantar: deferred today   DTR: Patellar: deferred today Achilles: deferred today Plantar: deferred today  Palpation: No palpable anomalies  Palpation: No palpable anomalies    Assessment  Primary Diagnosis & Pertinent Problem List: The primary encounter diagnosis was Failed back surgical syndrome. Diagnoses of Lumbar post-laminectomy syndrome, Chronic radicular lumbar pain, and Chronic pain syndrome were also pertinent to this visit.  Visit Diagnosis (New problems to examiner): 1. Failed back surgical syndrome   2. Lumbar post-laminectomy syndrome   3. Chronic radicular lumbar pain   4. Chronic pain syndrome    Plan of Care (Initial workup plan)   I explained to pt how a spinal cord stimulation (SCS) is indicated for chronic, intractable pain, especially in cases of failed back surgery syndrome, radiculopathy, or pain syndromes that affect broader areas such as the back and legs.  SCS devices provide continuous electrical impulses to the spinal cord, modulating pain signals before they reach the brain.  We discussed the potential benefits and risks of spinal cord stimulation. Benefits: can provide substantial relief from chronic pain, long-term therapy with programmable settings, often reduces the need for oral medications, including opioids, some patients experience reduced dependency on other pain interventions. Risks (including but not limited to): Surgical risks, including infection, lead migration/fracture, and hardware malfunction, long-term implantation requires ongoing maintenance and possible reoperation, variable effectiveness: some patients do not experience sufficient pain relief.  I discussed  percutaneous spinal cord stimulator trial with the patient in detail. I explained to the patient that they will have an external power source and programmer which the patient will use for 7 days. There will be daily communication with the stimulator company and the patient. A possible need for a mid-trial clinic visit  to give the patient the best chance of success.   Patient has completed psychosocial behavioral evaluation.  Some of patient's pain does seem to be mechanical in nature, with some component of neurogenic pain as well. We discussed the indications for spinal cord stimulation, specifically stating that it is typically better for neuropathic and appendicular pain, but that we have had some success in the treatment of low back and hip pain.   Patient is interested in proceeding with spinal cord stimulation trial. She understands that this may not be successful, and that spinal cord stimulation in general is not a "magic bullet."   I was able to evaluate her interlaminar windows under live fluoroscopy and they appear patent for percutaneous access.  We had a lengthy and very detailed discussion of all the risks, benefits, alternatives, and rationale of surgery as well as the option of continuing nonsurgical therapies. We specifically discussed  the risks of temporary or permanent worsened neurologic injury, no symptomatic relief or pain made worse after procedure, and also the need for future surgery (due to infection, CSF leak, bleeding, adjacent segment issues, bone-healing difficulties, and other related issues). No guarantees of outcome were made or implied and she is eager to proceed and presents for definitive treatment.  She told me that all of his questions were answered thoroughly and to her satisfaction. Confidence and understanding of the discussed risks and consequences of  treatment was expressed and she accepted these risks and was eager to proceed with procedure.   Issues concerning treatment and diagnosis were discussed with the patient. There are no barriers to understanding the plan of treatment. Explanation was well received by patient and/or family who then verbalized understanding.    Imaging Orders         DG PAIN CLINIC C-ARM 1-60 MIN NO REPORT     Procedure Orders         [place]  TRIAL     Orders Placed This Encounter  Procedures   [place] TRIAL    Contact medical implant company representative to notify them of the scheduled case and to make sure they will be available to provide required equipment.    Standing Status:   Future    Expected Date:   11/26/2023    Expiration Date:   01/26/2024    Scheduling Instructions:     Side: Bilateral     Level: Lumbar     Device: [employer]     Sedation: With sedation     Timeframe: As soon as pre-approved    Where will this procedure be performed?:   [facility]   DG PAIN CLINIC C-ARM 1-60 MIN NO REPORT    Intraoperative interpretation by procedural physician at [facility].    Standing Status:   Standing    Number of Occurrences:   1    Reason for exam::   Assistance in needle guidance and placement for procedures requiring needle placement in or near specific anatomical locations not easily accessible without such assistance.     Provider-requested follow-up: Return in about 1 month (around 11/29/2023) for [facility] Trial, ECT.  No future appointments.  Duration of encounter: .  Total time on encounter, as per AMA guidelines included both the face-to-face and non-face-to-face time personally spent by the physician and/or other qualified health care professional(s) on the day of the encounter (includes time in activities that require the physician or other qualified health care professional and does not include time in activities normally performed by clinical staff). Physician's time may include the following activities when performed: Preparing to see the patient (e.g., pre-charting review of records, searching for previously ordered imaging, lab work, and nerve conduction tests) Review of prior analgesic pharmacotherapies. Reviewing PMP Interpreting ordered tests (e.g., lab work, imaging, nerve conduction tests) Performing post-procedure evaluations, including interpretation of  diagnostic procedures Obtaining and/or reviewing separately obtained history Performing a medically appropriate examination and/or evaluation Counseling and educating the patient/family/caregiver Ordering medications, tests, or procedures Referring and communicating with other health care professionals (when not separately reported) Documenting clinical information in the electronic or other health record Independently interpreting results (not separately reported) and communicating results to the patient/ family/caregiver Care coordination (not separately reported)  Note by: Edward Jolly, MD (AI and TTS technology used. I apologize for any typographical errors that were not detected and corrected.) Date: 10/26/2023; Time: 1:58 PM

## 2023-10-26 NOTE — Patient Instructions (Addendum)
 Soap scrub [employer] scs trial   ______________________________________________________________________    Procedure instructions  Stop blood-thinners  Do not eat or drink fluids (other than water) for 6 hours before your procedure  No water for 2 hours before your procedure  Take your blood pressure medicine with a sip of water  Arrive 30 minutes before your appointment  If sedation is planned, bring suitable driver. Pennie Banter, Benedetto Goad, & public transportation are NOT APPROVED)  Carefully read the "Preparing for your procedure" detailed instructions  If you have questions call us at [phone]  Procedure appointments are for procedures only. NO medication refills or new problem evaluations.   ______________________________________________________________________

## 2023-10-26 NOTE — Progress Notes (Signed)
 Safety precautions to be maintained throughout the outpatient stay will include: orient to surroundings, keep bed in low position, maintain call bell within reach at all times, provide assistance with transfer out of bed and ambulation.

## 2023-12-04 ENCOUNTER — Ambulatory Visit
Admission: RE | Admit: 2023-12-04 | Discharge: 2023-12-04 | Disposition: A | Discharge status: Home / Self Care | Source: Ambulatory Visit | Attending: Student in an Organized Health Care Education/Training Program | Admitting: Student in an Organized Health Care Education/Training Program

## 2023-12-04 ENCOUNTER — Ambulatory Visit
Attending: Student in an Organized Health Care Education/Training Program | Admitting: Student in an Organized Health Care Education/Training Program

## 2023-12-04 ENCOUNTER — Encounter: Payer: Self-pay | Admitting: Student in an Organized Health Care Education/Training Program

## 2023-12-04 DIAGNOSIS — M961 Postlaminectomy syndrome, not elsewhere classified: Secondary | ICD-10-CM | POA: Insufficient documentation

## 2023-12-04 DIAGNOSIS — M5416 Radiculopathy, lumbar region: Secondary | ICD-10-CM | POA: Diagnosis not present

## 2023-12-04 DIAGNOSIS — G8929 Other chronic pain: Secondary | ICD-10-CM | POA: Diagnosis present

## 2023-12-04 DIAGNOSIS — G894 Chronic pain syndrome: Secondary | ICD-10-CM | POA: Diagnosis not present

## 2023-12-04 MED ORDER — LIDOCAINE HCL 2 % IJ SOLN
INTRAMUSCULAR | Status: AC
  Filled 2023-12-04: qty 20

## 2023-12-04 MED ORDER — CEFAZOLIN SODIUM 1 G IJ SOLR
INTRAMUSCULAR | Status: AC
  Filled 2023-12-04: qty 10

## 2023-12-04 MED ORDER — CEPHALEXIN 500 MG PO CAPS
500.0000 mg | ORAL_CAPSULE | Freq: Four times a day (QID) | ORAL | 0 refills | Status: AC
Start: 2023-12-04 — End: 2023-12-11

## 2023-12-04 MED ORDER — LACTATED RINGERS IV SOLN: Freq: Once | INTRAVENOUS | Status: AC

## 2023-12-04 MED ORDER — LIDOCAINE HCL 2 % IJ SOLN
20.0000 mL | Freq: Once | INTRAMUSCULAR | Status: AC
  Administered 2023-12-04: 400 mg

## 2023-12-04 MED ORDER — FENTANYL CITRATE (PF) 100 MCG/2ML IJ SOLN
INTRAMUSCULAR | Status: AC
Start: 2023-12-04 — End: ?
  Filled 2023-12-04: qty 2

## 2023-12-04 MED ORDER — ROPIVACAINE HCL 2 MG/ML IJ SOLN
9.0000 mL | Freq: Once | INTRAMUSCULAR | Status: AC
  Administered 2023-12-04: 20 mL via PERINEURAL

## 2023-12-04 MED ORDER — CEFAZOLIN SODIUM-DEXTROSE 2-4 GM/100ML-% IV SOLN
2.0000 g | Freq: Once | INTRAVENOUS | Status: AC
  Administered 2023-12-04: 2 g via INTRAVENOUS

## 2023-12-04 MED ORDER — MIDAZOLAM HCL 5 MG/5ML IJ SOLN
INTRAMUSCULAR | Status: AC
  Filled 2023-12-04: qty 5

## 2023-12-04 NOTE — Progress Notes (Signed)
 1004 Versed  2 mg IV, Fentanyl  75 mcg IV DW 1018 Fentanyl  25 mcg IV  DW

## 2023-12-04 NOTE — Progress Notes (Signed)
 PROVIDER NOTE: Interpretation of information contained herein should be left to medically-trained personnel. Specific patient instructions are provided elsewhere under "Patient Instructions" section of medical record. This document was created in part using STT-dictation technology, any transcriptional errors that may result from this process are unintentional.  Patient: Sherri Dickerson Type: Established DOB: 1960/08/23 MRN: [id number] PCP: Lenell Query, PA-C  Service: Procedure DOS: 12/04/2023 Setting: Ambulatory Location: Ambulatory outpatient facility Delivery: Face-to-face Provider: Cephus Collin, MD Specialty: Interventional Pain Management Specialty designation: 09 Location: Outpatient facility Ref. Prov.: Cephus Collin, MD       Interventional Therapy   Primary Reason for Admission: Surgical management of chronic pain condition.   Procedure:              Type: [employer] Trial Spinal Cord Neurostimulator Implant (Percutaneous, interlaminar, posterior epidural placement) Laterality: Bilateral (-50)  Level: Lumbar  Imaging: Fluoroscopic guidance Anesthesia: Local anesthesia (1-2% Lidocaine ) Sedation: Moderate Sedation                       DOS: 12/04/2023  Performed by: Cephus Collin, MD  Purpose: Diagnostic. To determine if a permanent implant may be effective in controlling some or all of Sherri Dickerson's chronic pain symptoms.  Rationale (medical necessity): procedure needed and proper for the diagnosis and/or treatment of Sherri Dickerson's medical symptoms and needs. 1. Failed back surgical syndrome   2. Lumbar post-laminectomy syndrome   3. Chronic radicular lumbar pain   4. Chronic pain syndrome    NAS-11 Pain score:   Pre-procedure: 2 /10   Post-procedure: 1 /10     Target: Posterior epidural space over the dorsal columns of the spinal cord. Location: Posterior intraspinal canal Region: Thoracolumbar  Approach: Translaminar percutaneous  Type of procedure:  Surgical   Position / Prep / Materials:  Position: Prone  Prep solution: ChloraPrep (2% chlorhexidine  gluconate and 70% isopropyl alcohol) Prep Area: Entire  Posterior  Thoracolumbar  Region  Materials:  Tray: Implant tray Needle(s):  Type: Epidural  Gauge (G):  14   Length: Regular (10cm)  Qty: 2  H&P (Pre-op Assessment):  Sherri Dickerson is a 63 y.o. (year old), female patient, seen today for interventional treatment. She  has a past surgical history that includes Thyroidectomy (2012?); Colonoscopy (2013?); Abdominal hysterectomy (2005); Lumbar laminectomy/decompression microdiscectomy (Left, 07/25/2016); Foraminotomy 2 level (07/25/2016); Tonsillectomy; Transforaminal lumbar interbody fusion (tlif) with pedicle screw fixation 3 level (Bilateral, 04/24/2017); Oophorectomy; Colonoscopy (N/A, 03/21/2022); Esophagogastroduodenoscopy (N/A, 03/21/2022); tubes in ears; Nasal sinus surgery; Anterior lumbar fusion (N/A, 10/24/2022); Posterior lumbar fusion 4 level (N/A, 10/24/2022); Foraminotomy 1 level (N/A, 10/24/2022); and Application of intraoperative CT scan (N/A, 10/24/2022).  Initial Vital Signs:  Pulse/EKG Rate: 89ECG Heart Rate: 81 Temp: (!) 97.3 F (36.3 C) Resp: 16 BP: (!) 176/80 SpO2: 99 %  BMI: Estimated body mass index is 37.12 kg/m as calculated from the following:   Height as of this encounter: 5\' 6"  (1.676 m).   Weight as of this encounter: 230 lb (104.3 kg).  Risk Assessment: Allergies: Reviewed. She is allergic to liraglutide, macrobid [nitrofurantoin], morphine and codeine, and wellbutrin [bupropion].  Allergy Precautions: None required Coagulopathies: Reviewed. None identified.  Blood-thinner therapy: None at this time Active Infection(s): Reviewed. None identified. Sherri Dickerson is afebrile  Site Confirmation: Sherri Dickerson was asked to confirm the procedure and laterality before marking the site, which she did. Procedure checklist: Completed Consent: Before the procedure and  under the influence of no sedative(s), amnesic(s), or anxiolytics, the patient  was informed of the treatment options, risks and possible complications. To fulfill our ethical and legal obligations, as recommended by the American Medical Association's Code of Ethics, I have informed the patient of my clinical impression; the nature and purpose of the treatment or procedure; the risks, benefits, and possible complications of the intervention; the alternatives, including doing nothing; the risk(s) and benefit(s) of the alternative treatment(s) or procedure(s); and the risk(s) and benefit(s) of doing nothing.  Sherri Dickerson was provided with information about the general risks and possible complications associated with most interventional procedures. These include, but are not limited to: failure to achieve desired goals, infection, bleeding, organ or nerve damage, allergic reactions, paralysis, and/or death.  In addition, she was informed of those risks and possible complications associated to this particular procedure, which include, but are not limited to: damage to the implant; failure to decrease pain; local, systemic, or serious CNS infections, intraspinal abscess with possible cord compression and paralysis, or life-threatening such as meningitis; intrathecal and/or epidural bleeding with formation of hematoma with possible spinal cord compression and permanent paralysis; organ damage; nerve injury or damage with subsequent sensory, motor, and/or autonomic system dysfunction, resulting in transient or permanent pain, numbness, and/or weakness of one or several areas of the body; allergic reactions, either minor or major life-threatening, such as anaphylactic or anaphylactoid reactions.  Furthermore, Sherri Dickerson was informed of those risks and complications associated with the medications. These include, but are not limited to: allergic reactions (i.e.: anaphylactic or anaphylactoid reactions); arrhythmia;   Hypotension/hypertension; cardiovascular collapse; respiratory depression and/or shortness of breath; swelling or edema; medication-induced neural toxicity; particulate matter embolism and blood vessel occlusion with resultant organ, and/or nervous system infarction and permanent paralysis.  Finally, she was informed that Medicine is not an exact science; therefore, there is also the possibility of unforeseen or unpredictable risks and/or possible complications that may result in a catastrophic outcome. The patient indicated having understood very clearly. We have given the patient no guarantees and we have made no promises. Enough time was given to the patient to ask questions, all of which were answered to the patient's satisfaction. Ms. Anstey has indicated that she wanted to continue with the procedure. Attestation: I, the ordering provider, attest that I have discussed with the patient the benefits, risks, side-effects, alternatives, likelihood of achieving goals, and potential problems during recovery for the procedure that I have provided informed consent. Date  Time: 12/04/2023  8:56 AM  Pre-Procedure Preparation:  Monitoring: As per clinic protocol. Respiration, ETCO2, SpO2, BP, heart rate and rhythm monitor placed and checked for adequate function Safety Precautions: Patient was assessed for positional comfort and pressure points before starting the procedure. Time-out: I initiated and conducted the "Time-out" before starting the procedure, as per protocol. The patient was asked to participate by confirming the accuracy of the "Time Out" information. Verification of the correct person, site, and procedure were performed and confirmed by me, the nursing staff, and the patient. "Time-out" conducted as per Joint Commission's Universal Protocol (UP.01.01.01). Time: 1013 Start Time: 1014 hrs.  Description/Narrative of Procedure:          Rationale (medical necessity): procedure needed and proper  for the diagnosis and/or treatment of the patient's medical symptoms and needs. Procedural Technique Safety Precautions: Aspiration looking for blood return was conducted prior to all injections. At no point did we inject any substances, as a needle was being advanced. No attempts were made at seeking any paresthesias. Safe injection practices and needle disposal  techniques used. Medications properly checked for expiration dates. SDV (single dose vial) medications used. Description of the Procedure: Protocol guidelines were followed. The patient was assisted into a comfortable position. The target area was identified and the area prepped in the usual manner. Skin & deeper tissues infiltrated with local anesthetic. Appropriate amount of time allowed to pass for local anesthetics to take effect. The procedure needles were then advanced to the target area. Proper needle placement secured. Negative aspiration confirmed. Solution injected in intermittent fashion, asking for systemic symptoms every 0.5cc of injectate. The needles were then removed and the area cleansed, making sure to leave some of the prepping solution back to take advantage of its long term bactericidal properties.  Technical description of procedure: Availability of a responsible, adult driver, and NPO status confirmed. Informed consent was obtained after having discussed risks and possible complications. An IV was started. The patient was then taken to the fluoroscopy suite, where the patient was placed in position for the procedure, over the fluoroscopy table. The patient was then monitored in the usual manner. Fluoroscopy was manipulated to obtain the best possible view of the target. Parallex error was corrected before commencing the procedure. Once a clear view of the target had been obtained, the skin and deeper tissues over the procedure site were infiltrated using lidocaine , loaded in a 10 cc luer-loc syringe with a 0.5 inch, 25-G needle.  The introducer needle(s) was/were then inserted through the skin and deeper tissues. A paramidline approach was used to enter the posterior epidural space at a 30 angle, using "Loss-of-resistance Technique" with 3 ml of PF-NaCl (0.9% NSS). Correct needle placement was confirmed in the antero-posterior and lateral fluoroscopic views. The lead was gently introduced and manipulated under real-time fluoroscopy, constantly assessing for pain, discomfort, or paresthesias, until the tip rested at the desired level. Both sides were done in identical fashion. Electrode placement was tested until appropriate coverage was attained. Once the patient confirmed that the stimulation was over the desired area, the lead(s) was/were secured in place and the introducer needles removed. This was done under real-time fluoroscopy while observing the electrode tip to avoid unintended migration. The area was covered with a non-occlusive dressing and the patient transported to recovery for further programming.  Vitals:   12/04/23 1039 12/04/23 1045 12/04/23 1055 12/04/23 1105  BP: (!) 139/101 (!) 169/80 (!) 151/81 (!) 142/86  Pulse:      Resp: 12 14 18 20   Temp:  (!) 97.5 F (36.4 C)  (!) 97.5 F (36.4 C)  TempSrc:      SpO2: 94% 96% 97% 98%  Weight:      Height:        Start Time: 1014 hrs. End Time: 1042 hrs.  Neurostimulator Details:  Lead(s):  Brand: [employer]         Epidural Access Level:  T12-L1 T12-L1  Lead implant:  Bilateral   No. of Electrodes/Lead:  16 16  Laterality:  Left Right  Top electrode location:  T7 T7  Bottom electrode location:  T9 T9  Model No.: [id number] E Same  Length: 50 cm Same  Lot No.: [id number]   Imaging Guidance (Spinal):          Type of Imaging Technique: Fluoroscopy Guidance (Spinal) Indication(s): Fluoroscopy guidance for needle placement to enhance accuracy in procedures requiring precise needle localization for targeted delivery of medication in or near  specific anatomical locations not easily accessible without such real-time imaging assistance. Exposure Time: Please see nurses  notes. Contrast: None used. Fluoroscopic Guidance: I was personally present during the use of fluoroscopy. "Tunnel Vision Technique" used to obtain the best possible view of the target area. Parallax error corrected before commencing the procedure. "Direction-depth-direction" technique used to introduce the needle under continuous pulsed fluoroscopy. Once target was reached, antero-posterior, oblique, and lateral fluoroscopic projection used confirm needle placement in all planes. Images permanently stored in EMR. Interpretation: No contrast injected. I personally interpreted the imaging intraoperatively. Adequate needle placement confirmed in multiple planes. Permanent images saved into the patient's record.     Antibiotic Prophylaxis:   Anti-infectives (From admission, onward)    Start     Dose/Rate Route Frequency Ordered Stop   12/04/23 1015  ceFAZolin  (ANCEF ) IVPB 2g/100 mL premix        2 g 200 mL/hr over 30 Minutes Intravenous  Once 12/04/23 1007 12/04/23 1038   12/04/23 0000  cephALEXin  (KEFLEX ) 500 MG capsule        500 mg Oral 4 times daily 12/04/23 0957 12/11/23 2359      Indication(s): Implant Prophylaxis.  Post-operative Assessment:  Post-procedure Vital Signs:  Pulse/HCG Rate: 8979 Temp: (!) 97.5 F (36.4 C) Resp: 20 BP: (!) 142/86 SpO2: 98 %  Complications: No immediate post-treatment complications observed by team, or reported by patient.  Note: The patient tolerated the entire procedure well. A repeat set of vitals were taken after the procedure and the patient was kept under observation following institutional policy, for this type of procedure. Post-procedural neurological assessment was performed, showing return to baseline, prior to discharge. The patient was provided with post-procedure discharge instructions, including a section on  how to identify potential problems. Should any problems arise concerning this procedure, the patient was given instructions to immediately contact us , at any time, without hesitation. In any case, we plan to contact the patient by telephone for a follow-up status report regarding this interventional procedure.  Comments:  No additional relevant information.  Plan of Care  Orders:  Orders Placed This Encounter  Procedures   DG PAIN CLINIC C-ARM 1-60 MIN NO REPORT    Intraoperative interpretation by procedural physician at [facility].    Standing Status:   Standing    Number of Occurrences:   1    Reason for exam::   Assistance in needle guidance and placement for procedures requiring needle placement in or near specific anatomical locations not easily accessible without such assistance.    Medications administered: We administered lidocaine , ceFAZolin , lactated ringers , and ropivacaine  (PF) 2 mg/mL (0.2%).  See the medical record for exact dosing, route, and time of administration.  Follow-up plan:   Return in about 1 week (around 12/11/2023) for SCS lead pull with Marthe Slain.      Recent Visits Date Type Provider Dept  10/26/23 Office Visit Cephus Collin, MD [facility]  Showing recent visits within past 90 days and meeting all other requirements Today's Visits Date Type Provider Dept  12/04/23 Procedure visit Cephus Collin, MD [facility]  Showing today's visits and meeting all other requirements Future Appointments Date Type Provider Dept  12/11/23 Appointment Patel, Seema K, NP [facility]  Showing future appointments within next 90 days and meeting all other requirements  Disposition: Discharge home  Discharge (Date  Time): 12/04/2023; 1124 hrs.   Primary Care Physician: Lenell Query, PA-C Location: [facility] Note by: Cephus Collin, MD (TTS technology used. I apologize for any typographical  errors that were not  detected and corrected.) Date: 12/04/2023; Time: 12:30 PM

## 2023-12-04 NOTE — Progress Notes (Signed)
 Safety precautions to be maintained throughout the outpatient stay will include: orient to surroundings, keep bed in low position, maintain call bell within reach at all times, provide assistance with transfer out of bed and ambulation.

## 2023-12-05 ENCOUNTER — Telehealth: Payer: Self-pay | Admitting: *Deleted

## 2023-12-05 NOTE — Telephone Encounter (Signed)
 No problems post procedure. Reports small amount of additional bleeding at site last night, but none now.  I told her she may come to the office and Dr. Rhesa Celeste can assess it and change the dressing if necessary.

## 2023-12-11 ENCOUNTER — Encounter: Payer: Self-pay | Admitting: Nurse Practitioner

## 2023-12-11 ENCOUNTER — Ambulatory Visit: Attending: Nurse Practitioner | Admitting: Nurse Practitioner

## 2023-12-11 VITALS — BP 175/87 | HR 95 | Temp 97.2°F | Ht 66.0 in | Wt 230.0 lb

## 2023-12-11 DIAGNOSIS — G8929 Other chronic pain: Secondary | ICD-10-CM

## 2023-12-11 DIAGNOSIS — M961 Postlaminectomy syndrome, not elsewhere classified: Secondary | ICD-10-CM

## 2023-12-11 DIAGNOSIS — G894 Chronic pain syndrome: Secondary | ICD-10-CM

## 2023-12-11 DIAGNOSIS — M5416 Radiculopathy, lumbar region: Secondary | ICD-10-CM

## 2023-12-11 NOTE — Progress Notes (Unsigned)
 PROVIDER NOTE: Interpretation of information contained herein should be left to medically-trained personnel. Specific patient instructions are provided elsewhere under "Patient Instructions" section of medical record. This document was created in part using AI and STT-dictation technology, any transcriptional errors that may result from this process are unintentional.  Patient: Sherri Dickerson  Service: E/M   PCP: Lenell Query, PA-C  DOB: 28-Sep-1960  DOS: 12/11/2023  Provider: Cherylin Corrigan, NP  MRN: [id number]  Delivery: Face-to-face  Specialty: Interventional Pain Management  Type: Established Patient  Setting: Ambulatory outpatient facility  Specialty designation: 09  Referring Prov.: Lenell Query,*  Location: Outpatient office facility       HPI  [name], a 63 y.o. year old female, is here today because of her Failed back surgical syndrome [M96.1]. [name]'s primary complain today is Back Pain and Leg Pain   Pain Assessment: Severity of Chronic pain is reported as a 0-No pain/10. Location: Back Lower, Right, Left/No longer radaites down legs since placement of SCS leads during week trial. Onset: More than a month ago. Quality: Dull, Aching. Timing: Intermittent. Modifying factor(s): SCS, resting, and sitting. Vitals:  height is 5\' 6"  (1.676 m) and weight is 230 lb (104.3 kg). Her temporal temperature is 97.2 F (36.2 C) (abnormal). Her blood pressure is 175/87 (abnormal) and her pulse is 95. Her oxygen saturation is 99%.  BMI: Estimated body mass index is 37.12 kg/m as calculated from the following:   Height as of this encounter: 5\' 6"  (1.676 m).   Weight as of this encounter: 230 lb (104.3 kg). Last encounter: 12/05/2023.  Reason for encounter: post-procedure evaluation and assessment.  [name], completed a 7 days SCS trial.  Lead placement: T2-L1, T12-L1 bilateral percutaneous, intralaminar, posterior epidural placement. Following SCS trial,  the  patient achieved 80% pain reduction and  significance gains in functional status.  No significant side effects were reported by patient.   The spinal cord stimulator (SCS lead removal was performed successfully without any difficulty.  The leads appeared intact and were removed easily without any complications.  The patient tolerated the procedure well, without notable pain or discomfort.  Examination of the lead insertion site revealed no sign of redness, swelling, warmth or purulent drainage.  The leads were intact and showed no sign of breakage or displacement.   Pharmacotherapy Assessment  Analgesic:   Monitoring: [facility] PMP: PDMP not reviewed this encounter.       Pharmacotherapy: No side-effects or adverse reactions reported. Compliance: No problems identified. Effectiveness: Clinically acceptable.  Huston Maiers, RN  12/11/2023  8:20 AM  Sign when Signing Visit Safety precautions to be maintained throughout the outpatient stay will include: orient to surroundings, keep bed in low position, maintain call bell within reach at all times, provide assistance with transfer out of bed and ambulation.   No results found for: "CBDTHCR" No results found for: "D8THCCBX" No results found for: "D9THCCBX"  UDS:  No results found for: "SUMMARY"    ROS  Constitutional: Denies any fever or chills Gastrointestinal: No reported hemesis, hematochezia, vomiting, or acute GI distress Musculoskeletal: Denies any acute onset joint swelling, redness, loss of ROM, or weakness Neurological: No reported episodes of acute onset apraxia, aphasia, dysarthria, agnosia, amnesia, paralysis, loss of coordination, or loss of consciousness  Medication Review  Calcium , Cyanocobalamin, Fifty50 Glucose Meter 2.0, Magnesium  Carbonate (Antacid), Melatonin, Multi-Vitamins, RABEprazole, Vitamin C, Vitamin D (Ergocalciferol), Zinc Picolinate, celecoxib, cephALEXin , citalopram , fenofibrate , gabapentin , levothyroxine ,  lisinopril -hydrochlorothiazide , metFORMIN , methocarbamol ,  milk thistle, nortriptyline , penicillin v potassium, rosuvastatin , traZODone , and venlafaxine  XR  History Review  Allergy: [name] is allergic to liraglutide, macrobid [nitrofurantoin], morphine and codeine, and wellbutrin [bupropion]. Drug: [name]  reports no history of drug use. Alcohol:  reports that she does not currently use alcohol. Tobacco:  reports that she quit smoking about 16 months ago. Her smoking use included cigarettes. She has never used smokeless tobacco. Social: [name]  reports that she quit smoking about 16 months ago. Her smoking use included cigarettes. She has never used smokeless tobacco. She reports that she does not currently use alcohol. She reports that she does not use drugs. Medical:  has a past medical history of Anxiety, Arthritis, Carpal tunnel syndrome, bilateral, Diabetes mellitus without complication (HCC), Diabetic neuropathy (HCC), Family history of adverse reaction to anesthesia, Gastritis, GERD (gastroesophageal reflux disease), History of 2019 novel coronavirus disease (COVID-19) (08/23/2022), Hyperlipidemia, Hypertension, Hypothyroidism, Migraines, Personal history of radiation therapy, Sleep apnea, Status post radiation therapy, Thyroid  cancer (HCC) (2012?), and Type 2 diabetes mellitus with peripheral neuropathy (HCC). Surgical: [name]  has a past surgical history that includes Thyroidectomy (2012?); Colonoscopy (2013?); Abdominal hysterectomy (2005); Lumbar laminectomy/decompression microdiscectomy (Left, 07/25/2016); Foraminotomy 2 level (07/25/2016); Tonsillectomy; Transforaminal lumbar interbody fusion (tlif) with pedicle screw fixation 3 level (Bilateral, 04/24/2017); Oophorectomy; Colonoscopy (N/A, 03/21/2022); Esophagogastroduodenoscopy (N/A, 03/21/2022); tubes in ears; Nasal sinus surgery; Anterior lumbar fusion (N/A, 10/24/2022); Posterior lumbar fusion 4 level (N/A, 10/24/2022);  Foraminotomy 1 level (N/A, 10/24/2022); and Application of intraoperative CT scan (N/A, 10/24/2022). Family: family history includes Dementia in her father; Diabetes in her mother.  Laboratory Chemistry Profile   Renal Lab Results  Component Value Date   BUN 12 10/12/2022   CREATININE 0.88 10/12/2022   GFRAA >60 04/26/2017   GFRNONAA >60 10/12/2022    Hepatic Lab Results  Component Value Date   AST 29 08/28/2013   ALT 45 08/28/2013   ALBUMIN  4.0 08/28/2013   ALKPHOS 71 08/28/2013    Electrolytes Lab Results  Component Value Date   NA 139 10/12/2022   K 3.5 10/12/2022   CL 102 10/12/2022   CALCIUM  9.3 10/12/2022    Bone No results found for: "VD25OH", "VD125OH2TOT", "NF6213YQ6", "VH8469GE9", "25OHVITD1", "25OHVITD2", "25OHVITD3", "TESTOFREE", "TESTOSTERONE"  Inflammation (CRP: Acute Phase) (ESR: Chronic Phase) No results found for: "CRP", "ESRSEDRATE", "LATICACIDVEN"       Note: Above Lab results reviewed.  Recent Imaging Review   MR THORACIC SPINE WO CONTRAST   Narrative CLINICAL DATA:  Evaluation for stenosis prior to spinal cord stimulator   EXAM: MRI THORACIC SPINE WITHOUT CONTRAST   TECHNIQUE: Multiplanar, multisequence MR imaging of the thoracic spine was performed. No intravenous contrast was administered.   COMPARISON:  None Available.   FINDINGS: Alignment:  No significant listhesis.   Vertebrae: Vertebral body heights are maintained. No marrow edema. No suspicious lesion.   Cord:  No abnormal signal.   Paraspinal and other soft tissues: Unremarkable.   Disc levels:   Scattered small disc bulges and protrusions. For example, central protrusions at T4-T5, T5-T6, and T6-T7; right paracentral protrusion at T7-T8; left paracentral protrusions at T8-T9 and T9-T10. minor canal stenosis. Multilevel mild facet arthropathy. No high-grade foraminal stenosis.   IMPRESSION: Multilevel degenerative changes without high-grade stenosis.      Electronically Signed By: Geannie Keener M.D. On: 10/24/2023 09:49     MR LUMBAR SPINE WO CONTRAST   Narrative CLINICAL DATA:  Lumbar radiculopathy   EXAM: MRI LUMBAR SPINE WITHOUT CONTRAST  TECHNIQUE: Multiplanar, multisequence MR imaging of the lumbar spine was performed. No intravenous contrast was administered.   COMPARISON:  MRI L Spine 05/27/20   FINDINGS: Segmentation:  Standard.   Alignment: Mild retrolisthesis of L3 on L4 and L4 on L5, unchanged compared to prior exam.   Vertebrae: Postsurgical changes from L3-S1 posterior spinal fusion. No evidence of fracture. No discitis. No worrisome lesions.   Conus medullaris and cauda equina: Conus extends to the L1 level. Conus and cauda equina appear normal.   Paraspinal and other soft tissues: Negative.   Disc levels:   T12-L1: Minimal disc bulge. No spinal canal stenosis. Mild bilateral facet degenerative change. No neural foraminal stenosis.   L1-L2: Minimal disc bulge. Mild bilateral facet degenerative change. No spinal canal stenosis. No neural foraminal stenosis.   L2-L3: Circumferential disc bulge. Short pedicles. Mild bilateral facet degenerative change. Ligamentum flavum hypertrophy. Moderate spinal canal narrowing. Mild left neural foraminal stenosis.   L3-L4: Fused level. Redemonstrated mild retrolisthesis. Posterior decompression. No significant spinal canal narrowing. Mild left neural foraminal narrowing.   L4-L5: Fused level. Status post posterior decompression. No significant disc bulge. No spinal canal stenosis. Mild bilateral neural foraminal narrowing.   L5-S1: Fused level. Status post posterior decompression. Ligamentum flavum hypertrophy. Circumferential disc bulge. Epidural lipomatosis. No bony spinal canal narrowing. There is moderate to severe narrowing of the thecal sac at this level. Severe left and moderate right neural foraminal narrowing, progressed compared to prior exam.    IMPRESSION: 1. Postsurgical changes from L3-S1 posterior spinal fusion and posterior decompression. 2. Unchanged moderate spinal canal stenosis at L2-L3. 3. Moderate to severe left neural foraminal narrowing at L5-S1, slightly progressed compared to prior exam.     Electronically Signed By: Clora Dane M.D. On: 07/24/2022 13:26     Narrative CLINICAL DATA:  Lumbar radiculopathy.  Back surgery December 2017   EXAM: MRI LUMBAR SPINE WITHOUT AND WITH CONTRAST   TECHNIQUE: Multiplanar and multiecho pulse sequences of the lumbar spine were obtained without and with intravenous contrast.   CONTRAST:  20mL MULTIHANCE  GADOBENATE DIMEGLUMINE  529 MG/ML IV SOLN   COMPARISON:  Lumbar MRI 10/28/2015   FINDINGS: Segmentation:  Normal   Alignment: Mild retrolisthesis L2-3, L3-4. 7 mm retrolisthesis L4-5 . Progression of kyphosis in the upper lumbar spine.   Vertebrae:  Negative for fracture or mass.   Conus medullaris: Extends to the T12-L1 level and appears normal.   Paraspinal and other soft tissues: Interval development of fluid collection in the posterior soft tissues in the midline extending into the subcutaneous tissues. There is a tail of fluid extending toward the lamina at L4-5. The fluid collection measures 17 x 45 x 62 mm and has peripheral enhancement.   Disc levels:   L1-2:  Negative   L2-3: Mild retrolisthesis. Disc bulging and facet degeneration. Mild spinal stenosis.   L3-4: Mild retrolisthesis. Diffuse disc bulging and facet degeneration. Mild spinal stenosis. Subarticular stenosis bilaterally   L4-5: 7 mm retrolisthesis has progressed in the interval. Interval decompression. There remains moderate spinal stenosis. Bilateral facet hypertrophy. Marked subarticular and foraminal encroachment bilaterally with compression of the L4 and L5 nerve roots left greater than right. Posterior fluid collection at this level may represent a CSF leak.   L5-S1:   Mild disc and facet degeneration without stenosis.   IMPRESSION: Mild spinal stenosis L2-3 and L3-4   Interval decompression at L4-5. There remains moderate spinal stenosis and marked subarticular and foraminal stenosis bilaterally left greater than right. Posterior fluid  collection this level may communicate with the thecal sac and could represent a CSF leak. Other possibilities would include a seroma and infection.     Electronically Signed By: Anastasio Balsam M.D. On: 12/13/2016 16:42       CT LUMBAR SPINE WO CONTRAST   Narrative CLINICAL DATA:  Low back pain with symptoms persisting over 6 weeks of treatment.   EXAM: CT LUMBAR SPINE WITHOUT CONTRAST   TECHNIQUE: Multidetector CT imaging of the lumbar spine was performed without intravenous contrast administration. Multiplanar CT image reconstructions were also generated.   RADIATION DOSE REDUCTION: This exam was performed according to the departmental dose-optimization program which includes automated exposure control, adjustment of the mA and/or kV according to patient size and/or use of iterative reconstruction technique.   COMPARISON:  Lumbar MRI from earlier the same day   FINDINGS: Segmentation: 5 lumbar type vertebrae   Alignment: Mild L3-4 retrolisthesis.   Vertebrae: No acute fracture or aggressive bone lesion. Generalized osteopenia.   L3-L5 PLIF. No solid arthrodesis is seen at the L3-4 level where there is cage subsidence and loosening of the L3 pedicle screw at least on the right, tip reaching the degenerated L2-3 disc space. Solid arthrodesis is seen at L4-5. There is posterior-lateral fusion hardware at L5-S1 with prominent bilateral L5 pedicle screw loosening and no bridging bone.   Paraspinal and other soft tissues: No perispinal mass or inflammation noted.   Disc levels: Canal and foraminal patency better seen on contemporaneous MRI, bony foraminal stenosis is most notable at L5-S1.    IMPRESSION: 1. L3-S1 fusion with pseudoarthrosis findings at L3-4 and L5-S1, as described. 2. L2-3 advanced anterior disc degeneration, the right-sided L3 pedicle screw extends to the disc space. 3. Canal and foraminal patency better assessed on contemporaneous MRI.     Electronically Signed By: Ronnette Coke M.D. On: 07/25/2022 07:53       CT Lumbar Spine W Contrast   Narrative CLINICAL DATA:  Low back pain.  Bilateral leg pain.   EXAM: LUMBAR MYELOGRAM   FLUOROSCOPY TIME:  1 min and 3 seconds.   PROCEDURE: After thorough discussion of risks and benefits of the procedure including bleeding, infection, injury to nerves, blood vessels, adjacent structures as well as headache and CSF leak, written and oral informed consent was obtained. Consent was obtained by Dr. Kathyleen Parkins. Time out form was completed.   Patient was positioned prone on the fluoroscopy table. Local anesthesia was provided with 1% lidocaine  without epinephrine  after prepped and draped in the usual sterile fashion. Puncture was performed at L3 using a 3 1/2 inch 22-gauge spinal needle via left paramedian approach. Using a single pass through the dura, the needle was placed within the thecal sac, with return of clear CSF. 15 mL of Omnipaque -180 was injected into the thecal sac, with normal opacification of the nerve roots and cauda equina consistent with free flow within the subarachnoid space.   I personally performed the lumbar puncture and administered the intrathecal contrast. I also personally supervised acquisition of the myelogram images.   TECHNIQUE: Contiguous axial images were obtained through the Lumbar spine after the intrathecal infusion of infusion. Coronal and sagittal reconstructions were obtained of the axial image sets.   COMPARISON:  None   FINDINGS: LUMBAR MYELOGRAM FINDINGS:   Good opacification lumbar subarachnoid space. Mild stenosis L4-5 related to slight  anterolisthesis and posterior element hypertrophy. Left greater than right L5 nerve root impingement noted with patient standing.   Upright lateral views demonstrate  3 mm anterolisthesis in neutral and flexion, decrease and 1 mm in extension. Shallow ventral defects at L4-5, L3-4, and and L2-3 appear noncompressive. No worrisome osseous lesions.   CT LUMBAR MYELOGRAM FINDINGS:   Normal conus. No aneurysmal dilatation. Minor atherosclerotic change with calcification. No paravertebral masses.   The individual disc spaces were examined with axial images as follows:   L1-L2:  Normal.   L2-L3:  Mild bulge.  No impingement.   L3-L4:  Mild bulge.  No impingement.   L4-L5: Mild disc space narrowing greater on the left with Schmorl's node formation and sclerosis. Mild anterolisthesis. Mild bulge. Mild facet arthropathy. Left greater than right L5 nerve root effacement.   L5-S1:  Mild bulge.  Mild facet arthropathy.  No impingement.   IMPRESSION: LUMBAR MYELOGRAM IMPRESSION:   Slight dynamic instability L4-5 with mild stenosis and left greater than right L5 nerve root effacement.   CT LUMBAR MYELOGRAM IMPRESSION:   Mild multifactorial stenosis L4-5 related to mild bulge and slip. Posterior element hypertrophy. Correlate clinically for left greater than right L5 nerve root compression.     Electronically Signed By: Kathyleen Parkins M.D. On: 11/22/2013 13:03     Narrative CLINICAL DATA:  Chronic bilateral LOWER back pain.  Fusion.   EXAM: LUMBAR SPINE - 2-3 VIEW   COMPARISON:  04/13/2023   FINDINGS: Remote posterior fusion of L2-S2. The hardware is intact and unchanged. Stable appearance of anterior compression at L2 and L3. Stable grade 1 retrolisthesis of L3 on L4.   There is no acute fracture.  No lytic or blastic lesions.   IMPRESSION: 1. Remote posterior fusion of L2-S2. No evidence for acute abnormality. 2. Stable appearance of anterior compression at L2 and  L3. 3. Stable grade 1 retrolisthesis of L3 on L4.     Electronically Signed By: Anitra Barn M.D. On: 08/29/2023 18:02     Narrative CLINICAL DATA:  Low back pain.  Bilateral leg pain.   EXAM: LUMBAR MYELOGRAM   FLUOROSCOPY TIME:  1 min and 3 seconds.   PROCEDURE: After thorough discussion of risks and benefits of the procedure including bleeding, infection, injury to nerves, blood vessels, adjacent structures as well as headache and CSF leak, written and oral informed consent was obtained. Consent was obtained by Dr. Kathyleen Parkins. Time out form was completed.   Patient was positioned prone on the fluoroscopy table. Local anesthesia was provided with 1% lidocaine  without epinephrine  after prepped and draped in the usual sterile fashion. Puncture was performed at L3 using a 3 1/2 inch 22-gauge spinal needle via left paramedian approach. Using a single pass through the dura, the needle was placed within the thecal sac, with return of clear CSF. 15 mL of Omnipaque -180 was injected into the thecal sac, with normal opacification of the nerve roots and cauda equina consistent with free flow within the subarachnoid space.   I personally performed the lumbar puncture and administered the intrathecal contrast. I also personally supervised acquisition of the myelogram images.   TECHNIQUE: Contiguous axial images were obtained through the Lumbar spine after the intrathecal infusion of infusion. Coronal and sagittal reconstructions were obtained of the axial image sets.   COMPARISON:  None   FINDINGS: LUMBAR MYELOGRAM FINDINGS:   Good opacification lumbar subarachnoid space. Mild stenosis L4-5 related to slight anterolisthesis and posterior element hypertrophy. Left greater than right L5 nerve root impingement noted with patient standing.   Upright lateral views demonstrate 3 mm anterolisthesis in neutral and flexion, decrease and 1  mm in extension. Shallow ventral  defects at L4-5, L3-4, and and L2-3 appear noncompressive. No worrisome osseous lesions.   CT LUMBAR MYELOGRAM FINDINGS:   Normal conus. No aneurysmal dilatation. Minor atherosclerotic change with calcification. No paravertebral masses.   The individual disc spaces were examined with axial images as follows:   L1-L2:  Normal.   L2-L3:  Mild bulge.  No impingement.   L3-L4:  Mild bulge.  No impingement.   L4-L5: Mild disc space narrowing greater on the left with Schmorl's node formation and sclerosis. Mild anterolisthesis. Mild bulge. Mild facet arthropathy. Left greater than right L5 nerve root effacement.   L5-S1:  Mild bulge.  Mild facet arthropathy.  No impingement.   IMPRESSION: LUMBAR MYELOGRAM IMPRESSION:   Slight dynamic instability L4-5 with mild stenosis and left greater than right L5 nerve root effacement.   CT LUMBAR MYELOGRAM IMPRESSION:   Mild multifactorial stenosis L4-5 related to mild bulge and slip. Posterior element hypertrophy. Correlate clinically for left greater than right L5 nerve root compression.     Electronically Signed By: Kathyleen Parkins M.D. On: 11/22/2013 13:03     Complexity Note: Imaging results reviewed.        Physical Exam  General appearance: Well nourished, well developed, and well hydrated. In no apparent acute distress Mental status: Alert, oriented x 3 (person, place, & time)       Respiratory: No evidence of acute respiratory distress Eyes: PERLA Vitals: BP (!) 175/87 (Cuff Size: Normal)   Pulse 95   Temp (!) 97.2 F (36.2 C) (Temporal)   Ht 5\' 6"  (1.676 m)   Wt 230 lb (104.3 kg)   SpO2 99%   BMI 37.12 kg/m  BMI: Estimated body mass index is 37.12 kg/m as calculated from the following:   Height as of this encounter: 5\' 6"  (1.676 m).   Weight as of this encounter: 230 lb (104.3 kg). Ideal: Ideal body weight: 59.3 kg (130 lb 11.7 oz) Adjusted ideal body weight: 77.3 kg (170 lb 7 oz)  Integumentary: No evidence of  redness, swelling, purulent drainage, warmth, irritation or skin breakdown Musculoskeletal: Improved range of motion, walking, and standing.   Assessment   Diagnosis Status  1. Failed back surgical syndrome   2. Lumbar post-laminectomy syndrome   3. Chronic radicular lumbar pain   4. Chronic pain syndrome    Improved Improved Improved   Plan of Care  Assessment and Plan The patient was instructed to shower and to closely monitor return to the insertion site for any unusual odor or, redness swelling or other concerning symptoms and to report promptly if any such symptoms occurs.   The spinal cord stimulator trial (SCS) was successful with no evidence of complication.  The patient has expressed a desire to move forward with the Spinal Cord Stimulator (SCS) implant to achieve better pain relief and improve functionality.  The patient was referred to Dr. Jeris Montes in the neurosurgery department for the permanent placement of SCS implant.   Pharmacotherapy (Medications Ordered): No orders of the defined types were placed in this encounter.  Orders:  Orders Placed This Encounter  Procedures   Ambulatory referral to Neurosurgery    Referral Priority:   Routine    Referral Type:   Surgical    Referral Reason:   Specialty Services Required    Referred to Provider:   Jodeen Munch, MD    Requested Specialty:   Neurosurgery    Number of Visits Requested:   1  Follow-up plan:   No follow-ups on file.    Recent Visits Date Type Provider Dept  12/04/23 Procedure visit Cephus Collin, MD [facility]  10/26/23 Office Visit Cephus Collin, MD [facility]  Showing recent visits within past 90 days and meeting all other requirements Today's Visits Date Type Provider Dept  12/11/23 Office Visit Jawaan Adachi K, NP [facility]  Showing today's visits and meeting all other requirements Future Appointments No visits were found meeting these  conditions. Showing future appointments within next 90 days and meeting all other requirements  I discussed the assessment and treatment plan with the patient. The patient was provided an opportunity to ask questions and all were answered. The patient agreed with the plan and demonstrated an understanding of the instructions.  Patient advised to call back or seek an in-person evaluation if the symptoms or condition worsens.  Duration of encounter: 45 minutes.  Total time on encounter, as per AMA guidelines included both the face-to-face and non-face-to-face time personally spent by the physician and/or other qualified health care professional(s) on the day of the encounter (includes time in activities that require the physician or other qualified health care professional and does not include time in activities normally performed by clinical staff). Physician's time may include the following activities when performed: Preparing to see the patient (e.g., pre-charting review of records, searching for previously ordered imaging, lab work, and nerve conduction tests) Review of prior analgesic pharmacotherapies. Reviewing PMP Interpreting ordered tests (e.g., lab work, imaging, nerve conduction tests) Performing post-procedure evaluations, including interpretation of diagnostic procedures Obtaining and/or reviewing separately obtained history Performing a medically appropriate examination and/or evaluation Counseling and educating the patient/family/caregiver Ordering medications, tests, or procedures Referring and communicating with other health care professionals (when not separately reported) Documenting clinical information in the electronic or other health record Independently interpreting results (not separately reported) and communicating results to the patient/ family/caregiver Care coordination (not separately reported)  Note by: Valene Villa K Ryver Zadrozny, NP (TTS and AI technology used. I apologize  for any typographical errors that were not detected and corrected.) Date: 12/11/2023; Time: 10:50 AM

## 2023-12-11 NOTE — Progress Notes (Unsigned)
 Safety precautions to be maintained throughout the outpatient stay will include: orient to surroundings, keep bed in low position, maintain call bell within reach at all times, provide assistance with transfer out of bed and ambulation.

## 2023-12-28 ENCOUNTER — Ambulatory Visit: Admitting: Neurosurgery

## 2024-01-04 ENCOUNTER — Ambulatory Visit (INDEPENDENT_AMBULATORY_CARE_PROVIDER_SITE_OTHER): Admitting: Neurosurgery

## 2024-01-04 ENCOUNTER — Encounter: Payer: Self-pay | Admitting: Neurosurgery

## 2024-01-04 ENCOUNTER — Other Ambulatory Visit: Payer: Self-pay

## 2024-01-04 VITALS — BP 130/82 | Ht 66.0 in | Wt 230.0 lb

## 2024-01-04 DIAGNOSIS — Z01818 Encounter for other preprocedural examination: Secondary | ICD-10-CM

## 2024-01-04 DIAGNOSIS — G894 Chronic pain syndrome: Secondary | ICD-10-CM

## 2024-01-04 DIAGNOSIS — M961 Postlaminectomy syndrome, not elsewhere classified: Secondary | ICD-10-CM | POA: Diagnosis not present

## 2024-01-04 NOTE — Progress Notes (Signed)
   REFERRING PHYSICIAN:  Patel, Seema K, [address] [place],  [place] [address]  DOS: 10/24/22 L2-3 XLIF, L2-S2 PSF. Left L5-S1 foraminotomy. L2-3 PCO  HISTORY OF PRESENT ILLNESS:  Sherri Dickerson is a 63 year old presenting today for follow-up after lumbar decompression and fusion.    She has had a successful spinal cord stimulator trial with a 70 to 80% improvement and symptoms during her evaluation.  This was with the [facility].    PHYSICAL EXAMINATION:  General: Patient is well developed, well nourished, calm, collected, and in no apparent distress.   NEUROLOGICAL:  General: In no acute distress.   Awake, alert, oriented to person, place, and time.  Pupils equal round and reactive to light.  Strength:            Side Iliopsoas Quads Hamstring PF DF EHL  R 5 5 5 5 5 5   L 4+ 5 5 5 5 5    Incisions well-healed  ROS (Neurologic):  Negative except as noted above  IMAGING: 01/11/22 lumbar xrays  Without evidence of hardware malfunction  April 13, 2023-stable  Aug 24, 2023 - stable  T spine MRI reviewed.  ASSESSMENT/PLAN:  Sherri Dickerson is doing fair after lumbar decompression and fusion.   She is a good candidate for spinal cord stimulator placement.  She had a successful trial.  She has passed her psychology evaluation.  Her thoracic MRI scan is stable and safe for placement.  I discussed the planned procedure at length with the patient, including the risks, benefits, alternatives, and indications. The risks discussed include but are not limited to bleeding, infection, need for reoperation, spinal fluid leak, stroke, vision loss, anesthetic complication, coma, paralysis, and even death. I also described in detail that improvement was not guaranteed.  The patient expressed understanding of these risks, and asked that we proceed with surgery. I described the surgery in layman's terms, and gave ample opportunity for questions, which were  answered to the best of my ability.  I spent a total of 10 minutes in both face-to-face and non-face-to-face activities for this visit on the date of this encounter and review of imaging, discussion of symptoms, physical exam, and documentation.   Jodeen Munch MD Department of neurosurgery

## 2024-01-04 NOTE — H&P (View-Only) (Signed)
   REFERRING PHYSICIAN:  Patel, Seema K, [address] [place],  [place] [address]  DOS: 10/24/22 L2-3 XLIF, L2-S2 PSF. Left L5-S1 foraminotomy. L2-3 PCO  HISTORY OF PRESENT ILLNESS:  Sherri Dickerson is a 63 year old presenting today for follow-up after lumbar decompression and fusion.    She has had a successful spinal cord stimulator trial with a 70 to 80% improvement and symptoms during her evaluation.  This was with the [facility].    PHYSICAL EXAMINATION:  General: Patient is well developed, well nourished, calm, collected, and in no apparent distress.   NEUROLOGICAL:  General: In no acute distress.   Awake, alert, oriented to person, place, and time.  Pupils equal round and reactive to light.  Strength:            Side Iliopsoas Quads Hamstring PF DF EHL  R 5 5 5 5 5 5   L 4+ 5 5 5 5 5    Incisions well-healed  ROS (Neurologic):  Negative except as noted above  IMAGING: 01/11/22 lumbar xrays  Without evidence of hardware malfunction  April 13, 2023-stable  Aug 24, 2023 - stable  T spine MRI reviewed.  ASSESSMENT/PLAN:  Sherri Dickerson is doing fair after lumbar decompression and fusion.   She is a good candidate for spinal cord stimulator placement.  She had a successful trial.  She has passed her psychology evaluation.  Her thoracic MRI scan is stable and safe for placement.  I discussed the planned procedure at length with the patient, including the risks, benefits, alternatives, and indications. The risks discussed include but are not limited to bleeding, infection, need for reoperation, spinal fluid leak, stroke, vision loss, anesthetic complication, coma, paralysis, and even death. I also described in detail that improvement was not guaranteed.  The patient expressed understanding of these risks, and asked that we proceed with surgery. I described the surgery in layman's terms, and gave ample opportunity for questions, which were  answered to the best of my ability.  I spent a total of 10 minutes in both face-to-face and non-face-to-face activities for this visit on the date of this encounter and review of imaging, discussion of symptoms, physical exam, and documentation.   Jodeen Munch MD Department of neurosurgery

## 2024-01-04 NOTE — Patient Instructions (Signed)
 Please see below for information in regards to your upcoming surgery:   Planned surgery: Thoracic Laminectomy of Spinal Cord Stimulator    Surgery date: 01/26/2024  at [facility] (Medical Mall: [address], [place], [place] [address]) - you will find out your arrival time the business day before your surgery.   Pre-op appointment at [facility] Pre-admit Testing: you will receive a call with a date/time for this appointment. If you are scheduled for an in person appointment, Pre-admit Testing is located on the first floor of the [facility] building, [address], [address]. During this appointment, they will advise you which medications you can take the morning of surgery, and which medications you will need to hold for surgery. Labs (such as blood work, EKG) may be done at your pre-op appointment. You are not required to fast for these labs. Should you need to change your pre-op appointment, please call Pre-admit testing at [phone].   Diabetes medications: Per anesthesia guidelines (due to the increased risk of aspiration caused by delayed gastric emptying):  Metformin : hold for 2 days prior to surgery  Surgical clearance: we will send a clearance form to [employer] . They may wish to see you in their office prior to signing the clearance form. If so, they may call you to schedule an appointment.   Common restrictions after surgery: No bending, lifting, or twisting ("BLT"). Avoid lifting objects heavier than 10 pounds for the first 6 weeks after surgery. Where possible, avoid household activities that involve lifting, bending, reaching, pushing, or pulling such as laundry, vacuuming, grocery shopping, and childcare. Try to arrange for help from friends and family for these activities while you heal. Do not drive while taking prescription pain medication. Weeks 6 through 12 after surgery: avoid lifting more than 25 pounds.    How to  contact us :  If you have any questions/concerns before or after surgery, you can reach us  at [phone], or you can send a mychart message. We can be reached by phone or mychart 8am-4pm, Monday-Friday.  *Please note: Calls after 4pm are forwarded to a third party answering service. Mychart messages are not routinely monitored during evenings, weekends, and holidays. Please call our office to contact the answering service for urgent concerns during non-business hours.   If you have FMLA/disability paperwork, please drop it off or fax it to [phone], attention Patty.   Appointments/FMLA & disability paperwork: Gerlean Kocher, & Maryann Smalls Registered Nurses/Surgery schedulers: Kendelyn & Merida Alcantar Medical Assistants: Donnajean Fuse Physician Assistants: Ludwig Safer, PA-C, Anastacio Karvonen, PA-C & Lucetta Russel, PA-C Surgeons: Jodeen Munch, MD & Henderson Lock, MD   [facility] PREADMIT TESTING VISIT and SURGERY INFORMATION SHEET   Now that surgery has been scheduled you can anticipate several phone calls from [facility] services. A pharmacy technician will call you to verify your current list of medications taken at home.               The Pre-Service Center will call to verify your insurance information and to give you billing estimates and information.             The Preadmit Testing Office will be calling to schedule a visit to obtain information for the anesthesia team and provide instructions on preparation for surgery.  What can you expect for the Preadmit Testing Visit: Appointments may be scheduled in-person or by telephone.  If a telephone visit is scheduled, you may be asked  to come into the office to have lab tests or other studies performed.   This visit will not be completed any greater than 14 days prior to your surgery.  If your surgery has been scheduled for a future date, please do not be alarmed if we have not contacted you to schedule an appointment more  than a month prior to the surgery date.    Please be prepared to provide the following information during this appointment:            -Personal medical history                                               -Medication and allergy list            -Any history of problems with anesthesia              -Recent lab work or diagnostic studies            -Please notify us  of any needs we should be aware of to provide the best care possible           -You will be provided with instructions on how to prepare for your surgery.    On The Day of Surgery:  You must have a driver to take you home after surgery, you will be asked not to drive for 24 hours following surgery.  Taxi, [employer] and non-medical transport will not be acceptable means of transportation unless you have a responsible individual who will be traveling with you.  Visitors in the surgical area:   2 people will be able to visit you in your room once your preparation for surgery has been completed. During surgery, your visitors will be asked to wait in the Surgery Waiting Area.  It is not a requirement for them to stay, if they prefer to leave and come back.  Your visitor(s) will be given an update once the surgery has been completed.  No visitors are allowed in the initial recovery room to respect patient privacy and safety.  Once you are more awake and transfer to the secondary recovery area, or are transferred to an inpatient room, visitors will again be able to see you.  To respect and protect your privacy: We will ask on the day of surgery who your driver will be and what the contact number for that individual will be. We will ask if it is okay to share information with this individual, or if there is an alternative individual that we, or the surgeon, should contact to provide updates and information. If family or friends come to the surgical information desk requesting information about you, who you have not listed with us , no  information will be given.   It may be helpful to designate someone as the main contact who will be responsible for updating your other friends and family.    PREADMIT TESTING OFFICE: [phone] SAME DAY SURGERY: [phone] We look forward to caring for you before and throughout the process of your surgery.

## 2024-01-16 ENCOUNTER — Other Ambulatory Visit: Payer: Self-pay

## 2024-01-16 ENCOUNTER — Encounter
Admission: RE | Admit: 2024-01-16 | Discharge: 2024-01-16 | Disposition: A | Discharge status: Home / Self Care | Source: Ambulatory Visit | Attending: Neurosurgery | Admitting: Neurosurgery

## 2024-01-16 DIAGNOSIS — Z01818 Encounter for other preprocedural examination: Secondary | ICD-10-CM | POA: Diagnosis present

## 2024-01-16 DIAGNOSIS — Z01812 Encounter for preprocedural laboratory examination: Secondary | ICD-10-CM

## 2024-01-16 DIAGNOSIS — Z01810 Encounter for preprocedural cardiovascular examination: Secondary | ICD-10-CM

## 2024-01-16 DIAGNOSIS — I10 Essential (primary) hypertension: Secondary | ICD-10-CM | POA: Insufficient documentation

## 2024-01-16 HISTORY — DX: Essential (primary) hypertension: I10

## 2024-01-16 HISTORY — DX: Unspecified osteoarthritis, unspecified site: M19.90

## 2024-01-16 HISTORY — DX: Other specified disorders of bone density and structure, unspecified site: M85.80

## 2024-01-16 HISTORY — DX: Fatty (change of) liver, not elsewhere classified: K76.0

## 2024-01-16 LAB — CBC
HCT: 36.7 % (ref 36.0–46.0)
Hemoglobin: 12.6 g/dL (ref 12.0–15.0)
MCH: 32.1 pg (ref 26.0–34.0)
MCHC: 34.3 g/dL (ref 30.0–36.0)
MCV: 93.4 fL (ref 80.0–100.0)
Platelets: 171 10*3/uL (ref 150–400)
RBC: 3.93 MIL/uL (ref 3.87–5.11)
RDW: 12.9 % (ref 11.5–15.5)
WBC: 6 10*3/uL (ref 4.0–10.5)
nRBC: 0 % (ref 0.0–0.2)

## 2024-01-16 LAB — TYPE AND SCREEN
ABO/RH(D): B POS
Antibody Screen: NEGATIVE

## 2024-01-16 LAB — SURGICAL PCR SCREEN
MRSA, PCR: NEGATIVE
Staphylococcus aureus: NEGATIVE

## 2024-01-16 NOTE — Patient Instructions (Addendum)
 Your procedure is scheduled on: Friday, June 13 Report to the Registration Desk on the 1st floor of the [facility] To find out your arrival time, please call [phone] between 1PM - 3PM on: Thursday, June 12 If your arrival time is 6:00 am, do not arrive before that time as the Medical Mall entrance doors do not open until 6:00 am.  REMEMBER: Instructions that are not followed completely may result in serious medical risk, up to and including death; or upon the discretion of your surgeon and anesthesiologist your surgery may need to be rescheduled.  Do not eat food after midnight the night before surgery.  No gum chewing or hard candies.  You may however, drink water  up to 2 hours before you are scheduled to arrive for your surgery. Do not drink anything within 2 hours of your scheduled arrival time.  One week prior to surgery: starting June 6 Stop ANY OVER THE COUNTER supplements until after surgery. Stop vitamin C, calcium , vitamin D, melatonin, milk thistle, multiple vitamins, zinc.  You may however, continue to take Tylenol  if needed for pain up until the day of surgery.  Metformin  - hold for 2 days before surgery. Last day to TAKE is Tuesday, June 10. Resume AFTER surgery.  Continue taking all of your other prescription medications up until the day of surgery.  ON THE DAY OF SURGERY ONLY TAKE THESE MEDICATIONS WITH SIPS OF WATER :  levothyroxine   nortriptyline   RABEprazole (ACIPHEX)  No Alcohol for 24 hours before or after surgery.  No Smoking including e-cigarettes for 24 hours before surgery.  No chewable tobacco products for at least 6 hours before surgery.  No nicotine patches on the day of surgery.  Do not use any "recreational" drugs for at least a week (preferably 2 weeks) before your surgery.  Please be advised that the combination of cocaine and anesthesia may have negative outcomes, up to and including death. If you test positive for cocaine, your surgery  will be cancelled.  On the morning of surgery brush your teeth with toothpaste and water , you may rinse your mouth with mouthwash if you wish. Do not swallow any toothpaste or mouthwash.  Use CHG Soap as directed on instruction sheet.  Do not wear jewelry, make-up, hairpins, clips or nail polish.  For welded (permanent) jewelry: bracelets, anklets, waist bands, etc.  Please have this removed prior to surgery.  If it is not removed, there is a chance that hospital personnel will need to cut it off on the day of surgery.  Do not wear lotions, powders, or perfumes.   Do not shave body hair from the neck down 48 hours before surgery.  Contact lenses, hearing aids and dentures may not be worn into surgery.  Do not bring valuables to the hospital. [facility] is not responsible for any missing/lost belongings or valuables.   Bring your C-PAP to the hospital in case you may have to spend the night.   Notify your doctor if there is any change in your medical condition (cold, fever, infection).  Wear comfortable clothing (specific to your surgery type) to the hospital.  After surgery, you can help prevent lung complications by doing breathing exercises.  Take deep breaths and cough every 1-2 hours.   If you are being discharged the day of surgery, you will not be allowed to drive home. You will need a responsible individual to drive you home and stay with you for 24 hours after surgery.   If you  are taking public transportation, you will need to have a responsible individual with you.  Please call the Pre-admissions Testing Dept. at [phone] if you have any questions about these instructions.  Surgery Visitation Policy:  Patients having surgery or a procedure may have two visitors.  Children under the age of 54 must have an adult with them who is not the patient.       Pre-operative 5 CHG Bath Instructions   You can play a key role in reducing the risk of infection after  surgery. Your skin needs to be as free of germs as possible. You can reduce the number of germs on your skin by washing with CHG (chlorhexidine  gluconate) soap before surgery. CHG is an antiseptic soap that kills germs and continues to kill germs even after washing.   DO NOT use if you have an allergy to chlorhexidine /CHG or antibacterial soaps. If your skin becomes reddened or irritated, stop using the CHG and notify one of our RNs at [phone].   Please shower with the CHG soap starting 4 days before surgery using the following schedule:     Please keep in mind the following:  DO NOT shave, including legs and underarms, starting the day of your first shower.   You may shave your face at any point before/day of surgery.  Place clean sheets on your bed the day you start using CHG soap. Use a clean washcloth (not used since being washed) for each shower. DO NOT sleep with pets once you start using the CHG.   CHG Shower Instructions:  If you choose to wash your hair and private area, wash first with your normal shampoo/soap.  After you use shampoo/soap, rinse your hair and body thoroughly to remove shampoo/soap residue.  Turn the water  OFF and apply about 3 tablespoons (45 ml) of CHG soap to a CLEAN washcloth.  Apply CHG soap ONLY FROM YOUR NECK DOWN TO YOUR TOES (washing for 3-5 minutes)  DO NOT use CHG soap on face, private areas, open wounds, or sores.  Pay special attention to the area where your surgery is being performed.  If you are having back surgery, having someone wash your back for you may be helpful. Wait 2 minutes after CHG soap is applied, then you may rinse off the CHG soap.  Pat dry with a clean towel  Put on clean clothes/pajamas   If you choose to wear lotion, please use ONLY the CHG-compatible lotions on the back of this paper.     Additional instructions for the day of surgery: DO NOT APPLY any lotions, deodorants, cologne, or perfumes.   Put on clean/comfortable  clothes.  Brush your teeth.  Ask your nurse before applying any prescription medications to the skin.      CHG Compatible Lotions   Aveeno Moisturizing lotion  Cetaphil Moisturizing Cream  Cetaphil Moisturizing Lotion  Clairol Herbal Essence Moisturizing Lotion, Dry Skin  Clairol Herbal Essence Moisturizing Lotion, Extra Dry Skin  Clairol Herbal Essence Moisturizing Lotion, Normal Skin  Curel Age Defying Therapeutic Moisturizing Lotion with Alpha Hydroxy  Curel Extreme Care Body Lotion  Curel Soothing Hands Moisturizing Hand Lotion  Curel Therapeutic Moisturizing Cream, Fragrance-Free  Curel Therapeutic Moisturizing Lotion, Fragrance-Free  Curel Therapeutic Moisturizing Lotion, Original Formula  Eucerin Daily Replenishing Lotion  Eucerin Dry Skin Therapy Plus Alpha Hydroxy Crme  Eucerin Dry Skin Therapy Plus Alpha Hydroxy Lotion  Eucerin Original Crme  Eucerin Original Lotion  Eucerin Plus Crme Eucerin Plus Lotion  Eucerin  TriLipid Replenishing Lotion  Keri Anti-Bacterial Hand Lotion  Keri Deep Conditioning Original Lotion Dry Skin Formula Softly Scented  Keri Deep Conditioning Original Lotion, Fragrance Free Sensitive Skin Formula  Keri Lotion Fast Absorbing Fragrance Free Sensitive Skin Formula  Keri Lotion Fast Absorbing Softly Scented Dry Skin Formula  Keri Original Lotion  Keri Skin Renewal Lotion Keri Silky Smooth Lotion  Keri Silky Smooth Sensitive Skin Lotion  Nivea Body Creamy Conditioning Oil  Nivea Body Extra Enriched Teacher, adult education Moisturizing Lotion Nivea Crme  Nivea Skin Firming Lotion  NutraDerm 30 Skin Lotion  NutraDerm Skin Lotion  NutraDerm Therapeutic Skin Cream  NutraDerm Therapeutic Skin Lotion  ProShield Protective Hand Cream  Provon moisturizing lotion

## 2024-01-26 ENCOUNTER — Other Ambulatory Visit: Payer: Self-pay

## 2024-01-26 ENCOUNTER — Ambulatory Visit: Payer: Self-pay | Admitting: Urgent Care

## 2024-01-26 ENCOUNTER — Ambulatory Visit: Admitting: Certified Registered"

## 2024-01-26 ENCOUNTER — Encounter
Admission: RE | Disposition: A | Discharge status: Home / Self Care | Payer: Self-pay | Source: Ambulatory Visit | Attending: Neurosurgery

## 2024-01-26 ENCOUNTER — Encounter: Payer: Self-pay | Admitting: Neurosurgery

## 2024-01-26 ENCOUNTER — Ambulatory Visit

## 2024-01-26 ENCOUNTER — Ambulatory Visit
Admission: RE | Admit: 2024-01-26 | Discharge: 2024-01-26 | Disposition: A | Discharge status: Home / Self Care | Source: Ambulatory Visit | Attending: Neurosurgery | Admitting: Neurosurgery

## 2024-01-26 DIAGNOSIS — G894 Chronic pain syndrome: Secondary | ICD-10-CM | POA: Diagnosis not present

## 2024-01-26 DIAGNOSIS — M961 Postlaminectomy syndrome, not elsewhere classified: Secondary | ICD-10-CM | POA: Diagnosis present

## 2024-01-26 DIAGNOSIS — K219 Gastro-esophageal reflux disease without esophagitis: Secondary | ICD-10-CM | POA: Insufficient documentation

## 2024-01-26 DIAGNOSIS — E119 Type 2 diabetes mellitus without complications: Secondary | ICD-10-CM | POA: Insufficient documentation

## 2024-01-26 DIAGNOSIS — G4730 Sleep apnea, unspecified: Secondary | ICD-10-CM | POA: Diagnosis not present

## 2024-01-26 DIAGNOSIS — I10 Essential (primary) hypertension: Secondary | ICD-10-CM | POA: Diagnosis not present

## 2024-01-26 DIAGNOSIS — Z01810 Encounter for preprocedural cardiovascular examination: Secondary | ICD-10-CM

## 2024-01-26 DIAGNOSIS — E039 Hypothyroidism, unspecified: Secondary | ICD-10-CM | POA: Insufficient documentation

## 2024-01-26 DIAGNOSIS — Z7984 Long term (current) use of oral hypoglycemic drugs: Secondary | ICD-10-CM | POA: Insufficient documentation

## 2024-01-26 DIAGNOSIS — Z01818 Encounter for other preprocedural examination: Secondary | ICD-10-CM

## 2024-01-26 DIAGNOSIS — Z01812 Encounter for preprocedural laboratory examination: Secondary | ICD-10-CM

## 2024-01-26 DIAGNOSIS — F419 Anxiety disorder, unspecified: Secondary | ICD-10-CM | POA: Insufficient documentation

## 2024-01-26 DIAGNOSIS — Z981 Arthrodesis status: Secondary | ICD-10-CM | POA: Diagnosis not present

## 2024-01-26 LAB — GLUCOSE, CAPILLARY
Glucose-Capillary: 127 mg/dL — ABNORMAL HIGH (ref 70–99)
Glucose-Capillary: 131 mg/dL — ABNORMAL HIGH (ref 70–99)

## 2024-01-26 SURGERY — THORACIC LAMINECTOMY FOR SPINAL CORD STIMULATOR
Anesthesia: General

## 2024-01-26 MED ORDER — PROPOFOL 1000 MG/100ML IV EMUL
INTRAVENOUS | Status: AC
Start: 2024-01-26 — End: 2024-01-26
  Filled 2024-01-26: qty 100

## 2024-01-26 MED ORDER — CEFAZOLIN SODIUM-DEXTROSE 2-4 GM/100ML-% IV SOLN
INTRAVENOUS | Status: AC
  Filled 2024-01-26: qty 100

## 2024-01-26 MED ORDER — SODIUM CHLORIDE (PF) 0.9 % IJ SOLN
INTRAMUSCULAR | Status: DC | PRN
  Administered 2024-01-26: 60 mL

## 2024-01-26 MED ORDER — DEXAMETHASONE SODIUM PHOSPHATE 10 MG/ML IJ SOLN
INTRAMUSCULAR | Status: AC
  Filled 2024-01-26: qty 1

## 2024-01-26 MED ORDER — CEFAZOLIN SODIUM-DEXTROSE 2-4 GM/100ML-% IV SOLN
2.0000 g | Freq: Once | INTRAVENOUS | Status: AC
  Administered 2024-01-26 (×2): 2 g via INTRAVENOUS

## 2024-01-26 MED ORDER — SUGAMMADEX SODIUM 200 MG/2ML IV SOLN
INTRAVENOUS | Status: DC | PRN
  Administered 2024-01-26: 50 mg via INTRAVENOUS

## 2024-01-26 MED ORDER — DEXAMETHASONE SODIUM PHOSPHATE 10 MG/ML IJ SOLN
INTRAMUSCULAR | Status: DC | PRN
  Administered 2024-01-26: 5 mg via INTRAVENOUS

## 2024-01-26 MED ORDER — EPHEDRINE SULFATE-NACL 50-0.9 MG/10ML-% IV SOSY
PREFILLED_SYRINGE | INTRAVENOUS | Status: DC | PRN
  Administered 2024-01-26 (×2): 10 mg via INTRAVENOUS

## 2024-01-26 MED ORDER — LACTATED RINGERS IV SOLN: INTRAVENOUS | Status: DC | PRN

## 2024-01-26 MED ORDER — IRRISEPT - 450ML BOTTLE WITH 0.05% CHG IN STERILE WATER, USP 99.95% OPTIME
TOPICAL | Status: DC | PRN
Start: 2024-01-26 — End: 2024-01-26
  Administered 2024-01-26: 450 mL

## 2024-01-26 MED ORDER — PROPOFOL 10 MG/ML IV BOLUS
INTRAVENOUS | Status: AC
  Filled 2024-01-26: qty 20

## 2024-01-26 MED ORDER — BUPIVACAINE LIPOSOME 1.3 % IJ SUSP
INTRAMUSCULAR | Status: AC
  Filled 2024-01-26: qty 60

## 2024-01-26 MED ORDER — LIDOCAINE HCL (CARDIAC) PF 100 MG/5ML IV SOSY
PREFILLED_SYRINGE | INTRAVENOUS | Status: DC | PRN
  Administered 2024-01-26: 60 mg via INTRAVENOUS

## 2024-01-26 MED ORDER — PROPOFOL 10 MG/ML IV BOLUS
INTRAVENOUS | Status: DC | PRN
  Administered 2024-01-26: 150 mg via INTRAVENOUS

## 2024-01-26 MED ORDER — PROPOFOL 500 MG/50ML IV EMUL
INTRAVENOUS | Status: DC | PRN
  Administered 2024-01-26: 120 ug/kg/min via INTRAVENOUS

## 2024-01-26 MED ORDER — REMIFENTANIL HCL 1 MG IV SOLR
INTRAVENOUS | Status: AC
  Filled 2024-01-26: qty 1000

## 2024-01-26 MED ORDER — VANCOMYCIN HCL IN DEXTROSE 1-5 GM/200ML-% IV SOLN
1000.0000 mg | Freq: Once | INTRAVENOUS | Status: AC
  Administered 2024-01-26: 1000 mg via INTRAVENOUS

## 2024-01-26 MED ORDER — 0.9 % SODIUM CHLORIDE (POUR BTL) OPTIME
TOPICAL | Status: DC | PRN
  Administered 2024-01-26: 300 mL

## 2024-01-26 MED ORDER — OXYCODONE HCL 5 MG PO TABS
5.0000 mg | ORAL_TABLET | Freq: Once | ORAL | Status: AC | PRN
  Administered 2024-01-26: 5 mg via ORAL

## 2024-01-26 MED ORDER — SURGIFLO WITH THROMBIN (HEMOSTATIC MATRIX KIT) OPTIME
TOPICAL | Status: DC | PRN
  Administered 2024-01-26: 1 via TOPICAL

## 2024-01-26 MED ORDER — VANCOMYCIN HCL IN DEXTROSE 1-5 GM/200ML-% IV SOLN
INTRAVENOUS | Status: AC
  Filled 2024-01-26: qty 200

## 2024-01-26 MED ORDER — FENTANYL CITRATE (PF) 100 MCG/2ML IJ SOLN: 25.0000 ug | INTRAMUSCULAR | Status: DC | PRN

## 2024-01-26 MED ORDER — PHENYLEPHRINE HCL-NACL 20-0.9 MG/250ML-% IV SOLN
INTRAVENOUS | Status: DC | PRN
  Administered 2024-01-26: 50 ug/min via INTRAVENOUS

## 2024-01-26 MED ORDER — PHENYLEPHRINE 80 MCG/ML (10ML) SYRINGE FOR IV PUSH (FOR BLOOD PRESSURE SUPPORT)
PREFILLED_SYRINGE | INTRAVENOUS | Status: DC | PRN
  Administered 2024-01-26 (×2): 160 ug via INTRAVENOUS

## 2024-01-26 MED ORDER — FENTANYL CITRATE (PF) 100 MCG/2ML IJ SOLN
INTRAMUSCULAR | Status: AC
Start: 2024-01-26 — End: 2024-01-26
  Filled 2024-01-26: qty 2

## 2024-01-26 MED ORDER — OXYCODONE HCL 5 MG PO TABS
5.0000 mg | ORAL_TABLET | ORAL | 0 refills | Status: AC | PRN
  Filled 2024-01-26: qty 30, 5d supply, fill #0

## 2024-01-26 MED ORDER — MIDAZOLAM HCL 2 MG/2ML IJ SOLN
INTRAMUSCULAR | Status: DC | PRN
  Administered 2024-01-26: 2 mg via INTRAVENOUS

## 2024-01-26 MED ORDER — DROPERIDOL 2.5 MG/ML IJ SOLN
INTRAMUSCULAR | Status: AC
  Filled 2024-01-26: qty 2

## 2024-01-26 MED ORDER — NEOSTIGMINE METHYLSULFATE 10 MG/10ML IV SOLN
INTRAVENOUS | Status: AC
  Filled 2024-01-26: qty 1

## 2024-01-26 MED ORDER — ONDANSETRON HCL 4 MG/2ML IJ SOLN
INTRAMUSCULAR | Status: AC
  Filled 2024-01-26: qty 2

## 2024-01-26 MED ORDER — BUPIVACAINE-EPINEPHRINE (PF) 0.5% -1:200000 IJ SOLN
INTRAMUSCULAR | Status: DC | PRN
  Administered 2024-01-26: 10 mL

## 2024-01-26 MED ORDER — DROPERIDOL 2.5 MG/ML IJ SOLN
0.6250 mg | Freq: Once | INTRAMUSCULAR | Status: AC
  Administered 2024-01-26: 0.625 mg via INTRAVENOUS

## 2024-01-26 MED ORDER — CHLORHEXIDINE GLUCONATE 0.12 % MT SOLN
15.0000 mL | Freq: Once | OROMUCOSAL | Status: AC
  Administered 2024-01-26: 15 mL via OROMUCOSAL

## 2024-01-26 MED ORDER — BUPIVACAINE HCL (PF) 0.5 % IJ SOLN
INTRAMUSCULAR | Status: AC
  Filled 2024-01-26: qty 90

## 2024-01-26 MED ORDER — OXYCODONE HCL 5 MG PO TABS
ORAL_TABLET | ORAL | Status: AC
  Filled 2024-01-26: qty 1

## 2024-01-26 MED ORDER — SODIUM CHLORIDE 0.9 % IV SOLN: INTRAVENOUS | Status: DC

## 2024-01-26 MED ORDER — ORAL CARE MOUTH RINSE: 15.0000 mL | Freq: Once | OROMUCOSAL | Status: AC

## 2024-01-26 MED ORDER — MIDAZOLAM HCL 2 MG/2ML IJ SOLN
INTRAMUSCULAR | Status: AC
  Filled 2024-01-26: qty 2

## 2024-01-26 MED ORDER — PHENYLEPHRINE HCL-NACL 20-0.9 MG/250ML-% IV SOLN
INTRAVENOUS | Status: AC
  Filled 2024-01-26: qty 250

## 2024-01-26 MED ORDER — CHLORHEXIDINE GLUCONATE 0.12 % MT SOLN
OROMUCOSAL | Status: AC
  Filled 2024-01-26: qty 15

## 2024-01-26 MED ORDER — SUCCINYLCHOLINE CHLORIDE 200 MG/10ML IV SOSY
PREFILLED_SYRINGE | INTRAVENOUS | Status: DC | PRN
  Administered 2024-01-26: 100 mg via INTRAVENOUS

## 2024-01-26 MED ORDER — ROCURONIUM BROMIDE 100 MG/10ML IV SOLN
INTRAVENOUS | Status: DC | PRN
  Administered 2024-01-26: 20 mg via INTRAVENOUS

## 2024-01-26 MED ORDER — OXYCODONE HCL 5 MG/5ML PO SOLN: 5.0000 mg | Freq: Once | ORAL | Status: AC | PRN

## 2024-01-26 MED ORDER — REMIFENTANIL HCL 1 MG IV SOLR
INTRAVENOUS | Status: DC | PRN
  Administered 2024-01-26: .2 ug/kg/min via INTRAVENOUS

## 2024-01-26 MED ORDER — BUPIVACAINE-EPINEPHRINE (PF) 0.5% -1:200000 IJ SOLN
INTRAMUSCULAR | Status: AC
  Filled 2024-01-26: qty 30

## 2024-01-26 MED ORDER — SODIUM CHLORIDE (PF) 0.9 % IJ SOLN
INTRAMUSCULAR | Status: AC
  Filled 2024-01-26: qty 60

## 2024-01-26 MED ORDER — ONDANSETRON HCL 4 MG/2ML IJ SOLN
INTRAMUSCULAR | Status: DC | PRN
  Administered 2024-01-26: 4 mg via INTRAVENOUS

## 2024-01-26 MED ORDER — FENTANYL CITRATE (PF) 100 MCG/2ML IJ SOLN
INTRAMUSCULAR | Status: DC | PRN
  Administered 2024-01-26: 50 ug via INTRAVENOUS
  Administered 2024-01-26: 25 ug via INTRAVENOUS
  Administered 2024-01-26: 50 ug via INTRAVENOUS

## 2024-01-26 SURGICAL SUPPLY — 38 items
BUR NEURO DRILL SOFT 3.0X3.8M (BURR) ×1 IMPLANT
CONTROL REMOTE FREELINK ALPHA (NEUROSURGERY SUPPLIES) IMPLANT
DERMABOND ADVANCED .7 DNX12 (GAUZE/BANDAGES/DRESSINGS) ×2 IMPLANT
DRAPE C ARM PK CFD 31 SPINE (DRAPES) ×1 IMPLANT
DRAPE LAPAROTOMY 100X77 ABD (DRAPES) ×1 IMPLANT
DRSG OPSITE POSTOP 4X6 (GAUZE/BANDAGES/DRESSINGS) IMPLANT
DRSG OPSITE POSTOP 4X8 (GAUZE/BANDAGES/DRESSINGS) IMPLANT
ELECTRODE REM PT RTRN 9FT ADLT (ELECTROSURGICAL) ×1 IMPLANT
FEE INTRAOP CADWELL SUPPLY NCS (MISCELLANEOUS) IMPLANT
FEE INTRAOP MONITOR IMPULS NCS (MISCELLANEOUS) IMPLANT
GLOVE BIOGEL PI IND STRL 6.5 (GLOVE) ×1 IMPLANT
GLOVE SURG SYN 6.5 ES PF (GLOVE) ×1 IMPLANT
GLOVE SURG SYN 6.5 PF PI (GLOVE) ×1 IMPLANT
GLOVE SURG SYN 8.5 E (GLOVE) ×3 IMPLANT
GLOVE SURG SYN 8.5 PF PI (GLOVE) ×3 IMPLANT
GOWN SRG LRG LVL 4 IMPRV REINF (GOWNS) ×1 IMPLANT
GOWN SRG XL LVL 3 NONREINFORCE (GOWNS) ×1 IMPLANT
KIT CHARGING PRECISION NEURO (KITS) IMPLANT
KIT IPG ALPHA WAVEWRITER (Stimulator) IMPLANT
KIT SPINAL PRONEVIEW (KITS) ×1 IMPLANT
LAVAGE JET IRRISEPT WOUND (IRRIGATION / IRRIGATOR) ×1 IMPLANT
LEAD COVER EDGE 50CM STIM KIT (Lead) IMPLANT
MANIFOLD NEPTUNE II (INSTRUMENTS) ×1 IMPLANT
MARKER SKIN DUAL TIP RULER LAB (MISCELLANEOUS) ×1 IMPLANT
NDL SAFETY ECLIPSE 18X1.5 (NEEDLE) ×1 IMPLANT
NS IRRIG 500ML POUR BTL (IV SOLUTION) ×1 IMPLANT
PACK LAMINECTOMY ARMC (PACKS) ×1 IMPLANT
PASSER ELEVATOR (SPINAL CORD STIMULATOR) IMPLANT
STAPLER SKIN PROX 35W (STAPLE) ×1 IMPLANT
SURGIFLO W/THROMBIN 8M KIT (HEMOSTASIS) ×1 IMPLANT
SUT SILK 2 0SH CR/8 30 (SUTURE) ×1 IMPLANT
SUT STRATA 3-0 15 PS-2 (SUTURE) ×2 IMPLANT
SUT VIC AB 0 CT1 18XCR BRD 8 (SUTURE) ×1 IMPLANT
SUT VIC AB 2-0 CT1 18 (SUTURE) ×1 IMPLANT
SYR 10ML LL (SYRINGE) IMPLANT
SYR 30ML LL (SYRINGE) ×2 IMPLANT
TOOL LONG TUNNEL (SPINAL CORD STIMULATOR) IMPLANT
TRAP FLUID SMOKE EVACUATOR (MISCELLANEOUS) ×1 IMPLANT

## 2024-01-26 NOTE — Anesthesia Procedure Notes (Signed)
 Procedure Name: Intubation Date/Time: 01/26/2024 7:25 AM  Performed by: Bill Budd, CRNAPre-anesthesia Checklist: Patient identified, Patient being monitored, Timeout performed, Emergency Drugs available and Suction available Patient Re-evaluated:Patient Re-evaluated prior to induction Oxygen Delivery Method: Circle system utilized Preoxygenation: Pre-oxygenation with 100% oxygen Induction Type: IV induction Ventilation: Mask ventilation without difficulty Laryngoscope Size: Mac and 4 Grade View: Grade I Tube type: Oral Tube size: 7.0 mm Number of attempts: 1 Airway Equipment and Method: Stylet Placement Confirmation: ETT inserted through vocal cords under direct vision, positive ETCO2 and breath sounds checked- equal and bilateral Secured at: 21 cm Tube secured with: Tape Dental Injury: Teeth and Oropharynx as per pre-operative assessment

## 2024-01-26 NOTE — Transfer of Care (Signed)
 Immediate Anesthesia Transfer of Care Note  Patient: Sherri Dickerson  Procedure(s) Performed: THORACIC LAMINECTOMY FOR SPINAL CORD STIMULATOR  Patient Location: PACU  Anesthesia Type:General  Level of Consciousness: drowsy  Airway & Oxygen Therapy: Patient Spontanous Breathing and Patient connected to face mask oxygen  Post-op Assessment: Report given to RN and Post -op Vital signs reviewed and stable  Post vital signs: Reviewed and stable  Last Vitals:  Vitals Value Taken Time  BP 140/79 01/26/24 10:24  Temp    Pulse 92 01/26/24 10:27  Resp 14 01/26/24 10:27  SpO2 100 % 01/26/24 10:27  Vitals shown include unfiled device data.  Last Pain:  Vitals:   01/26/24 0622  TempSrc: Oral  PainSc: 4          Complications: No notable events documented.

## 2024-01-26 NOTE — Discharge Summary (Signed)
 Discharge Summary  Patient ID: Sherri Dickerson MRN: [id number] DOB/AGE: 09/02/1960 63 y.o.  Admit date: 01/26/2024 Discharge date: 01/26/2024  Admission Diagnoses: G89.4Chronic pain syndrome , M96.1 Postlaminectomy syndrome   Discharge Diagnoses:  Active Problems:   Postlaminectomy syndrome   Discharged Condition: good  Hospital Course:  Sherri Dickerson is a 63 y.o presenting with chronic pain syndrome status post placement of spinal cord stimulator.  Her intraoperative course was uncomplicated.  She was monitored in PACU and discharged home after ambulating, urinating, and tolerating p.o. intake.  She was given a prescription for oxycodone  and instructed to resume her home Robaxin .  Consults: None  Significant Diagnostic Studies: NA  Treatments: surgery: as above. Please see separately dictated operative report for further details  Discharge Exam: Blood pressure (!) 176/82, pulse 84, temperature (!) 96.8 F (36 C), temperature source Oral, resp. rate 14, height 5' 6 (1.676 m), weight 104.8 kg, SpO2 98%. CN grossly intact MAEW Incisions covered with post-op dressings  Disposition: Discharge disposition: 01-Home or Self Care       Discharge Instructions     Remove dressing in 24 hours   Complete by: As directed       Allergies as of 01/26/2024       Reactions   Liraglutide Other (See Comments)   History of thyroid  cancer   Macrobid [nitrofurantoin] Other (See Comments)   Flu like symptoms   Morphine    Headaches    Wellbutrin [bupropion] Other (See Comments)   Suicidal thoughts        Medication List     TAKE these medications    acetaminophen  500 MG tablet Commonly known as: TYLENOL  Take 1,500 mg by mouth daily as needed for moderate pain (pain score 4-6).   benzonatate 200 MG capsule Commonly known as: TESSALON Take 200 mg by mouth 3 (three) times daily as needed for cough.   Calcium  600+D 600-20 MG-MCG Tabs Generic drug: Calcium   Carb-Cholecalciferol Take 1 tablet by mouth daily.   celecoxib 100 MG capsule Commonly known as: CELEBREX Take 100 mg by mouth 2 (two) times daily.   cetirizine 10 MG tablet Commonly known as: ZYRTEC Take 10 mg by mouth daily.   citalopram  20 MG tablet Commonly known as: CELEXA  Take 20 mg by mouth at bedtime.   fenofibrate  48 MG tablet Commonly known as: TRICOR  Take 48 mg by mouth at bedtime.   gabapentin  600 MG tablet Commonly known as: NEURONTIN  Take 600 mg by mouth at bedtime.   levothyroxine  150 MCG tablet Commonly known as: SYNTHROID  Take 150 mcg by mouth daily before breakfast.   Magnesium  400 MG Tabs Take 400 mg by mouth at bedtime.   Melatonin 10 MG Caps Take 10 mg by mouth at bedtime.   metFORMIN  500 MG 24 hr tablet Commonly known as: GLUCOPHAGE -XR Take 1,000 mg by mouth at bedtime.   methocarbamol  500 MG tablet Commonly known as: ROBAXIN  TAKE 1 TABLET(500 MG) BY MOUTH EVERY 6 HOURS AS NEEDED FOR MUSCLE SPASMS What changed: See the new instructions.   MILK THISTLE PO Take 1 tablet by mouth daily.   Multi-Vitamins Tabs Take 1 tablet by mouth daily.   nortriptyline  10 MG capsule Commonly known as: PAMELOR  Take 10-20 mg by mouth See admin instructions. Take 10 mg in the morning and 20 mg at night   olmesartan 20 MG tablet Commonly known as: BENICAR Take 20 mg by mouth daily.   oxyCODONE  5 MG immediate release tablet Commonly known as: Roxicodone  Take 1  tablet (5 mg total) by mouth every 4 (four) hours as needed for up to 5 days.   RABEprazole 20 MG tablet Commonly known as: ACIPHEX Take 20 mg by mouth in the morning and at bedtime.   rosuvastatin  40 MG tablet Commonly known as: CRESTOR  Take 40 mg by mouth at bedtime.   traZODone  100 MG tablet Commonly known as: DESYREL  Take 100 mg by mouth at bedtime.   venlafaxine  XR 150 MG 24 hr capsule Commonly known as: EFFEXOR -XR Take 150 mg by mouth at bedtime.   vitamin B-12 500 MCG  tablet Commonly known as: CYANOCOBALAMIN Take 500 mcg by mouth daily.   vitamin C 1000 MG tablet Take 1,000 mg by mouth daily.   Vitamin D3 Maximum Strength 125 MCG (5000 UT) capsule Generic drug: Cholecalciferol Take 5,000 Units by mouth daily.   Zinc 50 MG Tabs Take 50 mg by mouth daily.         Signed: Noble Bateman 01/26/2024, 10:29 AM

## 2024-01-26 NOTE — Anesthesia Preprocedure Evaluation (Addendum)
 Anesthesia Evaluation  Patient identified by MRN, date of birth, ID band Patient awake    Reviewed: Allergy & Precautions, H&P , NPO status , Patient's Chart, lab work & pertinent test results, reviewed documented beta blocker date and time   Airway Mallampati: III  TM Distance: >3 FB Neck ROM: full    Dental  (+) Teeth Intact   Pulmonary sleep apnea and Continuous Positive Airway Pressure Ventilation , Current Smoker and Patient abstained from smoking., former smoker   Pulmonary exam normal        Cardiovascular Exercise Tolerance: Poor hypertension, On Medications Normal cardiovascular exam Rhythm:regular Rate:Normal     Neuro/Psych  Headaches PSYCHIATRIC DISORDERS Anxiety      Neuromuscular disease    GI/Hepatic Neg liver ROS,GERD  Medicated,,  Endo/Other  diabetesHypothyroidism    Renal/GU      Musculoskeletal   Abdominal   Peds  Hematology negative hematology ROS (+)   Anesthesia Other Findings Past Medical History: No date: Anxiety No date: Arthritis No date: Carpal tunnel syndrome, bilateral No date: Diabetes mellitus without complication (HCC) No date: Diabetic neuropathy (HCC) No date: Family history of adverse reaction to anesthesia     Comment:  daughter woke up during colonoscopy No date: Gastritis No date: GERD (gastroesophageal reflux disease) 08/23/2022: History of 2019 novel coronavirus disease (COVID-19) No date: Hyperlipidemia No date: Hypertension No date: Hypothyroidism No date: Migraines No date: Personal history of radiation therapy No date: Sleep apnea No date: Status post radiation therapy     Comment:  thyroid  2012?: Thyroid  cancer (HCC) No date: Type 2 diabetes mellitus with peripheral neuropathy (HCC) Past Surgical History: 2005: ABDOMINAL HYSTERECTOMY 2013?: COLONOSCOPY     Comment:  Dr Magdaleno Schooling 03/21/2022: COLONOSCOPY; N/A     Comment:  Procedure: COLONOSCOPY;  Surgeon:  Quintin Buckle,              DO;  Location: [facility] ENDOSCOPY;  Service:               Gastroenterology;  Laterality: N/A; 03/21/2022: ESOPHAGOGASTRODUODENOSCOPY; N/A     Comment:  Procedure: ESOPHAGOGASTRODUODENOSCOPY (EGD);  Surgeon:               Quintin Buckle, DO;  Location: [facility] ENDOSCOPY;                Service: Gastroenterology;  Laterality: N/A; 07/25/2016: FORAMINOTOMY 2 LEVEL     Comment:  Procedure: FORAMINOTOMY 2 LEVEL;  Surgeon: Berta Brittle,               MD;  Location: [facility] ORS;  Service: Neurosurgery;;  L4-5,               L3-4 07/25/2016: LUMBAR LAMINECTOMY/DECOMPRESSION MICRODISCECTOMY; Left     Comment:  Procedure: LUMBAR LAMINECTOMY/DECOMPRESSION               MICRODISCECTOMY 2 LEVELS;  Surgeon: Berta Brittle, MD;                Location: [facility] ORS;  Service: Neurosurgery;  Laterality:               Left;  L3-4 hemilaminectomy and  L4-5 full laminectomy No date: NASAL SINUS SURGERY     Comment:  x 2 No date: OOPHORECTOMY 2012?: THYROIDECTOMY     Comment:  Dr Tommye Franc No date: TONSILLECTOMY 04/24/2017: TRANSFORAMINAL LUMBAR INTERBODY FUSION (TLIF) WITH  PEDICLE SCREW FIXATION 3 LEVEL; Bilateral     Comment:  Procedure: TRANSFORAMINAL LUMBAR INTERBODY FUSION (TLIF)  WITH PEDICLE SCREW FIXATION 3 LEVEL-L3-S1;  Surgeon:               Berta Brittle, MD;  Location: [facility] ORS;  Service:               Neurosurgery;  Laterality: Bilateral; No date: tubes in ears     Comment:  when she was 63 years old BMI    Body Mass Index: 36.32 kg/m     Reproductive/Obstetrics negative OB ROS                             Anesthesia Physical Anesthesia Plan  ASA: 3  Anesthesia Plan: General ETT   Post-op Pain Management:    Induction: Intravenous  PONV Risk Score and Plan: 3 and Ondansetron , Dexamethasone  and Midazolam   Airway Management Planned: Oral ETT  Additional Equipment:   Intra-op Plan:   Post-operative Plan:  Extubation in OR  Informed Consent: I have reviewed the patients History and Physical, chart, labs and discussed the procedure including the risks, benefits and alternatives for the proposed anesthesia with the patient or authorized representative who has indicated his/her understanding and acceptance.     Dental Advisory Given  Plan Discussed with: Anesthesiologist, CRNA and Surgeon  Anesthesia Plan Comments: (Patient consented for risks of anesthesia including but not limited to:  - adverse reactions to medications - damage to eyes, teeth, lips or other oral mucosa - nerve damage due to positioning  - sore throat or hoarseness - Damage to heart, brain, nerves, lungs, other parts of body or loss of life  Patient voiced understanding and assent.)       Anesthesia Quick Evaluation

## 2024-01-26 NOTE — Interval H&P Note (Signed)
 History and Physical Interval Note:  01/26/2024 7:00 AM  Sherri Dickerson  has presented today for surgery, with the diagnosis of G89.4Chronic pain syndrome   M96.1 Postlaminectomy syndrome, Cervical region.  The various methods of treatment have been discussed with the patient and family. After consideration of risks, benefits and other options for treatment, the patient has consented to  Procedure(s) with comments: THORACIC LAMINECTOMY FOR SPINAL CORD STIMULATOR (N/A) - THORACIC LAMINECTOMY FOR SPINAL CORD STIMULATOR as a surgical intervention.  The patient's history has been reviewed, patient examined, no change in status, stable for surgery.  I have reviewed the patient's chart and labs.  Questions were answered to the patient's satisfaction.    Heart sounds normal no MRG. Chest Clear to Auscultation Bilaterally.   [name]

## 2024-01-26 NOTE — Anesthesia Postprocedure Evaluation (Signed)
 Anesthesia Post Note  Patient: Emmalene B Tuel  Procedure(s) Performed: THORACIC LAMINECTOMY FOR SPINAL CORD STIMULATOR  Patient location during evaluation: PACU Anesthesia Type: General Level of consciousness: awake and alert Pain management: pain level controlled Vital Signs Assessment: post-procedure vital signs reviewed and stable Respiratory status: spontaneous breathing, nonlabored ventilation, respiratory function stable and patient connected to nasal cannula oxygen Cardiovascular status: blood pressure returned to baseline and stable Postop Assessment: no apparent nausea or vomiting Anesthetic complications: no  No notable events documented.   Last Vitals:  Vitals:   01/26/24 1031 01/26/24 1045  BP: (!) 147/90 (!) 154/100  Pulse: 91 81  Resp: 16 13  Temp:    SpO2: 100% 97%    Last Pain:  Vitals:   01/26/24 1045  TempSrc:   PainSc: Asleep                 Enrique Harvest

## 2024-01-26 NOTE — Op Note (Signed)
 Indications: the patient is a 63 yo female who was diagnosed with G89.4Chronic pain syndrome , M96.1 Postlaminectomy syndrome . The patient had a successful trial for spinal cord stimulation, so was consented for placement of a permanent device   Findings: successful placement of a [employer] spinal cord stimulator.    Preoperative Diagnosis: G89.4Chronic pain syndrome , M96.1 Postlaminectomy syndrome  Postoperative Diagnosis: same     EBL: 25 ml IVF: see anesthesia record Drains: none Disposition: Extubated and Stable to PACU Complications: none   No foley catheter was placed.     Preoperative Note:    Risks of surgery discussed in clinic.   Operative Note:      The patient was then brought from the preoperative center with intravenous access established.  The patient underwent general anesthesia and endotracheal tube intubation, then was rotated on the [facility] table where all pressure points were appropriately padded.  An incision was marked with flouroscopy at T9/10, and on the right flank. The skin was then thoroughly cleansed.  Perioperative antibiotic prophylaxis was administered.  Sterile prep and drapes were then applied and a timeout was then observed.     Once this was complete an incision was opened with the use of a #10 blade knife in the midline at the thoracic incision.  The paraspinus muscled were subperiosteally dissected until the laminae of T9 and T10 were visualized. Flouroscopy was used to confirm the level. A self-retaining retractor was placed.   The rongeur was used to remove the spinous process of T9.  The drill was used to thin the bone until the ligamentum flavum was visualized.  The ligamentum was then removed and the dura visualized. This was widened until placement of the paddle lead was possible.     The lead was then advanced to the T7/8 disc space at the top of the lead.  The lead was secured with a 2-0 silk suture.     The incision on the flank  was then opened and a pocket formed until it was large enough for the pulse generator.  The tunneler was used to connect between the pocket and the incision.  The lead was inserted into the tunneler and tunneled to the flank.  The leads were attached to the IPG and impedances checked.  The leads were then tightened.  The IPG was then inserted into the pouch.   Both sites were irrigated.  The wounds were closed in layers with 0 and 2-0 vicryl.  The skin was approximated with monocryl. A sterile dressing was applied.   Monitoring was stable throughout.   Patient was then rotated back to the preoperative bed awakened from anesthesia and taken to recovery. All counts are correct in this case.   I performed the entire procedure with the assistance of Anastacio Karvonen PA as an Designer, television/film set. An assistant was required for this procedure due to the complexity.  The assistant provided assistance in tissue manipulation and suction, and was required for the successful and safe performance of the procedure. I performed the critical portions of the procedure.      Jodeen Munch MD

## 2024-01-26 NOTE — Discharge Instructions (Addendum)
 NEUROSURGERY DISCHARGE INSTRUCTIONS  Admission diagnosis: Chronic pain syndrome [G89.4] Postlaminectomy syndrome, cervical region [M96.1]  Operative procedure: Placement of spinal cord stimulator  What to do after you leave the hospital:  Recommended diet: regular diet. Increase protein intake to promote wound healing.  Recommended activity: no lifting, driving, or strenuous exercise for 2 weeks. You should walk multiple times per day  Special Instructions  No straining, no heavy lifting > 10lbs x 4 weeks.  Keep incision area clean and dry. May shower in 2 days. No baths or pools for 6 weeks.  Please remove dressing tomorrow, no need to apply a bandage afterwards  You have no sutures to remove, the skin is closed with adhesive  Please take pain medications as directed. Take a stool softener if on pain medications  *Regarding compression stockings-  Please wear day and night until you are walking a couple hundred feet three times a day.   Please Report any of the following: Nausea or Vomiting, Temperature is greater than 101.90F (38.1C) degrees, Dizziness, Abdominal Pain, Difficulty Breathing or Shortness of Breath, Inability to Eat, drink Fluids, or Take medications, Bleeding, swelling, or drainage from surgical incision sites, New numbness or weakness, and Bowel or bladder dysfunction to the neurosurgeon on call. How to contact us :  If you have any questions/concerns before or after surgery, you can reach us  at [phone], or you can send a mychart message. We can be reached by phone or mychart 8am-4pm, Monday-Friday.  *Please note: Calls after 4pm are forwarded to a third party answering service. Mychart messages are not routinely monitored during evenings, weekends, and holidays. Please call our office to contact the answering service for urgent concerns during non-business hours.   Additional Follow up appointments Please follow up with Ludwig Safer PA-C as scheduled in 2-3  weeks   Please see below for scheduled appointments:  Future Appointments  Date Time Provider Department Center  02/12/2024  3:00 PM Ludwig Safer, [place] [facility] None  03/05/2024  3:30 PM Jodeen Munch, [place] [facility] None  04/17/2024  3:30 PM Ludwig Safer, PA-C [facility] None

## 2024-01-29 ENCOUNTER — Encounter: Payer: Self-pay | Admitting: Neurosurgery

## 2024-02-09 ENCOUNTER — Emergency Department
Admission: EM | Admit: 2024-02-09 | Discharge: 2024-02-09 | Disposition: A | Discharge status: Home / Self Care | Attending: Emergency Medicine | Admitting: Emergency Medicine

## 2024-02-09 ENCOUNTER — Encounter: Payer: Self-pay | Admitting: Emergency Medicine

## 2024-02-09 ENCOUNTER — Other Ambulatory Visit: Payer: Self-pay

## 2024-02-09 ENCOUNTER — Emergency Department

## 2024-02-09 DIAGNOSIS — R0781 Pleurodynia: Secondary | ICD-10-CM | POA: Insufficient documentation

## 2024-02-09 LAB — COMPREHENSIVE METABOLIC PANEL WITH GFR
ALT: 34 U/L (ref 0–44)
AST: 44 U/L — ABNORMAL HIGH (ref 15–41)
Albumin: 3.9 g/dL (ref 3.5–5.0)
Alkaline Phosphatase: 48 U/L (ref 38–126)
Anion gap: 7 (ref 5–15)
BUN: 13 mg/dL (ref 8–23)
CO2: 30 mmol/L (ref 22–32)
Calcium: 9.4 mg/dL (ref 8.9–10.3)
Chloride: 103 mmol/L (ref 98–111)
Creatinine, Ser: 0.72 mg/dL (ref 0.44–1.00)
GFR, Estimated: >60 mL/min (ref >60)
Glucose, Bld: 101 mg/dL — ABNORMAL HIGH (ref 70–99)
Potassium: 4 mmol/L (ref 3.5–5.1)
Sodium: 140 mmol/L (ref 135–145)
Total Bilirubin: 0.5 mg/dL (ref 0.0–1.2)
Total Protein: 6.7 g/dL (ref 6.5–8.1)

## 2024-02-09 LAB — URINALYSIS, ROUTINE W REFLEX MICROSCOPIC
Bilirubin Urine: NEGATIVE
Glucose, UA: NEGATIVE mg/dL
Hgb urine dipstick: NEGATIVE
Ketones, ur: NEGATIVE mg/dL
Leukocytes,Ua: NEGATIVE
Nitrite: NEGATIVE
Protein, ur: NEGATIVE mg/dL
Specific Gravity, Urine: 1.013 (ref 1.005–1.030)
pH: 8 (ref 5.0–8.0)

## 2024-02-09 LAB — CBC WITH DIFFERENTIAL/PLATELET
Abs Immature Granulocytes: 0.07 10*3/uL (ref 0.00–0.07)
Basophils Absolute: 0 10*3/uL (ref 0.0–0.1)
Basophils Relative: 0 %
Eosinophils Absolute: 0.2 10*3/uL (ref 0.0–0.5)
Eosinophils Relative: 2 %
HCT: 38.6 % (ref 36.0–46.0)
Hemoglobin: 13.2 g/dL (ref 12.0–15.0)
Immature Granulocytes: 1 %
Lymphocytes Relative: 28 %
Lymphs Abs: 2.7 10*3/uL (ref 0.7–4.0)
MCH: 32.2 pg (ref 26.0–34.0)
MCHC: 34.2 g/dL (ref 30.0–36.0)
MCV: 94.1 fL (ref 80.0–100.0)
Monocytes Absolute: 0.5 10*3/uL (ref 0.1–1.0)
Monocytes Relative: 6 %
Neutro Abs: 6.3 10*3/uL (ref 1.7–7.7)
Neutrophils Relative %: 63 %
Platelets: 220 10*3/uL (ref 150–400)
RBC: 4.1 MIL/uL (ref 3.87–5.11)
RDW: 12.6 % (ref 11.5–15.5)
WBC: 9.8 10*3/uL (ref 4.0–10.5)
nRBC: 0 % (ref 0.0–0.2)

## 2024-02-09 MED ORDER — LIDOCAINE 5 % EX PTCH
1.0000 | MEDICATED_PATCH | Freq: Once | CUTANEOUS | Status: DC
Start: 2024-02-09 — End: 2024-02-09
  Administered 2024-02-09: 1 via TRANSDERMAL
  Filled 2024-02-09: qty 1

## 2024-02-09 NOTE — ED Triage Notes (Signed)
 Pt reports right sided rib/back pain that started on Wednesday. Denies any trauma to the area. Pt reports the pain is worse with movement. Pt had spinal stimulator placed 2 weeks ago.

## 2024-02-09 NOTE — ED Notes (Signed)
 See triage note  Presents with pain to right mid back and lateral rib area  States pain started 2 days ago   denies any recent injury,fever or cough

## 2024-02-09 NOTE — ED Provider Notes (Signed)
 [facility] Provider Note    Event Date/Time   First MD Initiated Contact with Patient 02/09/24 1028     (approximate)   History   Back Pain   HPI  Sherri Dickerson is a 63 y.o. female status post spinal stimulator insertion 2 weeks ago presents emergency department with right sided/right rib pain.  Patient states worse with movement.  States if she presses hard on the area it does not hurt as much.  Denies cough, congestion.  Patient is a smoker.  States she does not feel short of breath and does not feel like her chest hurts.  However the right sided/rib pain, radiates to the front.  Denies UTI symptoms      Physical Exam   Triage Vital Signs: ED Triage Vitals [02/09/24 0926]  Encounter Vitals Group     BP (!) 141/86     Girls Systolic BP Percentile      Girls Diastolic BP Percentile      Boys Systolic BP Percentile      Boys Diastolic BP Percentile      Pulse Rate 95     Resp (!) 21     Temp 98 F (36.7 C)     Temp Source Oral     SpO2 100 %     Weight      Height      Head Circumference      Peak Flow      Pain Score 10     Pain Loc      Pain Education      Exclude from Growth Chart     Most recent vital signs: Vitals:   02/09/24 0926 02/09/24 1323  BP: (!) 141/86 138/86  Pulse: 95 88  Resp: (!) 21 18  Temp: 98 F (36.7 C) 98.1 F (36.7 C)  SpO2: 100% 100%     General: Awake, no distress.   CV:  Good peripheral perfusion. regular rate and  rhythm Resp:  Normal effort. Lungs cta Abd:  No distention.   Other:  Surgical wounds healing well, no cellulitic areas noted,   ED Results / Procedures / Treatments   Labs (all labs ordered are listed, but only abnormal results are displayed) Labs Reviewed  COMPREHENSIVE METABOLIC PANEL WITH GFR - Abnormal; Notable for the following components:      Result Value   Glucose, Bld 101 (*)    AST 44 (*)    All other components within normal limits  URINALYSIS, ROUTINE W REFLEX  MICROSCOPIC - Abnormal; Notable for the following components:   Color, Urine YELLOW (*)    APPearance HAZY (*)    All other components within normal limits  CBC WITH DIFFERENTIAL/PLATELET     EKG     RADIOLOGY  Chest x-ray   PROCEDURES:   Procedures  Critical Care:  no Chief Complaint  Patient presents with   Back Pain      MEDICATIONS ORDERED IN ED: Medications  lidocaine  (LIDODERM ) 5 % 1 patch (1 patch Transdermal Patch Applied 02/09/24 1309)     IMPRESSION / MDM / ASSESSMENT AND PLAN / ED COURSE  I reviewed the triage vital signs and the nursing notes.                              Differential diagnosis includes, but is not limited to, kidney stone, CAP, UTI, muscle spasm, PE  Patient's presentation is most  consistent with acute illness / injury with system symptoms.   Cardiac monitor   Patient's labs are reassuring, chest x-ray independently reviewed interpreted by me as being negative for acute abnormality  Patient is not tachycardic, does not feel short of breath, has no chest pain so do not feel that a PE is likely. Her symptoms appear to be more consistent with either a UTI or pain from the spinal stimulator.  Will try Lidoderm  patch as patient does not want narcotics.  She is already on methocarbamol  Awaiting UA   UA reassuring  Patient had some relief with the Lidoderm  patch.  Offered to send a prescription in.  She states she would just use ice and over-the-counter medications.  She does not want a narcotic.  She is to follow-up with her regular doctor on Monday.  Return emergency department worsening.  I did go over signs and symptoms of a PE as she should return if worsening.  She is in agreement with this treatment plan.  Discharged stable condition.   FINAL CLINICAL IMPRESSION(S) / ED DIAGNOSES   Final diagnoses:  Rib pain on right side     Rx / DC Orders   ED Discharge Orders     None        Note:  This document was prepared  using Dragon voice recognition software and may include unintentional dictation errors.    Gasper Devere ORN, PA-C 02/09/24 1344    Suzanne Kirsch, MD 02/09/24 [phone]

## 2024-02-09 NOTE — Discharge Instructions (Addendum)
 Follow-up with your regular doctor.  Return emergency department worsening Apply ice to the right ribs.  Take the muscle relaxer more than once a day. Return emergency department if chest pain, shortness of breath, or heart is racing

## 2024-02-12 ENCOUNTER — Ambulatory Visit: Admitting: Physician Assistant

## 2024-02-12 ENCOUNTER — Encounter: Payer: Self-pay | Admitting: Physician Assistant

## 2024-02-12 VITALS — BP 138/72 | Temp 98.7°F | Ht 66.0 in | Wt 229.0 lb

## 2024-02-12 DIAGNOSIS — M961 Postlaminectomy syndrome, not elsewhere classified: Secondary | ICD-10-CM

## 2024-02-12 DIAGNOSIS — Z9689 Presence of other specified functional implants: Secondary | ICD-10-CM

## 2024-02-12 MED ORDER — TIZANIDINE HCL 4 MG PO TABS: 2.0000 mg | ORAL_TABLET | Freq: Three times a day (TID) | ORAL | 1 refills | Status: AC | PRN

## 2024-02-12 NOTE — Progress Notes (Signed)
   REFERRING PHYSICIAN:  Cyrus Selinda Moose, Pa-c [address] [place],  [place] [address]  DOS: 01/26/2024, thoracic laminectomy for spinal cord stimulator placement  HISTORY OF PRESENT ILLNESS: Sherri Dickerson is approximately 2 weeks status post spinal cord stimulator placement. she is doing okay.  She has been experiencing quite a bit of pain.  However she is not been taking any pain medicine besides her longstanding Celebrex and occasional Robaxin .  She does not want to take any narcotic pain medication.  No new weakness.  Endorses some right-sided rib pain.     PHYSICAL EXAMINATION:  General: Patient is well developed, well nourished, calm, collected, and in no apparent distress.   NEUROLOGICAL:  General: In no acute distress.   Awake, alert, oriented to person, place, and time.  Pupils equal round and reactive to light.  Facial tone is symmetric.     Strength:            Side Iliopsoas Quads Hamstring PF DF EHL  R 5 5 5 5 5 5   L 5 5 5 5 5 5    Incision c/d/I.  Some delayed healing at scab formation at battery incision site.   ROS (Neurologic):  Negative except as noted above  IMAGING: No new imaging since she was seen.  ASSESSMENT/PLAN:  Sherri Dickerson is doing well approximately 2 weeks after thoracic spine operation and placement. she will follow up in approximately 1 month for 6-week follow-up.I have advised the patient to lift up to 10 pounds until 6 weeks after surgery, then increase up to 25 pounds until 12 weeks after surgery.  After 12 weeks post-op, the patient advised to increase activity as tolerated.  Representative for stimulator company was present during today's visit to help optimize her stimulator.  Counseled patient at length importance of using pain medication if she is in pain.  She states she will not use narcotic pain medication or Tylenol .  We did discuss changing her muscle relaxer from Robaxin  to tizanidine.  Advised to contact the office  if any questions or concerns arise.  Lyle Decamp PA-C Department of neurosurgery

## 2024-03-05 ENCOUNTER — Ambulatory Visit (INDEPENDENT_AMBULATORY_CARE_PROVIDER_SITE_OTHER): Admitting: Neurosurgery

## 2024-03-05 ENCOUNTER — Encounter: Payer: Self-pay | Admitting: Neurosurgery

## 2024-03-05 VITALS — BP 132/82 | Ht 66.0 in | Wt 229.0 lb

## 2024-03-05 DIAGNOSIS — Z9689 Presence of other specified functional implants: Secondary | ICD-10-CM

## 2024-03-05 DIAGNOSIS — M961 Postlaminectomy syndrome, not elsewhere classified: Secondary | ICD-10-CM

## 2024-03-05 NOTE — Progress Notes (Signed)
   REFERRING PHYSICIAN:  Cyrus Selinda Moose, Pa-c [address] [place],  [place] [address]  DOS: 01/26/2024, thoracic laminectomy for spinal cord stimulator placement  HISTORY OF PRESENT ILLNESS: Sherri Dickerson is status post spinal cord stimulator placement.   She is about 50% improved.  She would like to have more improvements.    PHYSICAL EXAMINATION:  General: Patient is well developed, well nourished, calm, collected, and in no apparent distress.   NEUROLOGICAL:  General: In no acute distress.   Awake, alert, oriented to person, place, and time.  Pupils equal round and reactive to light.  Facial tone is symmetric.     Strength:            Side Iliopsoas Quads Hamstring PF DF EHL  R 5 5 5 5 5 5   L 5 5 5 5 5 5    Incision c/d/I in the midline incision.  Some delayed healing at scab formation at battery incision site. No drainage or warmth.   ROS (Neurologic):  Negative except as noted above  IMAGING: No complications noted on chest xray 6/27  ASSESSMENT/PLAN:  Sherri Dickerson is doing better after SCS.    We will continue to watch her incision.  She will work with the reps on programming.  Reeves Daisy MD Department of neurosurgery

## 2024-03-19 ENCOUNTER — Ambulatory Visit (INDEPENDENT_AMBULATORY_CARE_PROVIDER_SITE_OTHER): Admitting: Neurosurgery

## 2024-03-19 VITALS — Ht 66.0 in | Wt 229.0 lb

## 2024-03-19 DIAGNOSIS — T148XXA Other injury of unspecified body region, initial encounter: Secondary | ICD-10-CM

## 2024-03-19 DIAGNOSIS — M961 Postlaminectomy syndrome, not elsewhere classified: Secondary | ICD-10-CM

## 2024-03-19 DIAGNOSIS — Z9689 Presence of other specified functional implants: Secondary | ICD-10-CM

## 2024-03-19 NOTE — Progress Notes (Signed)
   REFERRING PHYSICIAN:  Cyrus Selinda Moose, Pa-c [address] [place],  [place] [address]  DOS: 01/26/2024, thoracic laminectomy for spinal cord stimulator placement  HISTORY OF PRESENT ILLNESS:  03/19/24: Presents today for incision check. She continues to be without fevers, chills or drainage from the incision. There is still stabbing but per the patient the appearance has improved.   03/05/24 Sherri Dickerson is status post spinal cord stimulator placement.   She is about 50% improved.  She would like to have more improvements.  PHYSICAL EXAMINATION:  General: Patient is well developed, well nourished, calm, collected, and in no apparent distress.   NEUROLOGICAL:  General: In no acute distress.   Awake, alert, oriented to person, place, and time.   Strength:            Side Iliopsoas Quads Hamstring PF DF EHL  R 5 5 5 5 5 5   L 5 5 5 5 5 5    Incision c/d/I in the midline incision.  Some delayed healing at scab formation at battery incision site. No drainage or warmth.   ROS (Neurologic):  Negative except as noted above  IMAGING: No interval images to review   ASSESSMENT/PLAN:  Sherri Dickerson is doing better after SCS despite delaying wound healing over the battery site. We do not have a photo to compare to her last visit but will take one today and have her send photos weekly until it has healed. There does not appear to be any signed of infection.   We will continue to watch her incision.  She will work with the reps on programming.  Edsel Goods PA-C Department of neurosurgery

## 2024-04-17 ENCOUNTER — Encounter: Admitting: Physician Assistant

## 2024-06-04 ENCOUNTER — Ambulatory Visit (INDEPENDENT_AMBULATORY_CARE_PROVIDER_SITE_OTHER)

## 2024-06-04 ENCOUNTER — Ambulatory Visit (INDEPENDENT_AMBULATORY_CARE_PROVIDER_SITE_OTHER): Admitting: Physician Assistant

## 2024-06-04 ENCOUNTER — Encounter: Payer: Self-pay | Admitting: Physician Assistant

## 2024-06-04 VITALS — BP 134/72 | Ht 66.0 in | Wt 224.4 lb

## 2024-06-04 DIAGNOSIS — Z9682 Presence of neurostimulator: Secondary | ICD-10-CM | POA: Diagnosis not present

## 2024-06-04 DIAGNOSIS — G8929 Other chronic pain: Secondary | ICD-10-CM

## 2024-06-04 DIAGNOSIS — M5442 Lumbago with sciatica, left side: Secondary | ICD-10-CM | POA: Diagnosis not present

## 2024-06-04 DIAGNOSIS — M545 Low back pain, unspecified: Secondary | ICD-10-CM | POA: Diagnosis not present

## 2024-06-04 MED ORDER — METHYLPREDNISOLONE 4 MG PO TBPK: ORAL_TABLET | ORAL | 0 refills | Status: DC

## 2024-06-04 NOTE — Progress Notes (Signed)
   REFERRING PHYSICIAN:  Cyrus Selinda Moose, Pa-c [address] [place],  [place] [address]  DOS: 01/26/2024, thoracic laminectomy for spinal cord stimulator placement  HISTORY OF PRESENT ILLNESS:  Patient has longstanding history of back pain, most recently had a spinal cord stimulator placed approximately 4 months ago.  She presents today with new left low back pain that is nonradiating in nature.  She states it is constant she is unable to have relief with Tylenol , Celebrex, tizanidine . She is very frustrated with her stimulator she does not feel as though it is helping.  Often when she turns it up she does not like how it makes her feel.  Currently with her back pain she is only getting relief for short time in her recliner.   PHYSICAL EXAMINATION:  General: Patient is well developed, well nourished, calm, collected, and in no apparent distress.   NEUROLOGICAL:  General: In no acute distress.   Awake, alert, oriented to person, place, and time.   No straight leg raise.  Mild tenderness palpation of lumbar paraspinals on the left side.  Strength:            Side Iliopsoas Quads Hamstring PF DF EHL  R 5 5 5 5 5 5   L 5 5 5 5 5 5    Incisions are completely healed.  ROS (Neurologic):  Negative except as noted above  IMAGING: No interval images to review   ASSESSMENT/PLAN:  Sherri Dickerson is 4 months status post spinal cord stimulator placement who presents today with new left low back pain that is not radiating in nature currently unrelieved by present pain regimen.  She also is frustrated with her stimulator and does not feel as though this is helping either.  She denies any inciting event.  Plan includes the following:  -X-rays today to evaluate for any fracture or change in stimulator or stimulator leads.  Will review once complete. - Short dose of Medrol  Dosepak to help relieve her pain. - Encourage patient to try to troubleshoot her stimulator more to try to help  with her pain.  Ice, heat, lidocaine  is also encouraged.   Lyle Decamp PA-C Department of neurosurgery

## 2024-06-06 ENCOUNTER — Ambulatory Visit: Payer: Self-pay | Admitting: Physician Assistant

## 2024-09-10 ENCOUNTER — Observation Stay
Admission: EM | Admit: 2024-09-10 | Discharge: 2024-09-12 | Disposition: A | Discharge status: Home / Self Care | Attending: Hospitalist | Admitting: Hospitalist

## 2024-09-10 ENCOUNTER — Emergency Department

## 2024-09-10 ENCOUNTER — Other Ambulatory Visit: Payer: Self-pay

## 2024-09-10 DIAGNOSIS — S82891A Other fracture of right lower leg, initial encounter for closed fracture: Secondary | ICD-10-CM | POA: Diagnosis present

## 2024-09-10 DIAGNOSIS — W19XXXA Unspecified fall, initial encounter: Principal | ICD-10-CM

## 2024-09-10 LAB — CBC
HCT: 40.4 % (ref 36.0–46.0)
Hemoglobin: 13.8 g/dL (ref 12.0–15.0)
MCH: 31.7 pg (ref 26.0–34.0)
MCHC: 34.2 g/dL (ref 30.0–36.0)
MCV: 92.9 fL (ref 80.0–100.0)
Platelets: 223 10*3/uL (ref 150–400)
RBC: 4.35 MIL/uL (ref 3.87–5.11)
RDW: 12.9 % (ref 11.5–15.5)
WBC: 9.3 10*3/uL (ref 4.0–10.5)
nRBC: 0 % (ref 0.0–0.2)

## 2024-09-10 LAB — TSH: TSH: 3.28 u[IU]/mL (ref 0.350–4.500)

## 2024-09-10 LAB — COMPREHENSIVE METABOLIC PANEL WITH GFR
ALT: 33 U/L (ref 0–44)
AST: 36 U/L (ref 15–41)
Albumin: 4.6 g/dL (ref 3.5–5.0)
Alkaline Phosphatase: 67 U/L (ref 38–126)
Anion gap: 11 (ref 5–15)
BUN: 11 mg/dL (ref 8–23)
CO2: 29 mmol/L (ref 22–32)
Calcium: 9.9 mg/dL (ref 8.9–10.3)
Chloride: 101 mmol/L (ref 98–111)
Creatinine, Ser: 0.94 mg/dL (ref 0.44–1.00)
GFR, Estimated: >60 mL/min
Glucose, Bld: 116 mg/dL — ABNORMAL HIGH (ref 70–99)
Potassium: 3.8 mmol/L (ref 3.5–5.1)
Sodium: 140 mmol/L (ref 135–145)
Total Bilirubin: 0.3 mg/dL (ref 0.0–1.2)
Total Protein: 7.1 g/dL (ref 6.5–8.1)

## 2024-09-10 LAB — GLUCOSE, CAPILLARY: Glucose-Capillary: 104 mg/dL — ABNORMAL HIGH (ref 70–99)

## 2024-09-10 MED ORDER — NORTRIPTYLINE HCL 10 MG PO CAPS
20.0000 mg | ORAL_CAPSULE | Freq: Every day | ORAL | Status: DC
  Administered 2024-09-10 – 2024-09-11 (×2): 20 mg via ORAL
  Filled 2024-09-10 (×2): qty 2

## 2024-09-10 MED ORDER — CEFAZOLIN SODIUM-DEXTROSE 2-4 GM/100ML-% IV SOLN
2.0000 g | INTRAVENOUS | Status: AC
  Administered 2024-09-11: 2 g via INTRAVENOUS

## 2024-09-10 MED ORDER — TRAZODONE HCL 100 MG PO TABS
100.0000 mg | ORAL_TABLET | Freq: Every day | ORAL | Status: DC
  Administered 2024-09-10 – 2024-09-11 (×2): 100 mg via ORAL
  Filled 2024-09-10 (×2): qty 1

## 2024-09-10 MED ORDER — FENOFIBRATE 54 MG PO TABS
54.0000 mg | ORAL_TABLET | Freq: Every day | ORAL | Status: DC
  Administered 2024-09-10 – 2024-09-11 (×2): 54 mg via ORAL
  Filled 2024-09-10 (×2): qty 1

## 2024-09-10 MED ORDER — METHOCARBAMOL 500 MG PO TABS
500.0000 mg | ORAL_TABLET | Freq: Every day | ORAL | Status: DC
  Administered 2024-09-10 – 2024-09-11 (×2): 500 mg via ORAL
  Filled 2024-09-10 (×2): qty 1

## 2024-09-10 MED ORDER — MAGNESIUM HYDROXIDE 400 MG/5ML PO SUSP: 30.0000 mL | Freq: Every day | ORAL | Status: DC | PRN

## 2024-09-10 MED ORDER — MELATONIN 5 MG PO TABS: 10.0000 mg | ORAL_TABLET | Freq: Every evening | ORAL | Status: DC | PRN

## 2024-09-10 MED ORDER — LEVOTHYROXINE SODIUM 50 MCG PO TABS
150.0000 ug | ORAL_TABLET | Freq: Every day | ORAL | Status: DC
  Administered 2024-09-11 – 2024-09-12 (×2): 150 ug via ORAL
  Filled 2024-09-10 (×2): qty 3

## 2024-09-10 MED ORDER — PANTOPRAZOLE SODIUM 40 MG PO TBEC
40.0000 mg | DELAYED_RELEASE_TABLET | Freq: Every day | ORAL | Status: DC
  Administered 2024-09-10 – 2024-09-11 (×2): 40 mg via ORAL
  Filled 2024-09-10 (×2): qty 1

## 2024-09-10 MED ORDER — ACETAMINOPHEN 650 MG RE SUPP: 650.0000 mg | Freq: Four times a day (QID) | RECTAL | Status: DC | PRN

## 2024-09-10 MED ORDER — PANTOPRAZOLE SODIUM 40 MG PO TBEC: 40.0000 mg | DELAYED_RELEASE_TABLET | Freq: Every day | ORAL | Status: DC

## 2024-09-10 MED ORDER — ACETAMINOPHEN 325 MG PO TABS: 650.0000 mg | ORAL_TABLET | Freq: Four times a day (QID) | ORAL | Status: DC | PRN

## 2024-09-10 MED ORDER — INSULIN ASPART 100 UNIT/ML IJ SOLN: 0.0000 [IU] | Freq: Every day | INTRAMUSCULAR | Status: DC

## 2024-09-10 MED ORDER — CITALOPRAM HYDROBROMIDE 10 MG PO TABS
20.0000 mg | ORAL_TABLET | Freq: Every day | ORAL | Status: DC
  Administered 2024-09-10 – 2024-09-11 (×2): 20 mg via ORAL
  Filled 2024-09-10 (×2): qty 2

## 2024-09-10 MED ORDER — ONDANSETRON HCL 4 MG PO TABS: 4.0000 mg | ORAL_TABLET | Freq: Four times a day (QID) | ORAL | Status: DC | PRN

## 2024-09-10 MED ORDER — ENOXAPARIN SODIUM 60 MG/0.6ML IJ SOSY
0.5000 mg/kg | PREFILLED_SYRINGE | INTRAMUSCULAR | Status: DC
  Administered 2024-09-10 – 2024-09-11 (×2): 50 mg via SUBCUTANEOUS
  Filled 2024-09-10 (×2): qty 0.6

## 2024-09-10 MED ORDER — ONDANSETRON HCL 4 MG/2ML IJ SOLN: 4.0000 mg | Freq: Four times a day (QID) | INTRAMUSCULAR | Status: DC | PRN

## 2024-09-10 MED ORDER — INSULIN ASPART 100 UNIT/ML IJ SOLN: 0.0000 [IU] | Freq: Three times a day (TID) | INTRAMUSCULAR | Status: DC

## 2024-09-10 MED ORDER — NORTRIPTYLINE HCL 10 MG PO CAPS
10.0000 mg | ORAL_CAPSULE | Freq: Every morning | ORAL | Status: DC
  Administered 2024-09-11 – 2024-09-12 (×2): 10 mg via ORAL
  Filled 2024-09-10 (×2): qty 1

## 2024-09-10 MED ORDER — CELECOXIB 100 MG PO CAPS
100.0000 mg | ORAL_CAPSULE | Freq: Two times a day (BID) | ORAL | Status: DC
  Administered 2024-09-10 – 2024-09-12 (×4): 100 mg via ORAL
  Filled 2024-09-10 (×4): qty 1

## 2024-09-10 MED ORDER — OLMESARTAN MEDOXOMIL-HCTZ 20-12.5 MG PO TABS: 1.0000 | ORAL_TABLET | Freq: Every day | ORAL | Status: DC

## 2024-09-10 MED ORDER — VENLAFAXINE HCL ER 150 MG PO CP24
150.0000 mg | ORAL_CAPSULE | Freq: Every day | ORAL | Status: DC
  Administered 2024-09-10 – 2024-09-11 (×2): 150 mg via ORAL
  Filled 2024-09-10 (×2): qty 1

## 2024-09-10 MED ORDER — IRBESARTAN 150 MG PO TABS
150.0000 mg | ORAL_TABLET | Freq: Every day | ORAL | Status: DC
  Administered 2024-09-11 – 2024-09-12 (×2): 150 mg via ORAL
  Filled 2024-09-10 (×2): qty 1

## 2024-09-10 MED ORDER — OXYCODONE HCL 5 MG PO TABS: 5.0000 mg | ORAL_TABLET | ORAL | Status: DC | PRN

## 2024-09-10 MED ORDER — ROSUVASTATIN CALCIUM 20 MG PO TABS
40.0000 mg | ORAL_TABLET | Freq: Every day | ORAL | Status: DC
  Administered 2024-09-10 – 2024-09-11 (×2): 40 mg via ORAL
  Filled 2024-09-10 (×2): qty 2

## 2024-09-10 MED ORDER — HYDROCHLOROTHIAZIDE 12.5 MG PO TABS
12.5000 mg | ORAL_TABLET | Freq: Every day | ORAL | Status: DC
  Administered 2024-09-11: 12.5 mg via ORAL
  Filled 2024-09-10: qty 1

## 2024-09-10 NOTE — ED Provider Notes (Signed)
 "   [facility] Emergency Department Provider Note     Event Date/Time   First MD Initiated Contact with Patient 09/10/24 1542     (approximate)   History   Fall   HPI  Sherri Dickerson is Dickerson 64 y.o. female with Dickerson past medical history of diabetes, osteopenia, osteoarthritis and HTN presents to the ED for evaluation of right ankle pain and swelling after falling on the ice.  Patient reports she was walking to her mailbox when she slipped and fell twisting her ankle.  No head injury or LOC.  Patient is unable to bear weight on the right ankle.  Patient has injured this same ankle in the past.  No other complaint.     Physical Exam   Triage Vital Signs: ED Triage Vitals [09/10/24 1530]  Encounter Vitals Group     BP (!) 143/69     Girls Systolic BP Percentile      Girls Diastolic BP Percentile      Boys Systolic BP Percentile      Boys Diastolic BP Percentile      Pulse Rate 95     Resp 17     Temp 98.4 F (36.9 C)     Temp Source Oral     SpO2 95 %     Weight 216 lb (98 kg)     Height 5' 6 (1.676 m)     Head Circumference      Peak Flow      Pain Score 2     Pain Loc      Pain Education      Exclude from Growth Chart     Most recent vital signs: Vitals:   09/10/24 1530  BP: (!) 143/69  Pulse: 95  Resp: 17  Temp: 98.4 F (36.9 C)  SpO2: 95%    General Awake, no distress.  HEENT NCAT CV:  Good peripheral perfusion.  RESP:  Normal effort.  ABD:  No distention.  Other:  Moderate swelling to right ankle.  Tenderness to palpation more prominent to lateral malleolus. Limited range of motion at ankle joint.  Good capillary refill.  Neurovascular status intact all throughout.   ED Results / Procedures / Treatments   Labs (all labs ordered are listed, but only abnormal results are displayed) Labs Reviewed  COMPREHENSIVE METABOLIC PANEL WITH GFR  CBC    RADIOLOGY  I personally viewed and evaluated these images as part of my medical  decision making, as well as reviewing the written report by the radiologist.  DG Ankle Complete Right Result Date: 09/10/2024 CLINICAL DATA:  Deformity post fall. EXAM: RIGHT ANKLE - COMPLETE 3+ VIEW COMPARISON:  None Available. FINDINGS: Mildly displaced spiral fracture involving the distal fibula. Fibular fracture is above the ankle joint. Fracture involving the distal tibia along the lateral aspect of the ankle joint. There is Dickerson displaced bone fragment at this fracture. Prominent spur involving the medial malleolus and difficult to exclude Dickerson subtle fracture in this area. Soft tissue swelling in the ankle. Ankle is located. Spurring along the plantar aspect of the calcaneus. IMPRESSION: 1. Mildly displaced fracture involving the distal fibula. 2. Intra-articular displaced fracture involving the distal tibia along the lateral aspect of the ankle joint. 3. Prominent spur involving the medial malleolus. Difficult to exclude Dickerson subtle fracture in this area. Electronically Signed   By: Juliene Balder M.D.   On: 09/10/2024 16:33    PROCEDURES:  Critical Care performed: No  Procedures   MEDICATIONS ORDERED IN ED: Medications - No data to display   IMPRESSION / MDM / ASSESSMENT AND PLAN / ED COURSE  I reviewed the triage vital signs and the nursing notes.                              Clinical Course as of 09/10/24 1904  Tue Sep 10, 2024  1558 Pain meds offered however patient declined. [MH]  1640 Spoke with podiatry, Baker DPM. He will look at scans and get back with recommendations.  [MH]  1640 DG Ankle Complete Right IMPRESSION: 1. Mildly displaced fracture involving the distal fibula. 2. Intra-articular displaced fracture involving the distal tibia along the lateral aspect of the ankle joint. 3. Prominent spur involving the medial malleolus. Difficult to exclude Dickerson subtle fracture in this area.     [MH]  1903 Patient accepted to hospital service. Transferring care to Dr. Collie  [MH]     Clinical Course User Index [MH] Sherri Monte A, PA-C    64 y.o. female presents to the emergency department for evaluation and treatment of acute right ankle injury following mechanical fall on ice. See HPI for further details.   Differential diagnosis includes, but is not limited to fracture, dislocation, sprain  Patient's presentation is most consistent with acute complicated illness / injury requiring diagnostic workup.  Patient is alert and oriented.  She is hemodynamic stable.  On general assessment patient is well-appearing and in no acute distress.  X-ray of right ankle reveals mildly displaced fracture involving the distal fibula and intra-articular displaced fracture involving the distal tibial along the lateral aspect of the ankle joint.  Podiatry was consulted.  Patient will be placed in posterior splint and admitted for surgery tomorrow with Dr. Lennie. Will order CT scan and basic labs.   Will reach out to hospitalist team for admission.The patient is in agreement with this care plan.    FINAL CLINICAL IMPRESSION(S) / ED DIAGNOSES   Final diagnoses:  Fall, initial encounter  Closed fracture of right ankle, initial encounter   Rx / DC Orders   ED Discharge Orders     None      Note:  This document was prepared using Dragon voice recognition software and may include unintentional dictation errors.    Sherri, Sherri Starnes A, PA-C 09/10/24 1725    Sherri Drivers, MD 09/10/24 1915  "

## 2024-09-10 NOTE — ED Triage Notes (Signed)
 First nurse note: pt to ED [facility] from home slipped in driveway. C/o right ankle pain. Denies hitting head or LOC.

## 2024-09-10 NOTE — H&P (Signed)
 " History and Physical    PatientBETHA GRACEMARIE SKEET FMW:[id number] DOB: October 02, 1960 DOA: 09/10/2024 DOS: the patient was seen and examined on 09/10/2024 PCP: Cyrus Selinda Moose, PA-C  Patient coming from: Home  Chief Complaint:  Chief Complaint  Patient presents with   Fall   HPI: TERRESSA EVOLA is a 64 y.o. female with medical history significant of hypothyroidism, HLD, T2DM, neuropathy and chronic back pain, presents the emergency department for evaluation of left ankle pain and swelling after a mechanical fall on the ice earlier today.  She was previously well and has no other complaints. No LOC or other injury sustained.   She was found to have acute  displaced intra-articular distal tibial fracture, acute comminuted and displaced Tran syndesmotic distal fibular fracture, displaced avulsion fracture of the medial malleolus.  ED provider discussed the case with on-call podiatry who plans on taking her to the OR tomorrow. Hospitalist consulted to admit the patient.     Review of Systems: Review of Systems  Constitutional:  Negative for chills, fever, malaise/fatigue and weight loss.  Respiratory:  Negative for cough and shortness of breath.   Cardiovascular:  Negative for chest pain and leg swelling.  Gastrointestinal:  Negative for abdominal pain, constipation, diarrhea, heartburn, nausea and vomiting.  Genitourinary:  Negative for dysuria and frequency.  Musculoskeletal:  Positive for back pain (chronic), falls and joint pain.  Skin:  Negative for itching and rash.  Neurological:  Negative for dizziness, tingling, speech change, weakness and headaches.  Psychiatric/Behavioral:  Negative for depression, hallucinations and substance abuse.     Past Medical History:  Diagnosis Date   Anxiety    Arthritis    Carpal tunnel syndrome, bilateral    Diabetes mellitus without complication (HCC)    Diabetic neuropathy (HCC)    Essential hypertension    Family history of adverse  reaction to anesthesia    daughter woke up during colonoscopy   Gastritis    GERD (gastroesophageal reflux disease)    Hepatic steatosis    History of 2019 novel coronavirus disease (COVID-19) 08/23/2022   Hyperlipidemia    Hypothyroidism    Migraines    Osteoarthritis    Osteopenia    Personal history of radiation therapy    Sleep apnea    Status post radiation therapy    thyroid    Thyroid  cancer (HCC) 2008   Type 2 diabetes mellitus with peripheral neuropathy (HCC)    Past Surgical History:  Procedure Laterality Date   ANTERIOR LUMBAR FUSION N/A 10/24/2022   Procedure: L2-3 LATERAL LUMBAR INTERBODY FUSION (NUVASIVE), L2-3 POSTERIOR COLUMN OSTEOTOMY;  Surgeon: Clois Fret, MD;  Location: [facility] ORS;  Service: Neurosurgery;  Laterality: N/A;   APPLICATION OF INTRAOPERATIVE CT SCAN N/A 10/24/2022   Procedure: APPLICATION OF INTRAOPERATIVE CT SCAN;  Surgeon: Clois Fret, MD;  Location: [facility]S;  Service: Neurosurgery;  Laterality: N/A;   COLONOSCOPY  06/14/2011   Dr Luellen   COLONOSCOPY N/A 03/21/2022   Procedure: COLONOSCOPY;  Surgeon: Onita Elspeth Sharper, DO;  Location: [facility] ENDOSCOPY;  Service: Gastroenterology;  Laterality: N/A;   ESOPHAGOGASTRODUODENOSCOPY N/A 03/21/2022   Procedure: ESOPHAGOGASTRODUODENOSCOPY (EGD);  Surgeon: Onita Elspeth Sharper, DO;  Location: [facility] ENDOSCOPY;  Service: Gastroenterology;  Laterality: N/A;   ESOPHAGOGASTRODUODENOSCOPY  05/04/2009   FORAMINOTOMY 1 LEVEL N/A 10/24/2022   Procedure: LEFT L5-S1 FORAMINOTOMY;  Surgeon: Clois Fret, MD;  Location: [facility] ORS;  Service: Neurosurgery;  Laterality: N/A;   FORAMINOTOMY 2 LEVEL  07/25/2016   Procedure: FORAMINOTOMY 2 LEVEL;  Surgeon:  Elspeth Ahle, MD;  Location: [facility] ORS;  Service: Neurosurgery;;  L4-5, L3-4   LUMBAR LAMINECTOMY/DECOMPRESSION MICRODISCECTOMY Left 07/25/2016   Procedure: LUMBAR LAMINECTOMY/DECOMPRESSION MICRODISCECTOMY 2 LEVELS;  Surgeon: Elspeth Ahle, MD;  Location:  [facility] ORS;  Service: Neurosurgery;  Laterality: Left;  L3-4 hemilaminectomy and  L4-5 full laminectomy   NASAL SINUS SURGERY     x 2   OOPHORECTOMY     POSTERIOR LUMBAR FUSION 4 LEVEL N/A 10/24/2022   Procedure: L2-S2 POSTERIOR SPINAL FUSION;  Surgeon: Clois Fret, MD;  Location: [facility] ORS;  Service: Neurosurgery;  Laterality: N/A;   THORACIC LAMINECTOMY FOR SPINAL CORD STIMULATOR N/A 01/26/2024   Procedure: THORACIC LAMINECTOMY FOR SPINAL CORD STIMULATOR;  Surgeon: Clois Fret, MD;  Location: [facility] ORS;  Service: Neurosurgery;  Laterality: N/A;  THORACIC LAMINECTOMY FOR SPINAL CORD STIMULATOR   THYROIDECTOMY, COMPLETION  08/13/2007   THYROIDECTOMY, PARTIAL Left 07/09/2007   Dr Edda   TONSILLECTOMY AND ADENOIDECTOMY     TOTAL ABDOMINAL HYSTERECTOMY W/ BILATERAL SALPINGOOPHORECTOMY  2005   TRANSFORAMINAL LUMBAR INTERBODY FUSION (TLIF) WITH PEDICLE SCREW FIXATION 3 LEVEL Bilateral 04/24/2017   Procedure: TRANSFORAMINAL LUMBAR INTERBODY FUSION (TLIF) WITH PEDICLE SCREW FIXATION 3 LEVEL-L3-S1;  Surgeon: Ahle Elspeth, MD;  Location: [facility] ORS;  Service: Neurosurgery;  Laterality: Bilateral;   TUBAL LIGATION  1995   TYMPANOSTOMY TUBE PLACEMENT  1965   URETHRAL DILATION  1967   Social History:  reports that she has been smoking cigarettes. She started smoking about 2 years ago. She has a 1 pack-year smoking history. She has never used smokeless tobacco. She reports that she does not currently use alcohol. She reports that she does not use drugs.  Allergies[1]  Family History  Problem Relation Age of Onset   Diabetes Mother    Dementia Father    Breast cancer Neg Hx     Prior to Admission medications  Medication Sig Start Date End Date Taking? Authorizing Provider  acetaminophen  (TYLENOL ) 500 MG tablet Take 1,500 mg by mouth daily as needed for moderate pain (pain score 4-6).    [provider]  Ascorbic Acid (VITAMIN C) 1000 MG tablet Take 1,000 mg by mouth daily.     [provider]  benzonatate (TESSALON) 200 MG capsule Take 200 mg by mouth 3 (three) times daily as needed for cough.    [provider]  Calcium  Carb-Cholecalciferol (CALCIUM  600+D) 600-20 MG-MCG TABS Take 1 tablet by mouth daily.    [provider]  celecoxib  (CELEBREX ) 100 MG capsule Take 100 mg by mouth 2 (two) times daily.    [provider]  cetirizine (ZYRTEC) 10 MG tablet Take 10 mg by mouth daily.    [provider]  Cholecalciferol (VITAMIN D3 MAXIMUM STRENGTH) 125 MCG (5000 UT) capsule Take 5,000 Units by mouth daily.    [provider]  citalopram  (CELEXA ) 20 MG tablet Take 20 mg by mouth at bedtime.    [provider]  fenofibrate  (TRICOR ) 48 MG tablet Take 48 mg by mouth at bedtime.    [provider]  gabapentin  (NEURONTIN ) 600 MG tablet Take 600 mg by mouth at bedtime. 08/26/15   [provider]  levothyroxine  (SYNTHROID ) 150 MCG tablet Take 150 mcg by mouth daily before breakfast.    [provider]  Magnesium  400 MG TABS Take 400 mg by mouth at bedtime.    [provider]  Melatonin 10 MG CAPS Take 10 mg by mouth at bedtime.    [provider]  metFORMIN  (GLUCOPHAGE -XR)  500 MG 24 hr tablet Take 1,000 mg by mouth at bedtime.    [provider]  methylPREDNISolone  (MEDROL  DOSEPAK) 4 MG TBPK tablet Take by mouth daily, taper daily dose per package instructions. 06/04/24   Ulis Bottcher, PA-C  MILK THISTLE PO Take 1 tablet by mouth daily.    [provider]  Multiple Vitamin (MULTI-VITAMINS) TABS Take 1 tablet by mouth daily.     [provider]  nortriptyline  (PAMELOR ) 10 MG capsule Take 10-20 mg by mouth See admin instructions. Take 10 mg in the morning and 20 mg at night    [provider]  olmesartan -hydrochlorothiazide  (BENICAR  HCT) 20-12.5 MG tablet Take 1 tablet by mouth daily.    [provider]  RABEprazole (ACIPHEX) 20 MG  tablet Take 20 mg by mouth in the morning and at bedtime. 08/17/15   [provider]  rosuvastatin  (CRESTOR ) 40 MG tablet Take 40 mg by mouth at bedtime.    [provider]  tiZANidine  (ZANAFLEX ) 4 MG tablet Take 0.5-1 tablets (2-4 mg total) by mouth 3 (three) times daily as needed for muscle spasms. 02/12/24 02/11/25  Ulis Bottcher, PA-C  traZODone  (DESYREL ) 100 MG tablet Take 100 mg by mouth at bedtime.    [provider]  venlafaxine  XR (EFFEXOR -XR) 150 MG 24 hr capsule Take 150 mg by mouth at bedtime. 09/08/15   [provider]  vitamin B-12 (CYANOCOBALAMIN) 500 MCG tablet Take 500 mcg by mouth daily.    [provider]  Zinc 50 MG TABS Take 50 mg by mouth daily.    [provider]    Physical Exam: Vitals:   09/10/24 1530 09/10/24 2106  BP: (!) 143/69 (!) 146/77  Pulse: 95 89  Resp: 17   Temp: 98.4 F (36.9 C) 98.1 F (36.7 C)  TempSrc: Oral Oral  SpO2: 95% 100%  Weight: 98 kg   Height: 5' 6 (1.676 m)    Physical Exam Vitals and nursing note reviewed.  Constitutional:      General: She is not in acute distress.    Appearance: She is not ill-appearing or toxic-appearing.  HENT:     Mouth/Throat:     Mouth: Mucous membranes are moist.  Eyes:     General: No scleral icterus.    Extraocular Movements: Extraocular movements intact.     Pupils: Pupils are equal, round, and reactive to light.  Cardiovascular:     Rate and Rhythm: Normal rate and regular rhythm.  Pulmonary:     Effort: Pulmonary effort is normal.     Breath sounds: Normal breath sounds.  Abdominal:     General: Bowel sounds are normal. There is no distension.     Palpations: Abdomen is soft.     Tenderness: There is no abdominal tenderness.  Musculoskeletal:        General: Signs of injury present.     Cervical back: Normal range of motion and neck supple.     Comments: Right ankle in ace wrap, deferred exam. Toes NVI.   Skin:    General: Skin is warm  and dry.     Capillary Refill: Capillary refill takes less than 2 seconds.  Neurological:     General: No focal deficit present.     Mental Status: She is alert and oriented to person, place, and time. Mental status is at baseline.     Data Reviewed:   Labs on Admission: I have personally reviewed following labs and imaging studies  CBC: Recent  Labs  Lab 09/10/24 1713  WBC 9.3  HGB 13.8  HCT 40.4  MCV 92.9  PLT 223   Basic Metabolic Panel: Recent Labs  Lab 09/10/24 1713  NA 140  K 3.8  CL 101  CO2 29  GLUCOSE 116*  BUN 11  CREATININE 0.94  CALCIUM  9.9   GFR: Estimated Creatinine Clearance: 71.4 mL/min (by C-G formula based on SCr of 0.94 mg/dL). Liver Function Tests: Recent Labs  Lab 09/10/24 1713  AST 36  ALT 33  ALKPHOS 67  BILITOT 0.3  PROT 7.1  ALBUMIN  4.6   No results for input(s): LIPASE, AMYLASE in the last 168 hours. No results for input(s): AMMONIA in the last 168 hours. Coagulation Profile: No results for input(s): INR, PROTIME in the last 168 hours. Cardiac Enzymes: No results for input(s): CKTOTAL, CKMB, CKMBINDEX, TROPONINI in the last 168 hours. BNP (last 3 results) No results for input(s): PROBNP in the last 8760 hours. HbA1C: No results for input(s): HGBA1C in the last 72 hours. CBG: No results for input(s): GLUCAP in the last 168 hours. Lipid Profile: No results for input(s): CHOL, HDL, LDLCALC, TRIG, CHOLHDL, LDLDIRECT in the last 72 hours. Thyroid  Function Tests: No results for input(s): TSH, T4TOTAL, FREET4, T3FREE, THYROIDAB in the last 72 hours. Anemia Panel: No results for input(s): VITAMINB12, FOLATE, FERRITIN, TIBC, IRON, RETICCTPCT in the last 72 hours. Urine analysis:    Component Value Date/Time   COLORURINE YELLOW (A) 02/09/2024 1309   APPEARANCEUR HAZY (A) 02/09/2024 1309   APPEARANCEUR Clear 08/28/2013 1023   LABSPEC 1.013 02/09/2024 1309   LABSPEC 1.016  08/28/2013 1023   PHURINE 8.0 02/09/2024 1309   GLUCOSEU NEGATIVE 02/09/2024 1309   GLUCOSEU Negative 08/28/2013 1023   HGBUR NEGATIVE 02/09/2024 1309   BILIRUBINUR NEGATIVE 02/09/2024 1309   BILIRUBINUR Negative 08/28/2013 1023   KETONESUR NEGATIVE 02/09/2024 1309   PROTEINUR NEGATIVE 02/09/2024 1309   NITRITE NEGATIVE 02/09/2024 1309   LEUKOCYTESUR NEGATIVE 02/09/2024 1309   LEUKOCYTESUR Negative 08/28/2013 1023    Radiological Exams on Admission: CT Ankle Right Wo Contrast Result Date: 09/10/2024 EXAM: CT RIGHT ANKLE, WITHOUT IV CONTRAST 09/10/2024 05:44:15 PM TECHNIQUE: Axial images were acquired through the right ankle without IV contrast. Reformatted images were reviewed. Automated exposure control, iterative reconstruction, and/or weight based adjustment of the mA/kV was utilized to reduce the radiation dose to as low as reasonably achievable. COMPARISON: None provided. CLINICAL HISTORY: Ankle trauma, fracture, x-ray done. Age greater than or equal to 5 years. FINDINGS: BONES: Acute comminuted and displaced transyndesmotic distal fibular fracture. Acute displaced intra-articular fracture of the distal tibia involving the lateral aspect of the tibiotalar joint and syndesmosis (5.27). Acute displaced avulsion fracture of the medial malleolus. Likely tiny acute avulsion fracture of the posterior malleolus (6.41). Slight cortical step-off of the lateral aspect of the talar dome may represent a tiny nondisplaced fracture (5:30). Cortical irregularity of the talar head anterior superiorly with no definite fracture (3867). Likely old healed talar neck fracture (3666). No other acute displaced fracture of the partially visualized foot and remaining bones of the ankle. JOINTS: Slight subluxation of the tibiotalar joint in the setting of a lateral distal tibial fracture involving the tibial plafond. No widening of the medial clear space. No dislocation. SOFT TISSUES: Associated soft tissue edema. No  retained radiopaque foreign body. No subcutaneous soft tissue emphysema. IMPRESSION: 1. Acute displaced intra-articular fracture of the distal tibia involving the lateral aspect of the tibiotalar joint and syndesmosis. Associated slight subluxation of the  tibiotalar joint. 2. Acute comminuted and displaced transsyndesmotic distal fibular fracture. 3. Acute displaced avulsion fracture of the medial malleolus. 4. Likely tiny acute avulsion fracture of the posterior malleolus. 5. Slight cortical step-off of the lateral aspect of the talar dome, which may represent a tiny nondisplaced fracture. Electronically signed by: Morgane Naveau MD 09/10/2024 06:26 PM EST RP Workstation: [id number]   DG Ankle Complete Right Result Date: 09/10/2024 CLINICAL DATA:  Deformity post fall. EXAM: RIGHT ANKLE - COMPLETE 3+ VIEW COMPARISON:  None Available. FINDINGS: Mildly displaced spiral fracture involving the distal fibula. Fibular fracture is above the ankle joint. Fracture involving the distal tibia along the lateral aspect of the ankle joint. There is a displaced bone fragment at this fracture. Prominent spur involving the medial malleolus and difficult to exclude a subtle fracture in this area. Soft tissue swelling in the ankle. Ankle is located. Spurring along the plantar aspect of the calcaneus. IMPRESSION: 1. Mildly displaced fracture involving the distal fibula. 2. Intra-articular displaced fracture involving the distal tibia along the lateral aspect of the ankle joint. 3. Prominent spur involving the medial malleolus. Difficult to exclude a subtle fracture in this area. Electronically Signed   By: Juliene Balder M.D.   On: 09/10/2024 16:33       Assessment and Plan:  Right ankle fracture s/p mechanical fall  - pain control  - NWB status in ankle splint for now  - plan for OR tmorrow per Podiatry  - PT  - NPO midnight   T2DM  - SSI   Hypothyroidism  Chronic pain  Depression  HLD  - home medications resumed    Lovenox   NPO  midnight    Advance Care Planning:   Code Status: Full Code  Consults: Podiatry    Severity of Illness: The appropriate patient status for this patient is INPATIENT. Inpatient status is judged to be reasonable and necessary in order to provide the required intensity of service to ensure the patient's safety. The patient's presenting symptoms, physical exam findings, and initial radiographic and laboratory data in the context of their chronic comorbidities is felt to place them at high risk for further clinical deterioration. Furthermore, it is not anticipated that the patient will be medically stable for discharge from the hospital within 2 midnights of admission.   * I certify that at the point of admission it is my clinical judgment that the patient will require inpatient hospital care spanning beyond 2 midnights from the point of admission due to high intensity of service, high risk for further deterioration and high frequency of surveillance required.*  Author: Daved JAYSON Pump, DO 09/10/2024 9:33 PM  For on call review [email or web address].      [1]  Allergies Allergen Reactions   Liraglutide Other (See Comments)    History of thyroid  cancer   Macrobid [Nitrofurantoin] Other (See Comments)    Flu like symptoms   Morphine     Headaches    Wellbutrin [Bupropion] Other (See Comments)    Suicidal thoughts   "

## 2024-09-10 NOTE — ED Notes (Signed)
 Report off to cathy rn

## 2024-09-10 NOTE — ED Triage Notes (Signed)
 PT fell in driveway on ice. Pt has deformity to right ankle. Cap refill is less than 3 seconds.

## 2024-09-10 NOTE — Consult Note (Signed)
 PODIATRY / FOOT AND ANKLE SURGERY CONSULTATION NOTE  Requesting Physician: Dr. [name]  Reason for consult: R ankle injury  Chief Complaint: R ankle pain/injury   HPI: Sherri Dickerson is a 64 y.o. female who presents with right ankle pain after sustaining a fall when slipping on ice going to her mail box.  She went to the ED and was diagnosed with a bimal fracture.  She was placed into a compression dressing and posterior splint and was noted to be NV intact.  Podiatry was consulted for eval.  She notes some pain to the right ankle but has a high pain tolerance.  She notes a hx of chronic back issues requiring previous surgery and spine stimulator.  She denies n/v/f/c.  She does have a hx of smoking and diabetes.  She states her last A1c was 6.9%.  PMHx:  Past Medical History:  Diagnosis Date   Anxiety    Arthritis    Carpal tunnel syndrome, bilateral    Diabetes mellitus without complication (HCC)    Diabetic neuropathy (HCC)    Essential hypertension    Family history of adverse reaction to anesthesia    daughter woke up during colonoscopy   Gastritis    GERD (gastroesophageal reflux disease)    Hepatic steatosis    History of 2019 novel coronavirus disease (COVID-19) 08/23/2022   Hyperlipidemia    Hypothyroidism    Migraines    Osteoarthritis    Osteopenia    Personal history of radiation therapy    Sleep apnea    Status post radiation therapy    thyroid    Thyroid  cancer (HCC) 2008   Type 2 diabetes mellitus with peripheral neuropathy (HCC)     Surgical Hx:  Past Surgical History:  Procedure Laterality Date   ANTERIOR LUMBAR FUSION N/A 10/24/2022   Procedure: L2-3 LATERAL LUMBAR INTERBODY FUSION (NUVASIVE), L2-3 POSTERIOR COLUMN OSTEOTOMY;  Surgeon: Clois Fret, MD;  Location: [facility] ORS;  Service: Neurosurgery;  Laterality: N/A;   APPLICATION OF INTRAOPERATIVE CT SCAN N/A 10/24/2022   Procedure: APPLICATION OF INTRAOPERATIVE CT SCAN;  Surgeon: Clois Fret,  MD;  Location: [facility];  Service: Neurosurgery;  Laterality: N/A;   COLONOSCOPY  06/14/2011   Dr Luellen   COLONOSCOPY N/A 03/21/2022   Procedure: COLONOSCOPY;  Surgeon: Onita Elspeth Sharper, DO;  Location: [facility] ENDOSCOPY;  Service: Gastroenterology;  Laterality: N/A;   ESOPHAGOGASTRODUODENOSCOPY N/A 03/21/2022   Procedure: ESOPHAGOGASTRODUODENOSCOPY (EGD);  Surgeon: Onita Elspeth Sharper, DO;  Location: [facility]COPY;  Service: Gastroenterology;  Laterality: N/A;   ESOPHAGOGASTRODUODENOSCOPY  05/04/2009   FORAMINOTOMY 1 LEVEL N/A 10/24/2022   Procedure: LEFT L5-S1 FORAMINOTOMY;  Surgeon: Clois Fret, MD;  Location: [facility] ORS;  Service: Neurosurgery;  Laterality: N/A;   FORAMINOTOMY 2 LEVEL  07/25/2016   Procedure: FORAMINOTOMY 2 LEVEL;  Surgeon: Elspeth Ahle, MD;  Location: [facility];  Service: Neurosurgery;;  L4-5, L3-4   LUMBAR LAMINECTOMY/DECOMPRESSION MICRODISCECTOMY Left 07/25/2016   Procedure: LUMBAR LAMINECTOMY/DECOMPRESSION MICRODISCECTOMY 2 LEVELS;  Surgeon: Elspeth Ahle, MD;  Location: [facility] ORS;  Service: Neurosurgery;  Laterality: Left;  L3-4 hemilaminectomy and  L4-5 full laminectomy   NASAL SINUS SURGERY     x 2   OOPHORECTOMY     POSTERIOR LUMBAR FUSION 4 LEVEL N/A 10/24/2022   Procedure: L2-S2 POSTERIOR SPINAL FUSION;  Surgeon: Clois Fret, MD;  Location: [facility] ORS;  Service: Neurosurgery;  Laterality: N/A;   THORACIC LAMINECTOMY FOR SPINAL CORD STIMULATOR N/A 01/26/2024   Procedure: THORACIC LAMINECTOMY FOR SPINAL CORD STIMULATOR;  Surgeon: Clois,  Reeves, MD;  Location: [facility] ORS;  Service: Neurosurgery;  Laterality: N/A;  THORACIC LAMINECTOMY FOR SPINAL CORD STIMULATOR   THYROIDECTOMY, COMPLETION  08/13/2007   THYROIDECTOMY, PARTIAL Left 07/09/2007   Dr Edda   TONSILLECTOMY AND ADENOIDECTOMY     TOTAL ABDOMINAL HYSTERECTOMY W/ BILATERAL SALPINGOOPHORECTOMY  2005   TRANSFORAMINAL LUMBAR INTERBODY FUSION (TLIF) WITH PEDICLE SCREW FIXATION 3 LEVEL Bilateral  04/24/2017   Procedure: TRANSFORAMINAL LUMBAR INTERBODY FUSION (TLIF) WITH PEDICLE SCREW FIXATION 3 LEVEL-L3-S1;  Surgeon: Bluford Standing, MD;  Location: [facility] ORS;  Service: Neurosurgery;  Laterality: Bilateral;   TUBAL LIGATION  1995   TYMPANOSTOMY TUBE PLACEMENT  1965   URETHRAL DILATION  1967    FHx:  Family History  Problem Relation Age of Onset   Diabetes Mother    Dementia Father    Breast cancer Neg Hx     Social History:  reports that she has been smoking cigarettes. She started smoking about 2 years ago. She has a 1 pack-year smoking history. She has never used smokeless tobacco. She reports that she does not currently use alcohol. She reports that she does not use drugs.  Allergies: Allergies[1]  Medications Prior to Admission  Medication Sig Dispense Refill   acetaminophen  (TYLENOL ) 500 MG tablet Take 1,500 mg by mouth daily as needed for moderate pain (pain score 4-6).     Ascorbic Acid (VITAMIN C) 1000 MG tablet Take 1,000 mg by mouth daily.     Calcium  Carb-Cholecalciferol (CALCIUM  600+D) 600-20 MG-MCG TABS Take 1 tablet by mouth daily.     celecoxib  (CELEBREX ) 100 MG capsule Take 100 mg by mouth 2 (two) times daily.     cetirizine (ZYRTEC) 10 MG tablet Take 10 mg by mouth daily.     Cholecalciferol (VITAMIN D3 MAXIMUM STRENGTH) 125 MCG (5000 UT) capsule Take 5,000 Units by mouth daily.     citalopram  (CELEXA ) 20 MG tablet Take 20 mg by mouth at bedtime.     fenofibrate  (TRICOR ) 48 MG tablet Take 48 mg by mouth at bedtime.     levothyroxine  (SYNTHROID ) 150 MCG tablet Take 150 mcg by mouth daily before breakfast.     Magnesium  400 MG TABS Take 400 mg by mouth at bedtime.     Melatonin 10 MG CAPS Take 10 mg by mouth at bedtime.     metFORMIN  (GLUCOPHAGE -XR) 500 MG 24 hr tablet Take 1,000 mg by mouth at bedtime.     methocarbamol  (ROBAXIN ) 500 MG tablet Take 500 mg by mouth at bedtime.     MILK THISTLE PO Take 1 tablet by mouth daily.     MOUNJARO 5 MG/0.5ML Pen Inject 5  mg into the skin once a week.     Multiple Vitamin (MULTI-VITAMINS) TABS Take 1 tablet by mouth daily.      nortriptyline  (PAMELOR ) 10 MG capsule Take 10-20 mg by mouth See admin instructions. Take 10 mg in the morning and 20 mg at night     olmesartan -hydrochlorothiazide  (BENICAR  HCT) 20-12.5 MG tablet Take 1 tablet by mouth daily.     RABEprazole (ACIPHEX) 20 MG tablet Take 20 mg by mouth in the morning and at bedtime.  0   rosuvastatin  (CRESTOR ) 40 MG tablet Take 40 mg by mouth at bedtime.     tiZANidine  (ZANAFLEX ) 4 MG tablet Take 0.5-1 tablets (2-4 mg total) by mouth 3 (three) times daily as needed for muscle spasms. 90 tablet 1   traZODone  (DESYREL ) 100 MG tablet Take 100 mg by mouth at bedtime.  venlafaxine  XR (EFFEXOR -XR) 150 MG 24 hr capsule Take 150 mg by mouth at bedtime.  11   vitamin B-12 (CYANOCOBALAMIN) 500 MCG tablet Take 500 mcg by mouth daily.     Zinc 50 MG TABS Take 50 mg by mouth daily.     benzonatate (TESSALON) 200 MG capsule Take 200 mg by mouth 3 (three) times daily as needed for cough. (Patient not taking: Reported on 09/10/2024)     gabapentin  (NEURONTIN ) 600 MG tablet Take 600 mg by mouth at bedtime. (Patient not taking: Reported on 09/10/2024)  11   methylPREDNISolone  (MEDROL  DOSEPAK) 4 MG TBPK tablet Take by mouth daily, taper daily dose per package instructions. (Patient not taking: Reported on 09/10/2024) 21 tablet 0    Physical Exam: General: Alert and oriented.  No apparent distress.  Splint in placed to the RLE.  CFT intact, able to palpate DP but unable to feel PT due to splint, DP/PT palpable L.  No hair growth to digits.  Able to DF and PF digits, light touch intact.  Results for orders placed or performed during the hospital encounter of 09/10/24 (from the past 48 hours)  Comprehensive metabolic panel     Status: Abnormal   Collection Time: 09/10/24  5:13 PM  Result Value Ref Range   Sodium 140 135 - 145 mmol/L   Potassium 3.8 3.5 - 5.1 mmol/L    Chloride 101 98 - 111 mmol/L   CO2 29 22 - 32 mmol/L   Glucose, Bld 116 (H) 70 - 99 mg/dL    Comment: Glucose reference range applies only to samples taken after fasting for at least 8 hours.   BUN 11 8 - 23 mg/dL   Creatinine, Ser 9.05 0.44 - 1.00 mg/dL   Calcium  9.9 8.9 - 10.3 mg/dL   Total Protein 7.1 6.5 - 8.1 g/dL   Albumin  4.6 3.5 - 5.0 g/dL   AST 36 15 - 41 U/L   ALT 33 0 - 44 U/L   Alkaline Phosphatase 67 38 - 126 U/L   Total Bilirubin 0.3 0.0 - 1.2 mg/dL   GFR, Estimated >39 >39 mL/min    Comment: (NOTE) Calculated using the CKD-EPI Creatinine Equation (2021)    Anion gap 11 5 - 15    Comment: Performed at [facility], [address] [place], [place] [address]  CBC     Status: None   Collection Time: 09/10/24  5:13 PM  Result Value Ref Range   WBC 9.3 4.0 - 10.5 K/uL   RBC 4.35 3.87 - 5.11 MIL/uL   Hemoglobin 13.8 12.0 - 15.0 g/dL   HCT 59.5 63.9 - 53.9 %   MCV 92.9 80.0 - 100.0 fL   MCH 31.7 26.0 - 34.0 pg   MCHC 34.2 30.0 - 36.0 g/dL   RDW 87.0 88.4 - 84.4 %   Platelets 223 150 - 400 K/uL   nRBC 0.0 0.0 - 0.2 %    Comment: Performed at [facility], [address], [place], [place] [address]   CT Ankle Right Wo Contrast Result Date: 09/10/2024 EXAM: CT RIGHT ANKLE, WITHOUT IV CONTRAST 09/10/2024 05:44:15 PM TECHNIQUE: Axial images were acquired through the right ankle without IV contrast. Reformatted images were reviewed. Automated exposure control, iterative reconstruction, and/or weight based adjustment of the mA/kV was utilized to reduce the radiation dose to as low as reasonably achievable. COMPARISON: None provided. CLINICAL HISTORY: Ankle trauma, fracture, x-ray done. Age greater than or equal to 5 years. FINDINGS: BONES:  Acute comminuted and displaced transyndesmotic distal fibular fracture. Acute displaced intra-articular fracture of the distal tibia involving the lateral aspect of the tibiotalar joint and syndesmosis (5.27). Acute displaced  avulsion fracture of the medial malleolus. Likely tiny acute avulsion fracture of the posterior malleolus (6.41). Slight cortical step-off of the lateral aspect of the talar dome may represent a tiny nondisplaced fracture (5:30). Cortical irregularity of the talar head anterior superiorly with no definite fracture (3867). Likely old healed talar neck fracture (3666). No other acute displaced fracture of the partially visualized foot and remaining bones of the ankle. JOINTS: Slight subluxation of the tibiotalar joint in the setting of a lateral distal tibial fracture involving the tibial plafond. No widening of the medial clear space. No dislocation. SOFT TISSUES: Associated soft tissue edema. No retained radiopaque foreign body. No subcutaneous soft tissue emphysema. IMPRESSION: 1. Acute displaced intra-articular fracture of the distal tibia involving the lateral aspect of the tibiotalar joint and syndesmosis. Associated slight subluxation of the tibiotalar joint. 2. Acute comminuted and displaced transsyndesmotic distal fibular fracture. 3. Acute displaced avulsion fracture of the medial malleolus. 4. Likely tiny acute avulsion fracture of the posterior malleolus. 5. Slight cortical step-off of the lateral aspect of the talar dome, which may represent a tiny nondisplaced fracture. Electronically signed by: Morgane Naveau MD 09/10/2024 06:26 PM EST RP Workstation: [id number]   DG Ankle Complete Right Result Date: 09/10/2024 CLINICAL DATA:  Deformity post fall. EXAM: RIGHT ANKLE - COMPLETE 3+ VIEW COMPARISON:  None Available. FINDINGS: Mildly displaced spiral fracture involving the distal fibula. Fibular fracture is above the ankle joint. Fracture involving the distal tibia along the lateral aspect of the ankle joint. There is a displaced bone fragment at this fracture. Prominent spur involving the medial malleolus and difficult to exclude a subtle fracture in this area. Soft tissue swelling in the ankle. Ankle is  located. Spurring along the plantar aspect of the calcaneus. IMPRESSION: 1. Mildly displaced fracture involving the distal fibula. 2. Intra-articular displaced fracture involving the distal tibia along the lateral aspect of the ankle joint. 3. Prominent spur involving the medial malleolus. Difficult to exclude a subtle fracture in this area. Electronically Signed   By: Juliene Balder M.D.   On: 09/10/2024 16:33    Blood pressure 129/83, pulse 93, temperature 97.9 F (36.6 C), resp. rate 19, height 5' 6 (1.676 m), weight 98 kg, SpO2 99%.  Assessment Right ankle trimal mildly displaced fracture Tillaux fracture right distal tibia anterolateral displaced Right ankle syndesmotic dystruption and possible deltoid ligament disruption with associated avulsion fracture Hx of smoking  Plan -Patient seen and examined -X-ray and CT reviewed.   -All treatment options were discussed with the patient of both conservative and surgical attempts at correction including potential risks and complications.  Patient has elected for procedure consisting of Right trimal ankle fracture ORIF with possible syndesmotic and deltoid ligament repair.  No guarantees given.  Consent obtained. -Discussed complications including nerve damage, post-traumatic arthritis, infection, wound healing problems, smoking puts her at increased risk.  Highly recommend smoking cessation immediately and during postop course -Discussed postop course in detail. -Patient to be NPO at midnight tonight for surgery tomorrow.  Plan for around 230pm.   -NWB RLE, will order PT/OT after surgery tomorrow. -Keep splint CDI with compression and keep elevated.   Prentice Lee, DPM 09/10/2024, 9:50 PM         [1]  Allergies Allergen Reactions   Liraglutide Other (See Comments)    History of thyroid   cancer   Macrobid [Nitrofurantoin] Other (See Comments)    Flu like symptoms   Morphine     Headaches    Wellbutrin [Bupropion] Other (See Comments)     Suicidal thoughts

## 2024-09-10 NOTE — Progress Notes (Signed)
 Anticoagulation monitoring(Lovenox ):  64 yo female ordered Lovenox  40 mg Q24h    Filed Weights   09/10/24 1530  Weight: 98 kg (216 lb)   BMI 34.9   Lab Results  Component Value Date   CREATININE 0.94 09/10/2024   CREATININE 0.72 02/09/2024   CREATININE 0.88 10/12/2022   Estimated Creatinine Clearance: 71.4 mL/min (by C-G formula based on SCr of 0.94 mg/dL). Hemoglobin & Hematocrit     Component Value Date/Time   HGB 13.8 09/10/2024 1713   HGB 12.7 08/28/2013 1023   HCT 40.4 09/10/2024 1713   HCT 37.4 08/28/2013 1023     Per Protocol for Patient with estCrcl > 30 ml/min and BMI > 30, will transition to Lovenox  50 mg Q24h.

## 2024-09-11 ENCOUNTER — Encounter
Admission: EM | Disposition: A | Discharge status: Home / Self Care | Payer: Self-pay | Source: Home / Self Care | Attending: Emergency Medicine

## 2024-09-11 ENCOUNTER — Inpatient Hospital Stay: Admitting: Anesthesiology

## 2024-09-11 ENCOUNTER — Encounter: Payer: Self-pay | Admitting: Emergency Medicine

## 2024-09-11 ENCOUNTER — Inpatient Hospital Stay

## 2024-09-11 ENCOUNTER — Observation Stay

## 2024-09-11 ENCOUNTER — Other Ambulatory Visit: Payer: Self-pay

## 2024-09-11 DIAGNOSIS — S82891A Other fracture of right lower leg, initial encounter for closed fracture: Secondary | ICD-10-CM | POA: Diagnosis not present

## 2024-09-11 LAB — GLUCOSE, CAPILLARY
Glucose-Capillary: 115 mg/dL — ABNORMAL HIGH (ref 70–99)
Glucose-Capillary: 100 mg/dL — ABNORMAL HIGH (ref 70–99)
Glucose-Capillary: 99 mg/dL (ref 70–99)
Glucose-Capillary: 157 mg/dL — ABNORMAL HIGH (ref 70–99)

## 2024-09-11 LAB — SURGICAL PCR SCREEN
MRSA, PCR: NEGATIVE
Staphylococcus aureus: POSITIVE — AB

## 2024-09-11 LAB — HEMOGLOBIN A1C
Hgb A1c MFr Bld: 6 % — ABNORMAL HIGH (ref 4.8–5.6)
Mean Plasma Glucose: 125.5 mg/dL

## 2024-09-11 MED ORDER — FENTANYL CITRATE (PF) 100 MCG/2ML IJ SOLN
INTRAMUSCULAR | Status: AC
  Filled 2024-09-11: qty 2

## 2024-09-11 MED ORDER — LIDOCAINE HCL (PF) 2 % IJ SOLN
INTRAMUSCULAR | Status: AC
  Filled 2024-09-11: qty 5

## 2024-09-11 MED ORDER — LACTATED RINGERS IV SOLN: INTRAVENOUS | Status: DC

## 2024-09-11 MED ORDER — CEFAZOLIN SODIUM-DEXTROSE 2-4 GM/100ML-% IV SOLN
INTRAVENOUS | Status: AC
  Filled 2024-09-11: qty 100

## 2024-09-11 MED ORDER — SUCCINYLCHOLINE CHLORIDE 200 MG/10ML IV SOSY
PREFILLED_SYRINGE | INTRAVENOUS | Status: DC | PRN
  Administered 2024-09-11: 100 mg via INTRAVENOUS

## 2024-09-11 MED ORDER — ACETAMINOPHEN 10 MG/ML IV SOLN
INTRAVENOUS | Status: AC
  Filled 2024-09-11: qty 100

## 2024-09-11 MED ORDER — SODIUM CHLORIDE 0.9 % IV SOLN: INTRAVENOUS | Status: DC | PRN

## 2024-09-11 MED ORDER — ONDANSETRON HCL 4 MG/2ML IJ SOLN
INTRAMUSCULAR | Status: AC
  Filled 2024-09-11: qty 2

## 2024-09-11 MED ORDER — BUPIVACAINE LIPOSOME 1.3 % IJ SUSP
INTRAMUSCULAR | Status: AC
  Filled 2024-09-11: qty 20

## 2024-09-11 MED ORDER — 0.9 % SODIUM CHLORIDE (POUR BTL) OPTIME
TOPICAL | Status: DC | PRN
  Administered 2024-09-11: 500 mL

## 2024-09-11 MED ORDER — MUPIROCIN 2 % EX OINT
1.0000 | TOPICAL_OINTMENT | Freq: Two times a day (BID) | CUTANEOUS | Status: DC
  Administered 2024-09-11 – 2024-09-12 (×3): 1 via NASAL
  Filled 2024-09-11: qty 22

## 2024-09-11 MED ORDER — MIDAZOLAM HCL (PF) 2 MG/2ML IJ SOLN
1.0000 mg | Freq: Once | INTRAMUSCULAR | Status: AC
  Administered 2024-09-11: 1 mg via INTRAVENOUS

## 2024-09-11 MED ORDER — PHENYLEPHRINE 80 MCG/ML (10ML) SYRINGE FOR IV PUSH (FOR BLOOD PRESSURE SUPPORT)
PREFILLED_SYRINGE | INTRAVENOUS | Status: DC | PRN
  Administered 2024-09-11 (×2): 160 ug via INTRAVENOUS
  Administered 2024-09-11: 80 ug via INTRAVENOUS
  Administered 2024-09-11: 160 ug via INTRAVENOUS

## 2024-09-11 MED ORDER — ACETAMINOPHEN 10 MG/ML IV SOLN: 1000.0000 mg | Freq: Once | INTRAVENOUS | Status: DC | PRN

## 2024-09-11 MED ORDER — DEXAMETHASONE SOD PHOSPHATE PF 10 MG/ML IJ SOLN
INTRAMUSCULAR | Status: DC | PRN
  Administered 2024-09-11: 5 mg via INTRAVENOUS

## 2024-09-11 MED ORDER — PHENYLEPHRINE HCL-NACL 20-0.9 MG/250ML-% IV SOLN
INTRAVENOUS | Status: AC
  Filled 2024-09-11: qty 250

## 2024-09-11 MED ORDER — MIDAZOLAM HCL 2 MG/2ML IJ SOLN
INTRAMUSCULAR | Status: AC
  Filled 2024-09-11: qty 2

## 2024-09-11 MED ORDER — SUCCINYLCHOLINE CHLORIDE 200 MG/10ML IV SOSY
PREFILLED_SYRINGE | INTRAVENOUS | Status: AC
  Filled 2024-09-11: qty 10

## 2024-09-11 MED ORDER — BUPIVACAINE HCL (PF) 0.5 % IJ SOLN
INTRAMUSCULAR | Status: DC | PRN
  Administered 2024-09-11 (×2): 10 mL via PERINEURAL

## 2024-09-11 MED ORDER — BUPIVACAINE LIPOSOME 1.3 % IJ SUSP
INTRAMUSCULAR | Status: DC | PRN
  Administered 2024-09-11 (×2): 10 mL via PERINEURAL

## 2024-09-11 MED ORDER — MIDAZOLAM HCL (PF) 2 MG/2ML IJ SOLN
INTRAMUSCULAR | Status: DC | PRN
  Administered 2024-09-11: 2 mg via INTRAVENOUS

## 2024-09-11 MED ORDER — ACETAMINOPHEN 10 MG/ML IV SOLN
INTRAVENOUS | Status: DC | PRN
  Administered 2024-09-11: 1000 mg via INTRAVENOUS

## 2024-09-11 MED ORDER — LIDOCAINE HCL (PF) 1 % IJ SOLN
INTRAMUSCULAR | Status: DC | PRN
  Administered 2024-09-11 (×2): 2.5 mL via SUBCUTANEOUS

## 2024-09-11 MED ORDER — FENTANYL CITRATE (PF) 100 MCG/2ML IJ SOLN
INTRAMUSCULAR | Status: DC | PRN
  Administered 2024-09-11 (×4): 50 ug via INTRAVENOUS

## 2024-09-11 MED ORDER — LIDOCAINE HCL (PF) 1 % IJ SOLN
INTRAMUSCULAR | Status: AC
  Filled 2024-09-11: qty 5

## 2024-09-11 MED ORDER — ONDANSETRON HCL 4 MG/2ML IJ SOLN
INTRAMUSCULAR | Status: DC | PRN
  Administered 2024-09-11: 4 mg via INTRAVENOUS

## 2024-09-11 MED ORDER — DEXMEDETOMIDINE HCL IN NACL 80 MCG/20ML IV SOLN
INTRAVENOUS | Status: AC
  Filled 2024-09-11: qty 20

## 2024-09-11 MED ORDER — CHLORHEXIDINE GLUCONATE CLOTH 2 % EX PADS
6.0000 | MEDICATED_PAD | Freq: Every day | CUTANEOUS | Status: DC
  Administered 2024-09-11: 6 via TOPICAL

## 2024-09-11 MED ORDER — PHENYLEPHRINE 80 MCG/ML (10ML) SYRINGE FOR IV PUSH (FOR BLOOD PRESSURE SUPPORT)
PREFILLED_SYRINGE | INTRAVENOUS | Status: AC
  Filled 2024-09-11: qty 10

## 2024-09-11 MED ORDER — DEXAMETHASONE SOD PHOSPHATE PF 10 MG/ML IJ SOLN
INTRAMUSCULAR | Status: AC
  Filled 2024-09-11: qty 1

## 2024-09-11 MED ORDER — LIDOCAINE HCL (CARDIAC) PF 100 MG/5ML IV SOSY
PREFILLED_SYRINGE | INTRAVENOUS | Status: DC | PRN
  Administered 2024-09-11: 80 mg via INTRAVENOUS

## 2024-09-11 MED ORDER — PHENYLEPHRINE HCL-NACL 20-0.9 MG/250ML-% IV SOLN
INTRAVENOUS | Status: DC | PRN
  Administered 2024-09-11: 30 ug/min via INTRAVENOUS

## 2024-09-11 MED ORDER — OXYCODONE HCL 5 MG PO TABS: 5.0000 mg | ORAL_TABLET | ORAL | Status: DC | PRN

## 2024-09-11 MED ORDER — BUPIVACAINE HCL (PF) 0.5 % IJ SOLN
INTRAMUSCULAR | Status: AC
  Filled 2024-09-11: qty 20

## 2024-09-11 MED ORDER — DEXMEDETOMIDINE HCL IN NACL 80 MCG/20ML IV SOLN
INTRAVENOUS | Status: DC | PRN
  Administered 2024-09-11: 4 ug via INTRAVENOUS
  Administered 2024-09-11: 8 ug via INTRAVENOUS

## 2024-09-11 MED ORDER — FENTANYL CITRATE (PF) 50 MCG/ML IJ SOSY
50.0000 ug | PREFILLED_SYRINGE | Freq: Once | INTRAMUSCULAR | Status: AC
  Administered 2024-09-11: 50 ug via INTRAVENOUS

## 2024-09-11 MED ORDER — BUPIVACAINE HCL (PF) 0.5 % IJ SOLN: INTRAMUSCULAR | Status: DC | PRN

## 2024-09-11 MED ORDER — FENTANYL CITRATE (PF) 100 MCG/2ML IJ SOLN: 25.0000 ug | INTRAMUSCULAR | Status: DC | PRN

## 2024-09-11 MED ORDER — FENTANYL CITRATE (PF) 50 MCG/ML IJ SOSY
PREFILLED_SYRINGE | INTRAMUSCULAR | Status: AC
  Filled 2024-09-11: qty 1

## 2024-09-11 MED ORDER — PROPOFOL 10 MG/ML IV BOLUS
INTRAVENOUS | Status: DC | PRN
  Administered 2024-09-11: 150 mg via INTRAVENOUS

## 2024-09-11 NOTE — Plan of Care (Signed)
  Problem: Activity: Goal: Risk for activity intolerance will decrease Outcome: Progressing   Problem: Nutrition: Goal: Adequate nutrition will be maintained Outcome: Progressing   Problem: Elimination: Goal: Will not experience complications related to bowel motility Outcome: Progressing   Problem: Pain Managment: Goal: General experience of comfort will improve and/or be controlled Outcome: Progressing   Problem: Skin Integrity: Goal: Risk for impaired skin integrity will decrease Outcome: Progressing

## 2024-09-11 NOTE — Transfer of Care (Signed)
 Immediate Anesthesia Transfer of Care Note  Patient: Sherri Dickerson  Procedure(s) Performed: OPEN REDUCTION INTERNAL FIXATION (ORIF) ANKLE FRACTURE (Right: Ankle)  Patient Location: PACU  Anesthesia Type:General  Level of Consciousness: drowsy and patient cooperative  Airway & Oxygen Therapy: Patient Spontanous Breathing and Patient connected to face mask oxygen  Post-op Assessment: Report given to RN and Post -op Vital signs reviewed and stable  Post vital signs: Reviewed and stable  Last Vitals:  Vitals Value Taken Time  BP 102/58 09/11/24 17:36  Temp    Pulse 78 09/11/24 17:39  Resp 14 09/11/24 17:39  SpO2 96 % 09/11/24 17:39  Vitals shown include unfiled device data.  Last Pain:  Vitals:   09/11/24 1415  TempSrc:   PainSc: 0-No pain      Patients Stated Pain Goal: 0 (09/11/24 1347)  Complications: No notable events documented.

## 2024-09-11 NOTE — Anesthesia Procedure Notes (Signed)
 Anesthesia Regional Block: Other ([place])   Pre-Anesthetic Checklist: , timeout performed,  Correct Patient, Correct Site, Correct Laterality,  Correct Procedure, Correct Position, site marked,  Risks and benefits discussed,  Surgical consent,  Pre-op evaluation,  At surgeon's request and post-op pain management  Laterality: Lower and Right  Prep: chloraprep       Needles:  Injection technique: Single-shot  Needle Type: Echogenic Needle     Needle Length: 9cm  Needle Gauge: 21     Additional Needles:   Procedures:,,,, ultrasound used (permanent image in chart),,    Narrative:  Start time: 09/11/2024 2:27 PM End time: 09/11/2024 2:29 PM Injection made incrementally with aspirations every 5 mL.  Performed by: Personally  Anesthesiologist: Dario Barter, MD  Additional Notes: Patient consented for risk and benefits of nerve block including but not limited to nerve damage, failed block, bleeding and infection.  Patient voiced understanding.  Functioning IV was confirmed and monitors were applied.  Timeout done prior to procedure and prior to any sedation being given to the patient.  Patient confirmed procedure site prior to any sedation given to the patient.  A 50mm 22ga Stimuplex needle was used. Sterile prep,hand hygiene and sterile gloves were used.  Minimal sedation used for procedure.  No paresthesia endorsed by patient during the procedure.  Negative aspiration and negative test dose prior to incremental administration of local anesthetic. The patient tolerated the procedure well with no immediate complications.

## 2024-09-11 NOTE — Anesthesia Procedure Notes (Signed)
 Procedure Name: Intubation Date/Time: 09/11/2024 3:02 PM  Performed by: Lacretia Camelia NOVAK, CRNAPre-anesthesia Checklist: Patient identified, Emergency Drugs available, Suction available and Patient being monitored Patient Re-evaluated:Patient Re-evaluated prior to induction Oxygen Delivery Method: Circle system utilized Preoxygenation: Pre-oxygenation with 100% oxygen Induction Type: IV induction and Rapid sequence Laryngoscope Size: McGrath and 3 Grade View: Grade I Tube type: Oral Tube size: 7.0 mm Number of attempts: 1 Airway Equipment and Method: Stylet and Video-laryngoscopy Placement Confirmation: ETT inserted through vocal cords under direct vision, positive ETCO2 and breath sounds checked- equal and bilateral Secured at: 21 cm Tube secured with: Tape Dental Injury: Teeth and Oropharynx as per pre-operative assessment

## 2024-09-11 NOTE — Anesthesia Preprocedure Evaluation (Addendum)
 "                                  Anesthesia Evaluation  Patient identified by MRN, date of birth, ID band Patient awake    Reviewed: Allergy & Precautions, H&P , NPO status , Patient's Chart, lab work & pertinent test results, reviewed documented beta blocker date and time   History of Anesthesia Complications Negative for: history of anesthetic complications  Airway Mallampati: III  TM Distance: >3 FB Neck ROM: full    Dental  (+) Dental Advidsory Given, Caps, Poor Dentition, Missing   Pulmonary neg shortness of breath, sleep apnea and Continuous Positive Airway Pressure Ventilation , neg COPD, neg recent URI, Current Smoker and Patient abstained from smoking.   Pulmonary exam normal        Cardiovascular Exercise Tolerance: Poor hypertension, On Medications (-) angina (-) Past MI and (-) Cardiac Stents Normal cardiovascular exam(-) dysrhythmias (-) Valvular Problems/Murmurs Rhythm:regular Rate:Normal  ECG 01/16/24: normal   Neuro/Psych  Headaches, neg Seizures PSYCHIATRIC DISORDERS Anxiety     Chronic pain s/p spinal cord stimulator  Neuromuscular disease (neuropathy)    GI/Hepatic Neg liver ROS,GERD  Medicated,,NAFLD   Endo/Other  diabetes, Type 2Hypothyroidism (thyroid  CA s/p thyroidectomy)  Obesity   Renal/GU negative Renal ROS     Musculoskeletal  (+) Arthritis ,    Abdominal   Peds  Hematology negative hematology ROS (+)   Anesthesia Other Findings Past Medical History: No date: Anxiety No date: Arthritis No date: Carpal tunnel syndrome, bilateral No date: Diabetes mellitus without complication (HCC) No date: Diabetic neuropathy (HCC) No date: Family history of adverse reaction to anesthesia     Comment:  daughter woke up during colonoscopy No date: Gastritis No date: GERD (gastroesophageal reflux disease) 08/23/2022: History of 2019 novel coronavirus disease (COVID-19) No date: Hyperlipidemia No date: Hypertension No date:  Hypothyroidism No date: Migraines No date: Personal history of radiation therapy No date: Sleep apnea No date: Status post radiation therapy     Comment:  thyroid  2012?: Thyroid  cancer (HCC) No date: Type 2 diabetes mellitus with peripheral neuropathy (HCC) Past Surgical History: 2005: ABDOMINAL HYSTERECTOMY 2013?: COLONOSCOPY     Comment:  Dr Luellen 03/21/2022: COLONOSCOPY; N/A     Comment:  Procedure: COLONOSCOPY;  Surgeon: Onita Elspeth Sharper,              DO;  Location: [facility] ENDOSCOPY;  Service:               Gastroenterology;  Laterality: N/A; 03/21/2022: ESOPHAGOGASTRODUODENOSCOPY; N/A     Comment:  Procedure: ESOPHAGOGASTRODUODENOSCOPY (EGD);  Surgeon:               Onita Elspeth Sharper, DO;  Location: [facility] ENDOSCOPY;                Service: Gastroenterology;  Laterality: N/A; 07/25/2016: FORAMINOTOMY 2 LEVEL     Comment:  Procedure: FORAMINOTOMY 2 LEVEL;  Surgeon: Elspeth Ahle,               MD;  Location: [facility] ORS;  Service: Neurosurgery;;  L4-5,               L3-4 07/25/2016: LUMBAR LAMINECTOMY/DECOMPRESSION MICRODISCECTOMY; Left     Comment:  Procedure: LUMBAR LAMINECTOMY/DECOMPRESSION               MICRODISCECTOMY 2 LEVELS;  Surgeon: Elspeth Ahle, MD;  Location: [facility] ORS;  Service: Neurosurgery;  Laterality:               Left;  L3-4 hemilaminectomy and  L4-5 full laminectomy No date: NASAL SINUS SURGERY     Comment:  x 2 No date: OOPHORECTOMY 2012?: THYROIDECTOMY     Comment:  Dr Edda No date: TONSILLECTOMY 04/24/2017: TRANSFORAMINAL LUMBAR INTERBODY FUSION (TLIF) WITH  PEDICLE SCREW FIXATION 3 LEVEL; Bilateral     Comment:  Procedure: TRANSFORAMINAL LUMBAR INTERBODY FUSION (TLIF)              WITH PEDICLE SCREW FIXATION 3 LEVEL-L3-S1;  Surgeon:               Bluford Standing, MD;  Location: [facility] ORS;  Service:               Neurosurgery;  Laterality: Bilateral; No date: tubes in ears     Comment:  when she was 64 years old BMI    Body Mass  Index: 36.32 kg/m     Reproductive/Obstetrics negative OB ROS                              Anesthesia Physical Anesthesia Plan  ASA: 3  Anesthesia Plan: General and Regional   Post-op Pain Management: Regional block*   Induction: Intravenous  PONV Risk Score and Plan: 3 and Ondansetron , Dexamethasone  and Midazolam   Airway Management Planned: Oral ETT and LMA  Additional Equipment:   Intra-op Plan:   Post-operative Plan: Extubation in OR  Informed Consent: I have reviewed the patients History and Physical, chart, labs and discussed the procedure including the risks, benefits and alternatives for the proposed anesthesia with the patient or authorized representative who has indicated his/her understanding and acceptance.     Dental Advisory Given  Plan Discussed with: Anesthesiologist, CRNA and Surgeon  Anesthesia Plan Comments: (Patient consented for risks of anesthesia including but not limited to:  - adverse reactions to medications - damage to eyes, teeth, lips or other oral mucosa - nerve damage due to positioning  - sore throat or hoarseness - Damage to heart, brain, nerves, lungs, other parts of body or loss of life  Patient voiced understanding and assent.)        Anesthesia Quick Evaluation  "

## 2024-09-11 NOTE — Anesthesia Procedure Notes (Signed)
 Anesthesia Regional Block: Popliteal block   Pre-Anesthetic Checklist: , timeout performed,  Correct Patient, Correct Site, Correct Laterality,  Correct Procedure, Correct Position, site marked,  Risks and benefits discussed,  Surgical consent,  Pre-op evaluation,  At surgeon's request and post-op pain management  Laterality: Lower and Right  Prep: chloraprep       Needles:  Injection technique: Single-shot  Needle Type: Echogenic Needle     Needle Length: 9cm  Needle Gauge: 21     Additional Needles:   Procedures:,,,, ultrasound used (permanent image in chart),,    Narrative:  Start time: 09/11/2024 2:21 PM End time: 09/11/2024 2:23 PM Injection made incrementally with aspirations every 5 mL.  Performed by: Personally  Anesthesiologist: Dario Barter, MD  Additional Notes: Patient consented for risk and benefits of nerve block including but not limited to nerve damage, failed block, bleeding and infection.  Patient voiced understanding.  Functioning IV was confirmed and monitors were applied.  Timeout done prior to procedure and prior to any sedation being given to the patient.  Patient confirmed procedure site prior to any sedation given to the patient.  A 50mm 22ga Stimuplex needle was used. Sterile prep,hand hygiene and sterile gloves were used.  Minimal sedation used for procedure.  No paresthesia endorsed by patient during the procedure.  Negative aspiration and negative test dose prior to incremental administration of local anesthetic. The patient tolerated the procedure well with no immediate complications.

## 2024-09-11 NOTE — Anesthesia Postprocedure Evaluation (Signed)
"   Anesthesia Post Note  Patient: Sherri Dickerson  Procedure(s) Performed: OPEN REDUCTION INTERNAL FIXATION (ORIF) ANKLE FRACTURE (Right: Ankle)  Patient location during evaluation: PACU Anesthesia Type: Regional Level of consciousness: awake and alert Pain management: pain level controlled Vital Signs Assessment: post-procedure vital signs reviewed and stable Respiratory status: spontaneous breathing, nonlabored ventilation, respiratory function stable and patient connected to nasal cannula oxygen Cardiovascular status: blood pressure returned to baseline and stable Postop Assessment: no apparent nausea or vomiting Anesthetic complications: no   No notable events documented.   Last Vitals:  Vitals:   09/11/24 1825 09/11/24 2100  BP: (!) 115/57 130/67  Pulse: 85 85  Resp: (!) 22 17  Temp: (!) 36.3 C 36.5 C  SpO2: 94% 97%    Last Pain:  Vitals:   09/11/24 1825  TempSrc:   PainSc: 0-No pain                 Prentice Murphy      "

## 2024-09-11 NOTE — H&P (Signed)
 HISTORY AND PHYSICAL INTERVAL NOTE:  09/11/2024  1:21 PM  Sherri Dickerson  has presented today for surgery, with the diagnosis of right Ankle fracture.  The various methods of treatment have been discussed with the patient.  No guarantees were given.  After consideration of risks, benefits and other options for treatment, the patient has consented to surgery.  I have reviewed the patients chart and labs.    PROCEDURE: ALL RIGHT ANKLE RIGHT TRIMAL FRACTURE ORIF POSSIBLE SYNDESMOTIC LIGAMENT REPAIR POSSIBLE DELTOID LIGAMENT REPAIR ORIF DISTOLATERAL TIBIAL FRACTURE   A history and physical examination was performed in the hospital.  The patient was reexamined.  There have been no changes to this history and physical examination.  Prentice Lee, DPM

## 2024-09-11 NOTE — Plan of Care (Signed)
  Problem: Education: Goal: Knowledge of General Education information will improve Description: Including pain rating scale, medication(s)/side effects and non-pharmacologic comfort measures Outcome: Progressing   Problem: Activity: Goal: Risk for activity intolerance will decrease Outcome: Progressing   Problem: Pain Managment: Goal: General experience of comfort will improve and/or be controlled Outcome: Progressing

## 2024-09-11 NOTE — Op Note (Signed)
 PODIATRY / FOOT AND ANKLE SURGERY OPERATIVE REPORT    SURGEON: Prentice Lee, DPM  PRE-OPERATIVE DIAGNOSIS:  1.  Right trimalleolar closed displaced ankle fracture 2.  Right syndesmotic ligament injury 3.  Right anterior lateral distal tibial fracture, Tillaux, closed, displaced 4.  Disruption of deltoid ligament right medial ankle  POST-OPERATIVE DIAGNOSIS: Same  PROCEDURE(S): Right trimalleolar ankle fracture open reduction with internal fixation Right syndesmotic ligament repair Right deltoid ligament repair ORIF right ankle tillaux fracture Use of intraoperative fluoroscopy  HEMOSTASIS: Right thigh tourniquet  ANESTHESIA: general  ESTIMATED BLOOD LOSS: 30 cc  FINDING(S): 1.  Comminuted distal fibular fracture, closed, displaced 2.  Rupture of syndesmotic ligament 3.  Rupture of deltoid ligament with intraoperative stress testing 4.  Tillaux fracture displaced  PATHOLOGY/SPECIMEN(S): None  INDICATIONS:   Sherri Dickerson is a 64 y.o. female who presents with right ankle injury after a fall and ice.  X-ray imaging and CT scan was performed showing close displaced trimalleolar ankle fracture with syndesmotic disruption possible deltoid ligament injury.  All treatment options were discussed with the patient both conservative and surgical attempts at correction include potential risks and complications at this time patient is elected for surgery consisting of right trimalleolar ankle fracture open reduction with internal fixation with syndesmotic and deltoid ligament repairs and possible repair of anterior lateral distal tibial fracture.  No guarantees given.  Consent obtained prior to procedure.  Discussed smoking cessation while in hospital as well as postoperative lead to improve chances of healing.  DESCRIPTION: After obtaining full informed written consent, the patient was brought back to the operating room and placed supine upon the operating table.  The patient received IV  antibiotics prior to induction.  After obtaining adequate anesthesia, the patient was prepped and draped in the standard fashion.  Anesthesia performed a preoperative popliteal and saphenous nerve block prior to entry in the OR.  An Esmarch bandage was used to exsanguinate the right lower extremity and pneumatic thigh tourniquet was inflated.  C-arm imaging was utilized to verify incision placement over the distal fibula in both the AP and lateral views.  The incision was marked out.  The incision was made and deepened through the subcutaneous tissues utilizing sharp blunt dissection and care was taken to identify and retract all vital neurovascular structures no venous contributories were cauterized necessary.  The incision was taken all the way down to the lateral malleolus and to the distal shaft of the fibula.  At this time the periosteum was identified as well as the deep fascia covering the peroneal muscle belly.  At this time the deep fascia was incised and the peroneal muscle belly was bluntly dissected off the distal fibula.  A periosteal incision was then made into the distal fibula and lateral malleolus and reflected anteriorly and posteriorly thereby exposing the distal fibula at the operative site.  The comminuted fracture was easily able to be visualized.  Appeared to be in 3 distinct pieces with butterfly fracture posteriorly and 2 main fracture fragments proximal and distal.  The fracture appeared to be displaced.  The fracture was mobilized rather easily and reduction clamp was then placed distally to distract the fracture while another reduction clamp was used to reduce the fracture.  The butterfly fragment was also held in place with a temporary K wire.  C-arm imaging was utilized to verify reduction which appeared to be excellent overall.  The fibula appeared to be out to length.  Still appeared to have gap in the  tib-fib overlap but medial clear space appeared to be improved.  At this time a  Paragon 28 10 hole straight plate was then applied to the distal fibula.  Utilizing standard AO principles and techniques 3 locking screws were placed distal to the fracture site with the appropriate orientation and size, 2.7 mm from Paragon 28.  3 locking screws 2.7 mm from Paragon 28 were then placed distal to the fracture site leaving holes open for syndesmotic fixation x 2.  The reduction clamp was removed as well as the temporary wire which held the butterfly fragment in place and reduction appeared to be excellent overall clinically and radiographically.  Fibula appeared to be in near anatomic alignment.    At this time the syndesmosis was reduced with the syndesmotic clamp.  A small incision was made on the medial ankle area.  The reduction clamp was applied through this area to the distal tibia and the lateral reduction part of the clamp was then applied to the plate.  This was compressed as much as possible thereby reducing the syndesmotic injury.  This was checked under fluoroscopic guidance and the ankle appeared to have near anatomic alignment.  At this time to reflex Paragon 28 anchors were then applied from lateral to medial with the appropriate orientation utilizing standard manufactures protocol.  This appeared to compress the syndesmotic region well.  The suture ends were cut as close as possible to the implant.  Attention was then directed through the same incision to the anterior distal lateral tibia where there appeared to be a small fracture fragment consistent with a Tillaux fracture which was slightly displaced.  This was easily able to be mobilized and reduced in the appropriate position.  A K wire was then placed across the area that was for temporary fixation for a 3.0 cannulated Paragon 28 screw.  Another K wire was then applied next to it to hold the fracture fragment in place and to prevent rotation while putting the screw in.  Prior to placing the screw C-arm imaging was utilized to  verify correction which appeared to be excellent and the fragment appeared to be in near anatomic position.  At this time utilizing standard AO principles and techniques a 3.0 x 30 mm partially-threaded headed Paragon 28 screw was placed across the fracture site, excellent compression was noted.  Temporary K wires were then removed from the area.  C-arm imaging was utilized to verify correction which appeared to be excellent overall and the screw appeared to be of the appropriate length and size.  Under intraoperative fluoroscopy stress imaging was then performed with valgus stress testing.  This appeared to produce a talar tilt medially indicative of deltoid rupture.  It was determined at this time to make an incision over the lateral malleolus area.  This incision was made and deepened through the subcutaneous tissues utilizing sharp blunt dissection and care was taken to identify and retract all vital neural and vascular structures and all venous contributories were cauterized as necessary.  The periosteum appeared to be intact to the medial malleolus but a small incision was then made into the deltoid ligament area and periosteum at the attachment point to the tibia at the medial malleolus.  Without making much of an incision through the area that the tissues appeared to be very friable and the deltoid ligament appeared to be partially ruptured.  There also appeared to be a bone fragments that appeared to have some slight area of cartilage present to the  area that was very small.  This bone fragment was removed and passed off the operative site.  The surgical site was flushed with copious amounts normal sterile saline and any hematoma was removed from the ankle joint in this particular region.  At this time a Paragon 28 graft plate anchor was then placed through the medial malleolus with the appropriate orientation and seated.  At this time the suture from the scapular anchor was then applied through the deep  and superficial deltoid and tied into place.  The suture was then cut.  Reinforcement of the repair was then performed with 3-0 Vicryl in a pants over vest type stitch.  Stress imaging was then performed once again and there did not appear to be any displacement or tilt of the talus with valgus stress testing.  Final C-arm imaging was then taken showing anatomic alignment of the ankle joint.  The surgical sites were flushed with copious amounts normal sterile saline.  The periosteal and capsular structures at both incision sites were reapproximated well coapted with 3-0 Vicryl.  The subcutaneous tissues reapproximated well coapted with 3-0 Vicryl.  The skin was then reapproximated well coapted with skin staples.  The pneumatic thigh tourniquet was deflated and a prompt hyperemic response was noted to all digits of the right foot.  A postoperative dressing was then applied consisting of Xeroform followed by 4 x 4 gauze, ABD pad, Kerlix, Webril, posterior splint, Ace wrap's.  The patient tolerated the procedure and anesthesia well and was transferred to the recovery room with vital signs stable and vascular status intact to all toes of the right foot.  Following a period of postoperative monitoring the patient be discharged back to the inpatient room with the appropriate orders, instructions, and medications.  Patient is to remain nonweightbearing at all times to the right lower extremity.  PT/OT has been ordered for tomorrow.  If patient is cleared to go home could potentially consider doing this tomorrow with 1 week follow-up in clinic.  COMPLICATIONS: None  CONDITION: Good, stable  Prentice Lee, DPM

## 2024-09-11 NOTE — Progress Notes (Signed)
" °  PROGRESS NOTE    Sherri Dickerson  FMW:[id number] DOB: 12-20-1960 DOA: 09/10/2024 PCP: Cyrus Mayo Hestle, PA-C  [id number]  LOS: 1 day   Brief hospital course:   Assessment & Plan: Sherri Dickerson is a 64 y.o. female with medical history significant of hypothyroidism, HLD, T2DM, neuropathy and chronic back pain, presents the emergency department for evaluation of left ankle pain and swelling after a mechanical fall on the ice earlier today.  She was previously well and has no other complaints. No LOC or other injury sustained.    Right ankle fracture  --ORIF today   T2DM  --recent A1c 6.0 --d/c BG checks and SSI   HTN --cont irbesartan   --hold hydrochlorothiazide   Hypothyroidism  --cont Synthroid   Depression  --cont home regimen   DVT prophylaxis: Lovenox  SQ Code Status: Full code  Family Communication: husband updated at bedside today Level of care: Med-Surg Dispo:   The patient is from: home Anticipated d/c is to: home Anticipated d/c date is: tomorrow   Subjective and Interval History:  Pain controlled.     Objective: Vitals:   09/11/24 1745 09/11/24 1800 09/11/24 1815 09/11/24 1825  BP: (!) 117/58 (!) 116/58 (!) 117/57 (!) 115/57  Pulse: 84 85 86 85  Resp: 14 17 16  (!) 22  Temp:    (!) 97.3 F (36.3 C)  TempSrc:      SpO2: 96% 94% 94% 94%  Weight:      Height:        Intake/Output Summary (Last 24 hours) at 09/11/2024 1955 Last data filed at 09/11/2024 1728 Gross per 24 hour  Intake 200 ml  Output 10 ml  Net 190 ml   Filed Weights   09/10/24 1530 09/11/24 1347  Weight: 98 kg 98 kg    Examination:   Constitutional: NAD, AAOx3 HEENT: conjunctivae and lids normal, EOMI CV: No cyanosis.   RESP: normal respiratory effort, on RA Neuro: II - XII grossly intact.   Psych: Normal mood and affect.  Appropriate judgement and reason   Data Reviewed: I have personally reviewed labs and imaging studies  Time spent: 50 minutes  Ellouise Haber,  MD [facility] If 7PM-7AM, please contact night-coverage 09/11/2024, 7:55 PM   "

## 2024-09-11 NOTE — Progress Notes (Signed)
" ° ° °  PROCEDURAL EXPEDITER PROGRESS NOTE  Patient Name: Sherri Dickerson  DOB:1961-08-05 Date of Admission: 09/10/2024  Date of Assessment:09/11/24   -------------------------------------------------------------------------------------------------------------------   Brief clinical summary: 64  yr old female fell on ice and going to OR today 09/11/2024  for openreduction internal fixation(ORIF) ankle fracture-right.   Orders in place:  Yes   Communication with surgical team if no orders: n/a  Labs, test, and orders reviewed: yes  Requires surgical clearance:  No  What type of clearance: n/a  Clearance received: n/a  Barriers noted:n/a   Intervention provided by [facility] team: n/a  Barrier resolved:  not applicable   -------------------------------------------------------------------------------------------------------------------  [employer], Ronal DELENA Bald Please contact us  directly via secure chat (search for [facility]) or by calling us  at [phone] [facility]).  "

## 2024-09-12 ENCOUNTER — Encounter: Payer: Self-pay | Admitting: Podiatry

## 2024-09-12 LAB — MISC LABCORP TEST (SEND OUT): Labcorp test code: 83935

## 2024-09-12 LAB — GLUCOSE, CAPILLARY: Glucose-Capillary: 92 mg/dL (ref 70–99)

## 2024-09-12 MED ORDER — ASPIRIN 81 MG PO TBEC: 81.0000 mg | DELAYED_RELEASE_TABLET | Freq: Two times a day (BID) | ORAL | Status: AC

## 2024-09-12 MED ORDER — HYDROCODONE-ACETAMINOPHEN 5-325 MG PO TABS: 1.0000 | ORAL_TABLET | Freq: Four times a day (QID) | ORAL | 0 refills | Status: AC | PRN

## 2024-09-12 NOTE — Evaluation (Signed)
 Occupational Therapy Evaluation Patient Details Name: Sherri Dickerson MRN: [id number] DOB: 05-Feb-1961 Today's Date: 09/12/2024   History of Present Illness   Pt is a 64 y/o F admitted on 09/10/24 after presenting with c/o R ankle pain & swelling after a fall on the ice. Pt found to have R ankle fx, is s/p ORIF on 09/11/24. PMH: Hypothyroidism, HLD, DM2, neuropathy, chronic back pain, lumbar spine surgery     Clinical Impressions Sherri Dickerson was seen for OT treatment on this date. Upon arrival to room pt supine in bed, agreeable to OT eval. Pt denies pain reporting nerve block still in place. She reports living with her spouse and daughter in a 1 level home with ~ 4 STE. She is generally independent at baseline and denies additional falls history in the last 6 months. She endorses mild difficulty with LB access while seated EOB, but voices plan for her daughter or husband to assist her as needed at home. Otherwise feels at or near her baseline for ADL management. She independently adheres to her NWB precautions while using a RW for STS t/fs and functional mobility. Pt eager to utilize a knee scooter upon DC, PT notified. No further skilled OT needs identified. Will sign off at this time. Please re-consult if additional OT needs arise during this hospital stay.       If plan is discharge home, recommend the following:   A little help with bathing/dressing/bathroom     Functional Status Assessment   Patient has not had a recent decline in their functional status     Equipment Recommendations   None recommended by OT     Recommendations for Other Services         Precautions/Restrictions   Precautions Precautions: Fall Restrictions Weight Bearing Restrictions Per Provider Order: Yes LLE Weight Bearing Per Provider Order: Non weight bearing     Mobility Bed Mobility Overal bed mobility: Modified Independent                  Transfers Overall transfer level: Needs  assistance Equipment used: Rolling walker (2 wheels) Transfers: Sit to/from Stand Sit to Stand: Modified independent (Device/Increase time)           General transfer comment: Good adherence to NWB precautions.      Balance Overall balance assessment: Needs assistance Sitting-balance support: Feet supported Sitting balance-Leahy Scale: Normal     Standing balance support: During functional activity, Bilateral upper extremity supported Standing balance-Leahy Scale: Good                             ADL either performed or assessed with clinical judgement   ADL Overall ADL's : Needs assistance/impaired                                       General ADL Comments: MIN A for LB ADL management, States family supports at bedside plans to assist upon DC home. Pt with good recally/carryover of safe use of AE for LB ADL management from previous injury/surgery. Denies concerns for ADL/IADL management at this time.     Vision Baseline Vision/History: 1 Wears glasses Ability to See in Adequate Light: 1 Impaired Patient Visual Report: No change from baseline       Perception         Praxis  Pertinent Vitals/Pain Pain Assessment Pain Assessment: No/denies pain (endorses nerve block still in place.) Faces Pain Scale: No hurt     Extremity/Trunk Assessment Upper Extremity Assessment Upper Extremity Assessment: Overall WFL for tasks assessed   Lower Extremity Assessment Lower Extremity Assessment: RLE deficits/detail RLE Deficits / Details: RLE NWB, in cast. RLE: Unable to fully assess due to immobilization   Cervical / Trunk Assessment Cervical / Trunk Assessment: Normal   Communication Communication Communication: No apparent difficulties   Cognition Arousal: Alert Behavior During Therapy: WFL for tasks assessed/performed                                 Following commands: Intact       Cueing  General  Comments   Cueing Techniques: Verbal cues  educated pt on benefits of elevating RLE after mobility   Exercises Other Exercises Other Exercises: Pt/caregiver educated on role of OT in acute setting, safety, falls prevention strategies, and DC recs.   Shoulder Instructions      Home Living Family/patient expects to be discharged to:: Private residence Living Arrangements: Spouse/significant other;Children Available Help at Discharge: Family;Available PRN/intermittently Type of Home: House Home Access: Stairs to enter Entrance Stairs-Number of Steps: 4 at back Entrance Stairs-Rails: Left Home Layout: Laundry or work area in basement;One level     Bathroom Shower/Tub: Walk-in shower;Door         Home Equipment: Agricultural Consultant (2 wheels);Shower seat - built in;Crutches;Other (comment)   Additional Comments: knee scooter      Prior Functioning/Environment               Mobility Comments: recently retired, independent without AD, driving ADLs Comments: independent with ADL, IADL. Denies additional falls in last 6 months. Retired in Dec, 2025.    OT Problem List: Decreased coordination;Decreased range of motion;Decreased knowledge of use of DME or AE;Impaired balance (sitting and/or standing)   OT Treatment/Interventions:        OT Goals(Current goals can be found in the care plan section)   Acute Rehab OT Goals Patient Stated Goal: To go home OT Goal Formulation: All assessment and education complete, DC therapy Time For Goal Achievement: 09/12/24 Potential to Achieve Goals: Good   OT Frequency:       Co-evaluation              AM-PAC OT 6 Clicks Daily Activity     Outcome Measure Help from another person eating meals?: None Help from another person taking care of personal grooming?: None Help from another person toileting, which includes using toliet, bedpan, or urinal?: None Help from another person bathing (including washing, rinsing, drying)?: A  Little Help from another person to put on and taking off regular upper body clothing?: None Help from another person to put on and taking off regular lower body clothing?: A Little 6 Click Score: 22   End of Session Equipment Utilized During Treatment: Gait belt;Rolling walker (2 wheels)  Activity Tolerance: Patient tolerated treatment well Patient left: in bed;with bed alarm set;with family/visitor present  OT Visit Diagnosis: Other abnormalities of gait and mobility (R26.89)                Time: 9170-9141 OT Time Calculation (min): 29 min Charges:  OT General Charges $OT Visit: 1 Visit OT Evaluation $OT Eval Low Complexity: 1 Low OT Treatments $Self Care/Home Management : 8-22 mins  Jhonny Pelton, M.S., OTR/L 09/12/24, 1:01 PM

## 2024-09-12 NOTE — Progress Notes (Signed)
 PODIATRY: PROGRESS NOTE    Surgery:  Procedures (LRB): OPEN REDUCTION INTERNAL FIXATION (ORIF) ANKLE FRACTURE (Right) POD:  1 Day Post-Op  O/N: NAEON  Subjective:  Patient resting comfortably at bedside. Husband at bedside ensuring ability to DC with appropriate compliance of post op instructions. No pain, to the RLE - block in effect / mild numbness to toes.  Denies F/C/N/V/SOB/CP. Denies acute calf pain.    PHYSICAL EXAMINATION: BP 118/66 (BP Location: Right Arm)   Pulse 81   Temp 98.2 F (36.8 C)   Resp 20   Ht 5' 6 (1.676 m)   Wt 98 kg   SpO2 98%   BMI 34.86 kg/m ? GEN: NAD. AOX3. ? RESP: Non-labored breathing on RA.? ABD: NT/ND of all four quadrants.? NEURO: Moving all four extremities spontaneously. ? ? FOCUSED LOWER EXTREMITY EXAMINATION:? NEURO: ? - SLIT to tib/saph/dp/sp/sural nerve distributions. ? - No paresthesias elicited on examination. ??  VASCULAR: ? - Capillary refill <3 seconds. ?  MSK: ?. ? - No gross skeletal deformities noted. ?Neutral to mild inversion splint.  - No calf tenderness. ? - Able to wiggle toes  DERM: ? - Dressings c/d/I. ??  Results for orders placed or performed during the hospital encounter of 09/10/24  Surgical pcr screen     Status: Abnormal   Collection Time: 09/11/24  4:45 AM   Specimen: Nasal Mucosa; Nasal Swab  Result Value Ref Range Status   MRSA, PCR NEGATIVE NEGATIVE Final   Staphylococcus aureus POSITIVE (A) NEGATIVE Final    Comment: (NOTE) The Xpert SA Assay (FDA approved for NASAL specimens in patients 56 years of age and older), is one component of a comprehensive surveillance program. It is not intended to diagnose infection nor to guide or monitor treatment. Performed at [facility], [address] [place], [place] [address]     * No specimens in log *   ASSESSMENT:?  Sherri Dickerson is a 64 y.o. female ?   S/p RIGHT trimalleolar fracture/ dislocation, doing well.   Requires sign  off by PT/OT then okay to DC from podiatry standpoint.    PLAN:? - Activity: NWB on the RIGHT lower extremity   - Diet: Per primary  - DVT ppx: ASA 81mg  BID x 6 weeks   - Wound Care: Podiatry to conduct post-op. To leave splint CDI and will be changed in clinic.   - ABX: Will not require on DC  Anti-infectives (From admission, onward)    Start     Dose/Rate Route Frequency Ordered Stop   09/11/24 1330  ceFAZolin  (ANCEF ) IVPB 2g/100 mL premix        2 g 200 mL/hr over 30 Minutes Intravenous On call to O.R. 09/10/24 2148 09/11/24 1543       - Dispo: 1 week with Dr. Lennie in outpatient setting.

## 2024-09-12 NOTE — Evaluation (Signed)
 Physical Therapy Evaluation Patient Details Name: Sherri Dickerson MRN: [id number] DOB: 05-Oct-1960 Today's Date: 09/12/2024  History of Present Illness  Pt is a 64 y/o F admitted on 09/10/24 after presenting with c/o R ankle pain & swelling after a fall on the ice. Pt found to have R ankle fx, is s/p ORIF on 09/11/24. PMH: Hypothyroidism, HLD, DM2, neuropathy, chronic back pain, lumbar spine surgery  Clinical Impression  Pt seen for PT evaluation with pt agreeable to tx, spouse present for session. Prior to admission pt was independent without AD, recently retired, driving. On this date, pt aware of NWB RLE, reports she's used knee scooter in the past. Pt is able to ambulate with knee scooter with supervision. Educated pt on stair negotiation with rail & crutch with pt attempting but preferring to bump up/down on her bottom with pt return demonstrating. Pt & spouse both voice comfort with d/c home today.         If plan is discharge home, recommend the following: A little help with walking and/or transfers;A little help with bathing/dressing/bathroom;Assistance with cooking/housework;Help with stairs or ramp for entrance;Assist for transportation   Can travel by private vehicle        Equipment Recommendations None recommended by PT  Recommendations for Other Services       Functional Status Assessment Patient has had a recent decline in their functional status and demonstrates the ability to make significant improvements in function in a reasonable and predictable amount of time.     Precautions / Restrictions Precautions Precautions: Fall Restrictions Weight Bearing Restrictions Per Provider Order: Yes LLE Weight Bearing Per Provider Order: Non weight bearing (R ankle)      Mobility  Bed Mobility Overal bed mobility: Modified Independent             General bed mobility comments: supine>sit to exit R side of bed    Transfers Overall transfer level: Needs  assistance Equipment used:  (knee scooter) Transfers: Sit to/from Stand Sit to Stand: Supervision           General transfer comment: min cuing for brake management    Ambulation/Gait Ambulation/Gait assistance: Supervision Gait Distance (Feet): 150 Feet (+ 150 ft) Assistive device: Knee scooter Gait Pattern/deviations: Step-to pattern          Stairs Stairs: Yes       General stair comments: Provided education re: stair negotiation with L rail & RUE crutch with pt return demonstrating x 1 step with min assist but reporting she'd feel better to bump on her bottom. Pt demonstrated ability to transition to/from steps to bump up on her bottom with supervision. Educated pt & spouse on use of chair to transition to at the top of the steps before transitioning to standing; they voiced understanding & declined practicing this.  Wheelchair Mobility     Tilt Bed    Modified Rankin (Stroke Patients Only)       Balance Overall balance assessment: Needs assistance Sitting-balance support: Feet supported Sitting balance-Leahy Scale: Good     Standing balance support: During functional activity, Single extremity supported Standing balance-Leahy Scale: Good                               Pertinent Vitals/Pain Pain Assessment Pain Assessment: Faces Faces Pain Scale: Hurts a little bit Pain Location: chronic back pain Pain Descriptors / Indicators: Discomfort Pain Intervention(s): Monitored during session    Home Living  Family/patient expects to be discharged to:: Private residence Living Arrangements: Spouse/significant other;Children Available Help at Discharge: Family;Available PRN/intermittently Type of Home: House Home Access: Stairs to enter Entrance Stairs-Rails: Left Entrance Stairs-Number of Steps: 4 at back   Home Layout: Laundry or work area in basement;One level Home Equipment: Agricultural [employer] (2 wheels);Shower seat - built in;Crutches;Other  (comment) Additional Comments: knee scooter    Prior Function               Mobility Comments: recently retired, independent without AD, driving ADLs Comments: independent     Extremity/Trunk Assessment   Upper Extremity Assessment Upper Extremity Assessment: Overall WFL for tasks assessed    Lower Extremity Assessment Lower Extremity Assessment: Overall WFL for tasks assessed;RLE deficits/detail RLE: Unable to fully assess due to immobilization (R ankle)       Communication   Communication Communication: No apparent difficulties    Cognition Arousal: Alert Behavior During Therapy: WFL for tasks assessed/performed   PT - Cognitive impairments: No apparent impairments                         Following commands: Intact       Cueing Cueing Techniques: Verbal cues     General Comments General comments (skin integrity, edema, etc.): educated pt on benefits of elevating RLE after mobility    Exercises     Assessment/Plan    PT Assessment Patient needs continued PT services  PT Problem List Decreased strength;Decreased range of motion;Decreased activity tolerance;Decreased balance;Decreased mobility;Decreased knowledge of use of DME;Cardiopulmonary status limiting activity       PT Treatment Interventions Therapeutic exercise;DME instruction;Gait training;Balance training;Stair training;Neuromuscular re-education;Functional mobility training;Therapeutic activities;Patient/family education;Modalities    PT Goals (Current goals can be found in the Care Plan section)  Acute Rehab PT Goals Patient Stated Goal: go home PT Goal Formulation: With patient Time For Goal Achievement: 09/25/24 Potential to Achieve Goals: Good    Frequency 7X/week     Co-evaluation               AM-PAC PT 6 Clicks Mobility  Outcome Measure Help needed turning from your back to your side while in a flat bed without using bedrails?: None Help needed moving from  lying on your back to sitting on the side of a flat bed without using bedrails?: None Help needed moving to and from a bed to a chair (including a wheelchair)?: A Little Help needed standing up from a chair using your arms (e.g., wheelchair or bedside chair)?: A Little Help needed to walk in hospital room?: A Little Help needed climbing 3-5 steps with a railing? : A Little 6 Click Score: 20    End of Session   Activity Tolerance: Patient tolerated treatment well Patient left: in bed;with call bell/phone within reach;with family/visitor present Nurse Communication: Mobility status PT Visit Diagnosis: Other abnormalities of gait and mobility (R26.89);Muscle weakness (generalized) (M62.81)    Time: 9051-8989 PT Time Calculation (min) (ACUTE ONLY): 22 min   Charges:   PT Evaluation $PT Eval Low Complexity: 1 Low PT Treatments $Gait Training: 8-22 mins PT General Charges $$ ACUTE PT VISIT: 1 Visit         Richerd Pinal, PT, DPT 09/12/24, 10:28 AM   Richerd CHRISTELLA Pinal 09/12/2024, 10:27 AM

## 2024-09-12 NOTE — Progress Notes (Signed)
 All discharge requirements met.

## 2024-09-12 NOTE — Discharge Summary (Signed)
 "  Physician Discharge Summary   Sherri Dickerson  female DOB: 1960-10-01  FMW:[id number]  PCP: Cyrus Selinda Moose, PA-C  Admit date: 09/10/2024 Discharge date: 09/12/2024  Admitted From: home Disposition:  home Husband updated at bedside prior to discharge. CODE STATUS: Full code  Discharge Instructions     Discharge instructions   Complete by: As directed    remain nonweightbearing at all times to the right leg.    Please take aspirin  81 mg twice daily for 2 weeks, or until you follow up with podiatry.  1 week follow-up in podiatry clinic. - -   No wound care   Complete by: As directed       Hospital Course:  For full details, please see H&P, progress notes, consult notes and ancillary notes.  Briefly,  Sherri Dickerson is a 64 y.o. female with medical history significant of hypothyroidism, T2DM, neuropathy and chronic back pain, presented to the emergency department for evaluation of left ankle pain and swelling after a mechanical fall on the ice earlier in the day.  She was previously well and has no other complaints. No LOC or other injury sustained.    Right ankle fracture  S/p ORIF on 09/11/24 with Dr. Lennie --NWB on the RIGHT lower extremity  --ASA 81mg  BID for ppx --outpatient f/u with Dr. Lennie in about a week   T2DM  --recent A1c 6.0 --resume home metformin  and MOUNJARO after discharge   HTN --cont home Benicar  HCT   Hypothyroidism  --cont Synthroid    Depression  --cont home regimen as below   Unless noted above, medications under STOP list are ones pt was not taking PTA.  Discharge Diagnoses:  Principal Problem:   Ankle fracture, right, closed, initial encounter   30 Day Unplanned Readmission Risk Score    Flowsheet Row ED to Hosp-Admission (Current) from 09/10/2024 in [facility] (1A)  30 Day Unplanned Readmission Risk Score (%) 7.39 Filed at 09/11/2024 1200    This score is the patient's risk of an  unplanned readmission within 30 days of being discharged (0 -100%). The score is based on dignosis, age, lab data, medications, orders, and past utilization.   Low:  0-14.9   Medium: 15-21.9   High: 22-29.9   Extreme: 30 and above         Discharge Instructions:  Allergies as of 09/12/2024       Reactions   Liraglutide Other (See Comments)   History of thyroid  cancer   Macrobid [nitrofurantoin] Other (See Comments)   Flu like symptoms   Morphine    Headaches    Wellbutrin [bupropion] Other (See Comments)   Suicidal thoughts        Medication List     STOP taking these medications    benzonatate 200 MG capsule Commonly known as: TESSALON   gabapentin  600 MG tablet Commonly known as: NEURONTIN    methylPREDNISolone  4 MG Tbpk tablet Commonly known as: MEDROL  DOSEPAK       TAKE these medications    acetaminophen  500 MG tablet Commonly known as: TYLENOL  Take 1,500 mg by mouth daily as needed for moderate pain (pain score 4-6).   aspirin  EC 81 MG tablet Take 1 tablet (81 mg total) by mouth 2 (two) times daily for 14 days. Swallow whole.   Calcium  600+D 600-20 MG-MCG Tabs Generic drug: Calcium  Carb-Cholecalciferol Take 1 tablet by mouth daily.   celecoxib  100 MG capsule Commonly known as: CELEBREX  Take 100 mg by mouth 2 (  two) times daily.   cetirizine 10 MG tablet Commonly known as: ZYRTEC Take 10 mg by mouth daily.   citalopram  20 MG tablet Commonly known as: CELEXA  Take 20 mg by mouth at bedtime.   fenofibrate  48 MG tablet Commonly known as: TRICOR  Take 48 mg by mouth at bedtime.   HYDROcodone -acetaminophen  5-325 MG tablet Commonly known as: NORCO/VICODIN Take 1 tablet by mouth every 6 (six) hours as needed for severe pain (pain score 7-10).   levothyroxine  150 MCG tablet Commonly known as: SYNTHROID  Take 150 mcg by mouth daily before breakfast.   Magnesium  400 MG Tabs Take 400 mg by mouth at bedtime.   Melatonin 10 MG Caps Take 10 mg by  mouth at bedtime.   metFORMIN  500 MG 24 hr tablet Commonly known as: GLUCOPHAGE -XR Take 1,000 mg by mouth at bedtime.   methocarbamol  500 MG tablet Commonly known as: ROBAXIN  Take 500 mg by mouth at bedtime.   MILK THISTLE PO Take 1 tablet by mouth daily.   Mounjaro 5 MG/0.5ML Pen Generic drug: tirzepatide Inject 5 mg into the skin once a week.   Multi-Vitamins Tabs Take 1 tablet by mouth daily.   nortriptyline  10 MG capsule Commonly known as: PAMELOR  Take 10-20 mg by mouth See admin instructions. Take 10 mg in the morning and 20 mg at night   olmesartan -hydrochlorothiazide  20-12.5 MG tablet Commonly known as: BENICAR  HCT Take 1 tablet by mouth daily.   RABEprazole 20 MG tablet Commonly known as: ACIPHEX Take 20 mg by mouth in the morning and at bedtime.   rosuvastatin  40 MG tablet Commonly known as: CRESTOR  Take 40 mg by mouth at bedtime.   tiZANidine  4 MG tablet Commonly known as: Zanaflex  Take 0.5-1 tablets (2-4 mg total) by mouth 3 (three) times daily as needed for muscle spasms.   traZODone  100 MG tablet Commonly known as: DESYREL  Take 100 mg by mouth at bedtime.   venlafaxine  XR 150 MG 24 hr capsule Commonly known as: EFFEXOR -XR Take 150 mg by mouth at bedtime.   vitamin B-12 500 MCG tablet Commonly known as: CYANOCOBALAMIN Take 500 mcg by mouth daily.   vitamin C 1000 MG tablet Take 1,000 mg by mouth daily.   Vitamin D3 Maximum Strength 125 MCG (5000 UT) capsule Generic drug: Cholecalciferol Take 5,000 Units by mouth daily.   Zinc 50 MG Tabs Take 50 mg by mouth daily.         Follow-up Information     Lennie Barter, DPM Follow up in 1 week(s).   Specialty: Podiatry Contact information: [address] [place] [address] [phone]                 Allergies[1]   The results of significant diagnostics from this hospitalization (including imaging, microbiology, ancillary and laboratory) are listed below for  reference.   Consultations:   Procedures/Studies: DG Ankle 2 Views Right Result Date: 09/11/2024 EXAM: 2 VIEW(S) XRAY OF THE RIGHT ANKLE 09/11/2024 05:11:00 PM CLINICAL HISTORY: [id number] Surgery, elective [id number] Surgery, elective. COMPARISON: None available. FINDINGS: BONES AND JOINTS: Suki fluoroscopic intraoperative radiographs demonstrate surgical changes of distal fibular diaphyseal and anteromedial intraarticular distal tibial fracture ORIF. Final image demonstrates fracture fragments in near anatomic alignment. Fluoroscopic images: 7, fluoroscopy time 50 seconds, fluoroscopic dose 1.7176 mg. SOFT TISSUES: Unremarkable. IMPRESSION: 1. Surgical changes of distal fibular diaphyseal and anteromedial intra-articular distal tibial fracture ORIF. 2. Fracture fragments in near anatomic alignment. Electronically signed by: Dorethia Molt MD 09/11/2024 08:26 PM EST RP Workstation:  [id number]   DG C-Arm 1-60 Min-No Report Result Date: 09/11/2024 Fluoroscopy was utilized by the requesting physician.  No radiographic interpretation.   DG C-Arm 1-60 Min-No Report Result Date: 09/11/2024 Fluoroscopy was utilized by the requesting physician.  No radiographic interpretation.   US  OR NERVE BLOCK-IMAGE ONLY [facility]) Result Date: 09/11/2024 There is no interpretation for this exam.  This order is for images obtained during a surgical procedure.  Please See Surgeries Tab for more information regarding the procedure.   US  OR NERVE BLOCK-IMAGE ONLY [facility]) Result Date: 09/11/2024 There is no interpretation for this exam.  This order is for images obtained during a surgical procedure.  Please See Surgeries Tab for more information regarding the procedure.   CT Ankle Right Wo Contrast Result Date: 09/10/2024 EXAM: CT RIGHT ANKLE, WITHOUT IV CONTRAST 09/10/2024 05:44:15 PM TECHNIQUE: Axial images were acquired through the right ankle without IV contrast. Reformatted images were reviewed. Automated exposure control,  iterative reconstruction, and/or weight based adjustment of the mA/kV was utilized to reduce the radiation dose to as low as reasonably achievable. COMPARISON: None provided. CLINICAL HISTORY: Ankle trauma, fracture, x-ray done. Age greater than or equal to 5 years. FINDINGS: BONES: Acute comminuted and displaced transyndesmotic distal fibular fracture. Acute displaced intra-articular fracture of the distal tibia involving the lateral aspect of the tibiotalar joint and syndesmosis (5.27). Acute displaced avulsion fracture of the medial malleolus. Likely tiny acute avulsion fracture of the posterior malleolus (6.41). Slight cortical step-off of the lateral aspect of the talar dome may represent a tiny nondisplaced fracture (5:30). Cortical irregularity of the talar head anterior superiorly with no definite fracture (3867). Likely old healed talar neck fracture (3666). No other acute displaced fracture of the partially visualized foot and remaining bones of the ankle. JOINTS: Slight subluxation of the tibiotalar joint in the setting of a lateral distal tibial fracture involving the tibial plafond. No widening of the medial clear space. No dislocation. SOFT TISSUES: Associated soft tissue edema. No retained radiopaque foreign body. No subcutaneous soft tissue emphysema. IMPRESSION: 1. Acute displaced intra-articular fracture of the distal tibia involving the lateral aspect of the tibiotalar joint and syndesmosis. Associated slight subluxation of the tibiotalar joint. 2. Acute comminuted and displaced transsyndesmotic distal fibular fracture. 3. Acute displaced avulsion fracture of the medial malleolus. 4. Likely tiny acute avulsion fracture of the posterior malleolus. 5. Slight cortical step-off of the lateral aspect of the talar dome, which may represent a tiny nondisplaced fracture. Electronically signed by: Morgane Naveau MD 09/10/2024 06:26 PM EST RP Workstation: [id number]   DG Ankle Complete Right Result Date:  09/10/2024 CLINICAL DATA:  Deformity post fall. EXAM: RIGHT ANKLE - COMPLETE 3+ VIEW COMPARISON:  None Available. FINDINGS: Mildly displaced spiral fracture involving the distal fibula. Fibular fracture is above the ankle joint. Fracture involving the distal tibia along the lateral aspect of the ankle joint. There is a displaced bone fragment at this fracture. Prominent spur involving the medial malleolus and difficult to exclude a subtle fracture in this area. Soft tissue swelling in the ankle. Ankle is located. Spurring along the plantar aspect of the calcaneus. IMPRESSION: 1. Mildly displaced fracture involving the distal fibula. 2. Intra-articular displaced fracture involving the distal tibia along the lateral aspect of the ankle joint. 3. Prominent spur involving the medial malleolus. Difficult to exclude a subtle fracture in this area. Electronically Signed   By: Juliene Balder M.D.   On: 09/10/2024 16:33      Labs: BNP (last 3 results) No  results for input(s): BNP in the last 8760 hours. Basic Metabolic Panel: Recent Labs  Lab 09/10/24 1713  NA 140  K 3.8  CL 101  CO2 29  GLUCOSE 116*  BUN 11  CREATININE 0.94  CALCIUM  9.9   Liver Function Tests: Recent Labs  Lab 09/10/24 1713  AST 36  ALT 33  ALKPHOS 67  BILITOT 0.3  PROT 7.1  ALBUMIN  4.6   No results for input(s): LIPASE, AMYLASE in the last 168 hours. No results for input(s): AMMONIA in the last 168 hours. CBC: Recent Labs  Lab 09/10/24 1713  WBC 9.3  HGB 13.8  HCT 40.4  MCV 92.9  PLT 223   Cardiac Enzymes: No results for input(s): CKTOTAL, CKMB, CKMBINDEX, TROPONINI in the last 168 hours. BNP: Invalid input(s): POCBNP CBG: Recent Labs  Lab 09/10/24 2151 09/11/24 0758 09/11/24 1124 09/11/24 1359 09/11/24 1739  GLUCAP 104* 115* 100* 99 157*   D-Dimer No results for input(s): DDIMER in the last 72 hours. Hgb A1c Recent Labs    09/10/24 1713  HGBA1C 6.0*   Lipid Profile No  results for input(s): CHOL, HDL, LDLCALC, TRIG, CHOLHDL, LDLDIRECT in the last 72 hours. Thyroid  function studies Recent Labs    09/10/24 1713  TSH 3.280   Anemia work up No results for input(s): VITAMINB12, FOLATE, FERRITIN, TIBC, IRON, RETICCTPCT in the last 72 hours. Urinalysis    Component Value Date/Time   COLORURINE YELLOW (A) 02/09/2024 1309   APPEARANCEUR HAZY (A) 02/09/2024 1309   APPEARANCEUR Clear 08/28/2013 1023   LABSPEC 1.013 02/09/2024 1309   LABSPEC 1.016 08/28/2013 1023   PHURINE 8.0 02/09/2024 1309   GLUCOSEU NEGATIVE 02/09/2024 1309   GLUCOSEU Negative 08/28/2013 1023   HGBUR NEGATIVE 02/09/2024 1309   BILIRUBINUR NEGATIVE 02/09/2024 1309   BILIRUBINUR Negative 08/28/2013 1023   KETONESUR NEGATIVE 02/09/2024 1309   PROTEINUR NEGATIVE 02/09/2024 1309   NITRITE NEGATIVE 02/09/2024 1309   LEUKOCYTESUR NEGATIVE 02/09/2024 1309   LEUKOCYTESUR Negative 08/28/2013 1023   Sepsis Labs Recent Labs  Lab 09/10/24 1713  WBC 9.3   Microbiology Recent Results (from the past 240 hours)  Surgical pcr screen     Status: Abnormal   Collection Time: 09/11/24  4:45 AM   Specimen: Nasal Mucosa; Nasal Swab  Result Value Ref Range Status   MRSA, PCR NEGATIVE NEGATIVE Final   Staphylococcus aureus POSITIVE (A) NEGATIVE Final    Comment: (NOTE) The Xpert SA Assay (FDA approved for NASAL specimens in patients 69 years of age and older), is one component of a comprehensive surveillance program. It is not intended to diagnose infection nor to guide or monitor treatment. Performed at [facility], [address] [place], [place] [address]      Total time spend on discharging this patient, including the last patient exam, discussing the hospital stay, instructions for ongoing care as it relates to all pertinent caregivers, as well as preparing the medical discharge records, prescriptions, and/or referrals as applicable, is 35  minutes.    Ellouise Haber, MD  [facility] 09/12/2024, 10:47 AM       [1]  Allergies Allergen Reactions   Liraglutide Other (See Comments)    History of thyroid  cancer   Macrobid [Nitrofurantoin] Other (See Comments)    Flu like symptoms   Morphine     Headaches    Wellbutrin [Bupropion] Other (See Comments)    Suicidal thoughts   "

## 2024-09-12 NOTE — Plan of Care (Signed)
" °  Problem: Education: Goal: Knowledge of General Education information will improve Description: Including pain rating scale, medication(s)/side effects and non-pharmacologic comfort measures Outcome: Progressing   Problem: Health Behavior/Discharge Planning: Goal: Ability to manage health-related needs will improve Outcome: Progressing   Problem: Clinical Measurements: Goal: Ability to maintain clinical measurements within normal limits will improve Outcome: Progressing Goal: Will remain free from infection Outcome: Progressing Goal: Diagnostic test results will improve Outcome: Progressing Goal: Respiratory complications will improve Outcome: Progressing Goal: Cardiovascular complication will be avoided Outcome: Progressing   Problem: Activity: Goal: Risk for activity intolerance will decrease Outcome: Progressing   Problem: Nutrition: Goal: Adequate nutrition will be maintained Outcome: Progressing   Problem: Coping: Goal: Level of anxiety will decrease Outcome: Progressing   Problem: Elimination: Goal: Will not experience complications related to bowel motility Outcome: Progressing Goal: Will not experience complications related to urinary retention Outcome: Progressing   Problem: Pain Managment: Goal: General experience of comfort will improve and/or be controlled Outcome: Progressing   Problem: Safety: Goal: Ability to remain free from injury will improve Outcome: Progressing   Problem: Skin Integrity: Goal: Risk for impaired skin integrity will decrease Outcome: Progressing   Problem: Education: Goal: Ability to describe self-care measures that may prevent or decrease complications (Diabetes Survival Skills Education) will improve Outcome: Progressing Goal: Individualized Educational Video(s) Outcome: Progressing   Problem: Coping: Goal: Ability to adjust to condition or change in health will improve Outcome: Progressing   Problem: Fluid  Volume: Goal: Ability to maintain a balanced intake and output will improve Outcome: Progressing   Problem: Health Behavior/Discharge Planning: Goal: Ability to identify and utilize available resources and services will improve Outcome: Progressing Goal: Ability to manage health-related needs will improve Outcome: Progressing   Problem: Metabolic: Goal: Ability to maintain appropriate glucose levels will improve Outcome: Progressing   Problem: Nutritional: Goal: Maintenance of adequate nutrition will improve Outcome: Progressing Goal: Progress toward achieving an optimal weight will improve Outcome: Progressing   Problem: Skin Integrity: Goal: Risk for impaired skin integrity will decrease Outcome: Progressing   Problem: Tissue Perfusion: Goal: Adequacy of tissue perfusion will improve Outcome: Progressing   Problem: Education: Goal: Knowledge of the prescribed therapeutic regimen will improve Outcome: Progressing   Problem: Bowel/Gastric: Goal: Gastrointestinal status for postoperative course will improve Outcome: Progressing   Problem: Cardiac: Goal: Ability to maintain an adequate cardiac output Outcome: Progressing Goal: Will show no evidence of cardiac arrhythmias Outcome: Progressing   Problem: Nutritional: Goal: Will attain and maintain optimal nutritional status Outcome: Progressing   Problem: Neurological: Goal: Will regain or maintain usual level of consciousness Outcome: Progressing   Problem: Clinical Measurements: Goal: Ability to maintain clinical measurements within normal limits Outcome: Progressing Goal: Postoperative complications will be avoided or minimized Outcome: Progressing   Problem: Respiratory: Goal: Will regain and/or maintain adequate ventilation Outcome: Progressing Goal: Respiratory status will improve Outcome: Progressing   Problem: Skin Integrity: Goal: Demonstrates signs of wound healing without infection Outcome:  Progressing   Problem: Urinary Elimination: Goal: Will remain free from infection Outcome: Progressing Goal: Ability to achieve and maintain adequate urine output Outcome: Progressing   "

## 2024-09-12 NOTE — Plan of Care (Signed)
 " Problem: Education: Goal: Knowledge of General Education information will improve Description: Including pain rating scale, medication(s)/side effects and non-pharmacologic comfort measures 09/12/2024 1105 by Conlin Brahm, [facility], RN Outcome: Adequate for Discharge 09/12/2024 1104 by Binyomin Brann, [facility], RN Outcome: Adequate for Discharge   Problem: Health Behavior/Discharge Planning: Goal: Ability to manage health-related needs will improve 09/12/2024 1105 by Chrisa Hassan, [facility], RN Outcome: Adequate for Discharge 09/12/2024 1104 by Kayliah Tindol, [facility], RN Outcome: Adequate for Discharge   Problem: Clinical Measurements: Goal: Ability to maintain clinical measurements within normal limits will improve 09/12/2024 1105 by Charlina Dwight, [facility], RN Outcome: Adequate for Discharge 09/12/2024 1104 by Velencia Lenart, [facility], RN Outcome: Adequate for Discharge Goal: Will remain Kein Carlberg from infection 09/12/2024 1105 by Rayquan Amrhein, [facility], RN Outcome: Adequate for Discharge 09/12/2024 1104 by [facility], RN Outcome: Adequate for Discharge Goal: Diagnostic test results will improve 09/12/2024 1105 by Ensley Blas, [facility], RN Outcome: Adequate for Discharge 09/12/2024 1104 by [employer]OR, RN Outcome: Adequate for Discharge Goal: Respiratory complications will improve 09/12/2024 1105 by Reigan Tolliver, [facility], RN Outcome: Adequate for Discharge 09/12/2024 1104 by [employer]OR, RN Outcome: Adequate for Discharge Goal: Cardiovascular complication will be avoided 09/12/2024 1105 by Leahanna Buser, [facility], RN Outcome: Adequate for Discharge 09/12/2024 1104 by Aaliyah Cancro, [facility], RN Outcome: Adequate for Discharge   Problem: Activity: Goal: Risk for activity intolerance will decrease 09/12/2024 1105 by Kamaal Cast, [facility], RN Outcome: Adequate for Discharge 09/12/2024 1104 by Kennetta Pavlovic, [facility], RN Outcome: Adequate for Discharge   Problem: Nutrition: Goal: Adequate nutrition will be maintained 09/12/2024 1105 by Anastazia Creek, [facility],  RN Outcome: Adequate for Discharge 09/12/2024 1104 by Mickaela Starlin, [facility], RN Outcome: Adequate for Discharge   Problem: Coping: Goal: Level of anxiety will decrease 09/12/2024 1105 by Jahfari Ambers, [facility], RN Outcome: Adequate for Discharge 09/12/2024 1104 by Miesha Bachmann, [facility], RN Outcome: Adequate for Discharge   Problem: Elimination: Goal: Will not experience complications related to bowel motility 09/12/2024 1105 by Asia Dusenbury, [facility], RN Outcome: Adequate for Discharge 09/12/2024 1104 by Rickell Wiehe, [facility], RN Outcome: Adequate for Discharge Goal: Will not experience complications related to urinary retention 09/12/2024 1105 by Madellyn Denio, [facility], RN Outcome: Adequate for Discharge 09/12/2024 1104 by Inioluwa Boulay, [facility], RN Outcome: Adequate for Discharge   Problem: Pain Managment: Goal: General experience of comfort will improve and/or be controlled 09/12/2024 1105 by Mirl Hillery, [facility], RN Outcome: Adequate for Discharge 09/12/2024 1104 by Treanna Dumler, [facility], RN Outcome: Adequate for Discharge   Problem: Safety: Goal: Ability to remain Freddie Nghiem from injury will improve 09/12/2024 1105 by Ulises Wolfinger, [facility], RN Outcome: Adequate for Discharge 09/12/2024 1104 by Destynie Toomey, [facility], RN Outcome: Adequate for Discharge   Problem: Skin Integrity: Goal: Risk for impaired skin integrity will decrease 09/12/2024 1105 by Gerda Yin, [facility], RN Outcome: Adequate for Discharge 09/12/2024 1104 by Ovella Manygoats, [facility], RN Outcome: Adequate for Discharge   Problem: Education: Goal: Ability to describe self-care measures that may prevent or decrease complications (Diabetes Survival Skills Education) will improve 09/12/2024 1105 by Nil Xiong, [facility], RN Outcome: Adequate for Discharge 09/12/2024 1104 by [facility], RN Outcome: Adequate for Discharge Goal: Individualized Educational Video(s) 09/12/2024 1105 by Rutherford [facility], RN Outcome: Adequate for Discharge 09/12/2024 1104 by Debany Vantol, [facility], RN Outcome: Adequate  for Discharge   Problem: Coping: Goal: Ability to adjust to condition or change in health will improve 09/12/2024 1105 by [facility], RN Outcome: Adequate for Discharge 09/12/2024 1104  by Rutherford [facility], RN Outcome: Adequate for Discharge   Problem: Fluid Volume: Goal: Ability to maintain a balanced intake and output will improve 09/12/2024 1105 by Whisper Kurka, [facility], RN Outcome: Adequate for Discharge 09/12/2024 1104 by Ann Groeneveld, [facility], RN Outcome: Adequate for Discharge   Problem: Health Behavior/Discharge Planning: Goal: Ability to identify and utilize available resources and services will improve 09/12/2024 1105 by Lachlan Pelto, [facility], RN Outcome: Adequate for Discharge 09/12/2024 1104 by Vivika Poythress, [facility], RN Outcome: Adequate for Discharge Goal: Ability to manage health-related needs will improve 09/12/2024 1105 by Herb Beltre, [facility], RN Outcome: Adequate for Discharge 09/12/2024 1104 by Safiyyah Vasconez, [facility], RN Outcome: Adequate for Discharge   Problem: Metabolic: Goal: Ability to maintain appropriate glucose levels will improve 09/12/2024 1105 by Mack Alvidrez, [facility], RN Outcome: Adequate for Discharge 09/12/2024 1104 by Bethani Brugger, [facility], RN Outcome: Adequate for Discharge   Problem: Nutritional: Goal: Maintenance of adequate nutrition will improve 09/12/2024 1105 by Deretha Ertle, [facility], RN Outcome: Adequate for Discharge 09/12/2024 1104 by Nicholl Onstott, [facility], RN Outcome: Adequate for Discharge Goal: Progress toward achieving an optimal weight will improve 09/12/2024 1105 by Darian Cansler, [facility], RN Outcome: Adequate for Discharge 09/12/2024 1104 by Dezirae Service, [facility], RN Outcome: Adequate for Discharge   Problem: Skin Integrity: Goal: Risk for impaired skin integrity will decrease 09/12/2024 1105 by Kieffer Blatz, [facility], RN Outcome: Adequate for Discharge 09/12/2024 1104 by [facility], RN Outcome: Adequate for Discharge   Problem: Tissue Perfusion: Goal: Adequacy of tissue perfusion  will improve 09/12/2024 1105 by Kage Willmann, [facility], RN Outcome: Adequate for Discharge 09/12/2024 1104 by [facility], RN Outcome: Adequate for Discharge   Problem: Education: Goal: Knowledge of the prescribed therapeutic regimen will improve 09/12/2024 1105 by Stephanny Tsutsui, [facility], RN Outcome: Adequate for Discharge 09/12/2024 1104 by Iretha Kirley, [facility], RN Outcome: Adequate for Discharge   Problem: Bowel/Gastric: Goal: Gastrointestinal status for postoperative course will improve 09/12/2024 1105 by Dajiah Kooi, [facility], RN Outcome: Adequate for Discharge 09/12/2024 1104 by Amyia Lodwick, [facility], RN Outcome: Adequate for Discharge   Problem: Cardiac: Goal: Ability to maintain an adequate cardiac output 09/12/2024 1105 by Aitan Rossbach, [facility], RN Outcome: Adequate for Discharge 09/12/2024 1104 by Lynsee Wands, [facility], RN Outcome: Adequate for Discharge Goal: Will show no evidence of cardiac arrhythmias 09/12/2024 1105 by Casmira Cramer, [facility], RN Outcome: Adequate for Discharge 09/12/2024 1104 by Demetria Lightsey, [facility], RN Outcome: Adequate for Discharge   Problem: Nutritional: Goal: Will attain and maintain optimal nutritional status 09/12/2024 1105 by Jaylah Goodlow, [facility], RN Outcome: Adequate for Discharge 09/12/2024 1104 by [facility], RN Outcome: Adequate for Discharge   Problem: Neurological: Goal: Will regain or maintain usual level of consciousness 09/12/2024 1105 by Kelsie Zaborowski, [facility], RN Outcome: Adequate for Discharge 09/12/2024 1104 by Maximilien Hayashi, [facility], RN Outcome: Adequate for Discharge   Problem: Clinical Measurements: Goal: Ability to maintain clinical measurements within normal limits 09/12/2024 1105 by Chitara Clonch, [facility], RN Outcome: Adequate for Discharge 09/12/2024 1104 by Trenten Watchman, [facility], RN Outcome: Adequate for Discharge Goal: Postoperative complications will be avoided or minimized 09/12/2024 1105 by Latania Bascomb, [facility], RN Outcome: Adequate for Discharge 09/12/2024 1104 by Livan Hires, [facility],  RN Outcome: Adequate for Discharge   Problem: Respiratory: Goal: Will regain and/or maintain adequate ventilation 09/12/2024 1105 by Alysha Doolan, [facility], RN Outcome: Adequate for Discharge 09/12/2024 1104 by Rutherford [facility], RN Outcome: Adequate for Discharge Goal: Respiratory status will improve 09/12/2024 1105 by Jayelyn Barno, [facility], RN  Outcome: Adequate for Discharge 09/12/2024 1104 by Tamieka Rancourt, [facility], RN Outcome: Adequate for Discharge   Problem: Skin Integrity: Goal: Demonstrates signs of wound healing without infection 09/12/2024 1105 by Lourie Retz, [facility], RN Outcome: Adequate for Discharge 09/12/2024 1104 by [facility], RN Outcome: Adequate for Discharge   Problem: Urinary Elimination: Goal: Will remain Bronislaus Verdell from infection 09/12/2024 1105 by Briane Birden, [facility], RN Outcome: Adequate for Discharge 09/12/2024 1104 by [facility], RN Outcome: Adequate for Discharge Goal: Ability to achieve and maintain adequate urine output 09/12/2024 1105 by Harshil Cavallaro, [facility], RN Outcome: Adequate for Discharge 09/12/2024 1104 by Lameka Disla, [facility], RN Outcome: Adequate for Discharge   "
